# Patient Record
Sex: Male | Born: 1935 | Race: White | Hispanic: No | Marital: Married | Smoking: Former smoker
Health system: Southern US, Community
[De-identification: ages and names within clinical notes are randomized; demographics above are authoritative.]

## PROBLEM LIST (undated history)

## (undated) DIAGNOSIS — Z974 Presence of external hearing-aid: Secondary | ICD-10-CM

## (undated) DIAGNOSIS — E785 Hyperlipidemia, unspecified: Secondary | ICD-10-CM

## (undated) DIAGNOSIS — J869 Pyothorax without fistula: Secondary | ICD-10-CM

## (undated) DIAGNOSIS — J189 Pneumonia, unspecified organism: Secondary | ICD-10-CM

## (undated) HISTORY — DX: Pneumonia, unspecified organism: J18.9

## (undated) HISTORY — DX: Pyothorax without fistula: J86.9

## (undated) HISTORY — DX: Presence of external hearing-aid: Z97.4

## (undated) HISTORY — PX: EYE SURGERY: SHX253

## (undated) SURGERY — Surgical Case
Anesthesia: *Unknown

---
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
## 1969-07-19 HISTORY — PX: APPENDECTOMY: SHX54

## 2006-07-19 HISTORY — PX: HERNIA REPAIR: SHX51

## 2009-06-04 ENCOUNTER — Ambulatory Visit: Payer: Self-pay | Admitting: Unknown Physician Specialty

## 2012-07-19 HISTORY — PX: COLONOSCOPY: SHX174

## 2012-07-27 ENCOUNTER — Ambulatory Visit: Payer: Self-pay | Admitting: Ophthalmology

## 2012-08-07 ENCOUNTER — Ambulatory Visit: Payer: Self-pay | Admitting: Ophthalmology

## 2014-11-08 NOTE — Op Note (Signed)
PATIENT NAME:  Jeffery [name], Jeffery [name] MR#:  [id number] DATE OF BIRTH:  Mar 22, 1931  DATE OF PROCEDURE:  08/07/2012  PREOPERATIVE DIAGNOSIS: Cataract, left eye.   POSTOPERATIVE DIAGNOSIS: Cataract, left eye.   PROCEDURE PERFORMED: Extracapsular cataract extraction using phacoemulsification with placement of an Alcon [id number] 16.5-diopter posterior chamber lens with 3 diopters a cylinder, serial number [id number].   SURGEON: Loura Back. Chelisa Hennen, MD   ANESTHESIA: 4% lidocaine and 0.75% Marcaine in a 50:50 mixture with 10 units/mL of Hylenex added, given as a peribulbar.   ANESTHESIOLOGIST: Dr. [place] Service   COMPLICATIONS: None.   ESTIMATED BLOOD LOSS: Less than 1 mL.   DESCRIPTION OF PROCEDURE: The patient was brought to the Operating Room. Both eyes were anesthetized with topical proparacaine, and the patient was set upright. With the patient fixing at a distant target, the 3 o'clock and 9 o'clock positions were marked on the left eye using an [employer] Toric Marker. This was reinforced using a marking pen. The patient was placed supine, given IV sedation and a peribulbar block. He was then prepped and draped in the usual fashion. Vertical rectus muscles were imbricated using 5-0 silk sutures, bridle sutures. The Toric Marker was brought to the table and had been set at 161 degrees. This was centered over the 3 o'clock and 9 o'clock positional marks, and the axis of placement of the lens was marked. The 100-degree mark was also marked for the incision. A partial thickness scleral groove was then made at the posterior surgical limbus centered on 100 degrees, and this was dissected anteriorly into clear cornea with an [employer]. The anterior chamber was entered superonasally through clear cornea with a paracentesis knife and through the lamellar dissection with a 2.6 mm keratome. DisCoVisc was used to replace the aqueous, and a continuous tear circular capsulorrhexis was carried out. Hydrodissection  was used to loosen the nucleus, and phacoemulsification was carried out in a divide and conquer technique. Total ultrasound time was 2 minutes and 9 seconds, an average power of 10.4%, CDE 47.28. Irrigation-aspiration was used to remove the residual cortex. The capsular bag was then inflated with DisCoVisc, and the intraocular lens was inserted in the capsular bag under visualization. The marks on the optic at the base of the haptics were rotated to bring the midline with the 161-degree mark. Irrigation-aspiration was used to remove the residual DisCoVisc at that point. The wound was inflated with balanced salt and Miostat injected in the paracentesis tract. There was a mild leak at the wound, so a single 10-0 nylon suture was placed across the wound. Cefuroxime, 0.1 mL, was injected into the anterior chamber through the paracentesis tract. The wounds were checked for leaks and none were found. The conjunctiva was then closed with cautery. The bridle sutures were removed, and 2 drops of Vigamox were placed in the eye. A shield was placed on the eye. The patient was discharged to the recovery room in good condition.   ____________________________ Loura Back Shardai Star, MD sad:cb D: 08/07/2012 13:36:50 ET T: 08/07/2012 14:04:04 ET JOB#: [id number]  cc: Remo Lipps A. Marolyn Urschel, MD, <Dictator> Martie Lee MD ELECTRONICALLY SIGNED 08/14/2012 13:18

## 2015-04-01 DIAGNOSIS — R011 Cardiac murmur, unspecified: Secondary | ICD-10-CM | POA: Insufficient documentation

## 2015-04-01 DIAGNOSIS — E782 Mixed hyperlipidemia: Secondary | ICD-10-CM | POA: Insufficient documentation

## 2016-02-25 DIAGNOSIS — M25519 Pain in unspecified shoulder: Secondary | ICD-10-CM | POA: Insufficient documentation

## 2016-02-25 DIAGNOSIS — M7540 Impingement syndrome of unspecified shoulder: Secondary | ICD-10-CM | POA: Insufficient documentation

## 2016-03-24 ENCOUNTER — Other Ambulatory Visit: Payer: Self-pay | Admitting: Internal Medicine

## 2016-03-24 DIAGNOSIS — R0989 Other specified symptoms and signs involving the circulatory and respiratory systems: Secondary | ICD-10-CM

## 2016-03-26 ENCOUNTER — Ambulatory Visit
Admission: RE | Admit: 2016-03-26 | Discharge: 2016-03-26 | Disposition: A | Discharge status: Home / Self Care | Payer: Medicare Other | Source: Ambulatory Visit | Attending: Internal Medicine | Admitting: Internal Medicine

## 2016-03-26 DIAGNOSIS — R0989 Other specified symptoms and signs involving the circulatory and respiratory systems: Secondary | ICD-10-CM | POA: Diagnosis present

## 2016-03-26 DIAGNOSIS — I6523 Occlusion and stenosis of bilateral carotid arteries: Secondary | ICD-10-CM | POA: Diagnosis not present

## 2016-04-27 ENCOUNTER — Other Ambulatory Visit: Payer: Self-pay | Admitting: Internal Medicine

## 2016-04-27 ENCOUNTER — Ambulatory Visit
Admission: RE | Admit: 2016-04-27 | Discharge: 2016-04-27 | Disposition: A | Discharge status: Home / Self Care | Payer: Medicare Other | Source: Ambulatory Visit | Attending: Internal Medicine | Admitting: Internal Medicine

## 2016-04-27 DIAGNOSIS — R05 Cough: Secondary | ICD-10-CM | POA: Insufficient documentation

## 2016-04-27 DIAGNOSIS — J9811 Atelectasis: Secondary | ICD-10-CM | POA: Insufficient documentation

## 2016-04-27 DIAGNOSIS — M47814 Spondylosis without myelopathy or radiculopathy, thoracic region: Secondary | ICD-10-CM | POA: Diagnosis not present

## 2016-04-27 DIAGNOSIS — R918 Other nonspecific abnormal finding of lung field: Secondary | ICD-10-CM | POA: Diagnosis not present

## 2016-04-27 DIAGNOSIS — R059 Cough, unspecified: Secondary | ICD-10-CM

## 2016-05-11 ENCOUNTER — Ambulatory Visit
Admission: RE | Admit: 2016-05-11 | Discharge: 2016-05-11 | Disposition: A | Discharge status: Home / Self Care | Payer: Medicare Other | Source: Ambulatory Visit | Attending: Internal Medicine | Admitting: Internal Medicine

## 2016-05-11 ENCOUNTER — Other Ambulatory Visit: Payer: Self-pay | Admitting: Internal Medicine

## 2016-05-11 DIAGNOSIS — R05 Cough: Secondary | ICD-10-CM | POA: Insufficient documentation

## 2016-05-11 DIAGNOSIS — J984 Other disorders of lung: Secondary | ICD-10-CM | POA: Diagnosis not present

## 2016-05-11 DIAGNOSIS — R059 Cough, unspecified: Secondary | ICD-10-CM

## 2016-05-25 ENCOUNTER — Other Ambulatory Visit: Payer: Self-pay | Admitting: Internal Medicine

## 2016-05-25 ENCOUNTER — Ambulatory Visit
Admission: RE | Admit: 2016-05-25 | Discharge: 2016-05-25 | Disposition: A | Discharge status: Home / Self Care | Payer: Medicare Other | Source: Ambulatory Visit | Attending: Internal Medicine | Admitting: Internal Medicine

## 2016-05-25 DIAGNOSIS — J90 Pleural effusion, not elsewhere classified: Secondary | ICD-10-CM | POA: Diagnosis not present

## 2016-05-25 DIAGNOSIS — R059 Cough, unspecified: Secondary | ICD-10-CM

## 2016-05-25 DIAGNOSIS — J988 Other specified respiratory disorders: Secondary | ICD-10-CM | POA: Insufficient documentation

## 2016-05-25 DIAGNOSIS — R05 Cough: Secondary | ICD-10-CM

## 2016-05-31 NOTE — Progress Notes (Signed)
[facility] Consultation      Assessment and Plan:  Empyema. -View chest x-ray images with patient and his wife. -There is residual pleural fluid, which may be empyema. -We will take the patient to special procedures and perform ultrasound, if there is significant amount of fluid, we will perform a thoracentesis  Pleural effusion. -As above. Drain pleural effusion, if present.  Dyspnea. -Dyspnea, likely secondary to restriction from the pleural effusion. -Pleuritic chest pain likely secondary to effusion.  Pneumonia. -This appears to have improved, I suspect that this was the inciting event for the pleural effusion.   Date: 05/31/2016  MRN# [id number] ARJAN STANTON 20-Sep-1930  Referring Physician:   STATLER BROAS is a 82 y.o. old male seen in consultation for chief complaint of:    Chief Complaint  Patient presents with  . pulmonary consult    per Dr. Hall Busing. pt dx with PNA about 8 weeks ago, pt had has three rounds of abx and 1 round of prednisone. pt currently c/o sob and voice hoarseness     HPI:   Dimitrius Cottam is an 82 yo male. About 8 weeks ago he had a lot of severe coughing and right sided lateral chest pain. He is here today with his wife who gives some of the history. They called docs office and had a home visit. He was diagnosed with pneumonia, he was given a course of abx, then had a CXR which was apparently clear. He was given a cough syrup which made him hallucinate.  Images reviewed: 05/24/16; small to moderate right pleural effusion with atelectasis, not seen previously.  He has since been taking vertussin AC, mucinex, and bactrim. He notes that he continues to feel week, but the coughing is better. The pain in the side has improved.   He is retired from working in [employer], last worked about 24 years ago; he was a Personnel officer.   PMHX:   Past Medical History:  Diagnosis Date  . Pneumonia    Surgical Hx:  Past Surgical History:    Procedure Laterality Date  . APPENDECTOMY    . TONSILLECTOMY     Family Hx:  Family History  Problem Relation Age of Onset  . Heart attack Father   . ALS Sister   . Cancer Sister   . ALS Sister    Social Hx:   Social History  Substance Use Topics  . Smoking status: Not on file  . Smokeless tobacco: Not on file  . Alcohol use Not on file   Medication:       Allergies:  Patient has no allergy information on record.  Review of Systems: Gen:  Denies  fever, sweats, chills HEENT: Denies blurred vision, double vision. bleeds, sore throat Cvc:  No dizziness, chest pain. Resp:   Denies cough or sputum production, shortness of breath Gi: Denies swallowing difficulty, stomach pain. Gu:  Denies bladder incontinence, burning urine Ext:   No Joint pain, stiffness. Skin: No skin rash,  hives  Endoc:  No polyuria, polydipsia. Psych: No depression, insomnia. Other:  All other systems were reviewed with the patient and were negative other that what is mentioned in the HPI.   Physical Examination:   VS: BP 124/60 (BP Location: Left Arm, Cuff Size: Normal)   Pulse (!) 116   Ht 5\' 10"  (1.778 m)   Wt 117 lb 3.2 oz (53.2 kg)   SpO2 93%   BMI 16.82 kg/m   General Appearance: No distress  Neuro:without focal findings,  speech normal,  HEENT: PERRLA, EOM intact.   Pulmonary: normal breath sounds, decreased air entry in the right base, with dullness to percussion in the right base. CardiovascularNormal S1,S2.  No m/r/g.   Abdomen: Benign, Soft, non-tender. Renal:  No costovertebral tenderness  GU:  No performed at this time. Endoc: No evident thyromegaly, no signs of acromegaly. Skin:   warm, no rashes, no ecchymosis  Extremities: normal, no cyanosis, clubbing.  Other findings:    LABORATORY PANEL:   CBC No results for input(s): WBC, HGB, HCT, PLT in the last 168  hours. ------------------------------------------------------------------------------------------------------------------  Chemistries  No results for input(s): NA, K, CL, CO2, GLUCOSE, BUN, CREATININE, CALCIUM, MG, AST, ALT, ALKPHOS, BILITOT in the last 168 hours.  Invalid input(s): GFRCGP ------------------------------------------------------------------------------------------------------------------  Cardiac Enzymes No results for input(s): TROPONINI in the last 168 hours. ------------------------------------------------------------  RADIOLOGY:  No results found.     Thank  you for the consultation and for allowing [facility] to assist in the care of your patient. Our recommendations are noted above.  Please contact us if we can be of further service.   [name], MD.  Board Certified in Internal Medicine, Pulmonary Medicine, [place], and Sleep Medicine.  [facility] Office Number: [phone]  Patricia Pesa, M.D.  Vilinda Boehringer, M.D.  Merton Border, M.D  05/31/2016

## 2016-06-01 ENCOUNTER — Encounter (INDEPENDENT_AMBULATORY_CARE_PROVIDER_SITE_OTHER): Payer: Self-pay

## 2016-06-01 ENCOUNTER — Encounter: Payer: Self-pay | Admitting: Internal Medicine

## 2016-06-01 ENCOUNTER — Ambulatory Visit (INDEPENDENT_AMBULATORY_CARE_PROVIDER_SITE_OTHER): Payer: Medicare Other | Admitting: Internal Medicine

## 2016-06-01 VITALS — BP 124/60 | HR 116 | Ht 70.0 in | Wt 117.2 lb

## 2016-06-01 DIAGNOSIS — J869 Pyothorax without fistula: Secondary | ICD-10-CM | POA: Diagnosis not present

## 2016-06-01 NOTE — Patient Instructions (Addendum)
--  Will schedule diagnostic/therapeutic thoracentesis in special procedures.

## 2016-06-03 ENCOUNTER — Other Ambulatory Visit: Payer: Self-pay | Admitting: Internal Medicine

## 2016-06-03 ENCOUNTER — Ambulatory Visit
Admission: RE | Admit: 2016-06-03 | Discharge: 2016-06-03 | Disposition: A | Discharge status: Home / Self Care | Payer: Medicare Other | Source: Ambulatory Visit | Attending: Internal Medicine | Admitting: Internal Medicine

## 2016-06-03 ENCOUNTER — Telehealth: Payer: Self-pay | Admitting: *Deleted

## 2016-06-03 ENCOUNTER — Encounter: Payer: Self-pay | Admitting: Internal Medicine

## 2016-06-03 DIAGNOSIS — Z9889 Other specified postprocedural states: Secondary | ICD-10-CM

## 2016-06-03 DIAGNOSIS — J90 Pleural effusion, not elsewhere classified: Secondary | ICD-10-CM | POA: Diagnosis present

## 2016-06-03 DIAGNOSIS — J869 Pyothorax without fistula: Secondary | ICD-10-CM | POA: Diagnosis not present

## 2016-06-03 HISTORY — DX: Pyothorax without fistula: J86.9

## 2016-06-03 LAB — BASIC METABOLIC PANEL
Anion gap: 7 (ref 5–15)
BUN: 8 mg/dL (ref 6–20)
CHLORIDE: 98 mmol/L — AB (ref 101–111)
CO2: 28 mmol/L (ref 22–32)
CREATININE: 0.72 mg/dL (ref 0.61–1.24)
Calcium: 9.2 mg/dL (ref 8.9–10.3)
GFR calc non Af Amer: >60 mL/min (ref >60)
Glucose, Bld: 104 mg/dL — ABNORMAL HIGH (ref 65–99)
POTASSIUM: 4.3 mmol/L (ref 3.5–5.1)
Sodium: 133 mmol/L — ABNORMAL LOW (ref 135–145)

## 2016-06-03 LAB — CBC
HEMATOCRIT: 38.2 % — AB (ref 40.0–52.0)
HEMOGLOBIN: 12.7 g/dL — AB (ref 13.0–18.0)
MCH: 28.2 pg (ref 26.0–34.0)
MCHC: 33.2 g/dL (ref 32.0–36.0)
MCV: 85 fL (ref 80.0–100.0)
Platelets: 430 10*3/uL (ref 150–440)
RBC: 4.5 MIL/uL (ref 4.40–5.90)
RDW: 13.7 % (ref 11.5–14.5)
WBC: 13.1 10*3/uL — ABNORMAL HIGH (ref 3.8–10.6)

## 2016-06-03 LAB — LIPID PANEL
CHOLESTEROL: 155 mg/dL (ref 0–200)
HDL: 34 mg/dL — ABNORMAL LOW (ref >40)
LDL Cholesterol: 108 mg/dL — ABNORMAL HIGH (ref 0–99)
TRIGLYCERIDES: 64 mg/dL (ref <150)
Total CHOL/HDL Ratio: 4.6 RATIO
VLDL: 13 mg/dL (ref 0–40)

## 2016-06-03 LAB — BODY FLUID CELL COUNT WITH DIFFERENTIAL
Eos, Fluid: 0 %
Lymphs, Fluid: 40 %
Monocyte-Macrophage-Serous Fluid: 4 %
Neutrophil Count, Fluid: 54 %
Other Cells, Fluid: 2 %
Total Nucleated Cell Count, Fluid: 53103 cu mm

## 2016-06-03 LAB — AMYLASE: Amylase: 48 U/L (ref 28–100)

## 2016-06-03 LAB — LACTATE DEHYDROGENASE, PLEURAL OR PERITONEAL FLUID: LD FL: 3067 U/L — AB (ref 3–23)

## 2016-06-03 MED ORDER — LACTATED RINGERS IV SOLN
INTRAVENOUS | Status: DC
  Administered 2016-06-03: 13:00:00 via INTRAVENOUS

## 2016-06-03 NOTE — Telephone Encounter (Signed)
LMOVM for pt to call back to schedule OV f/u with CXR prior.

## 2016-06-03 NOTE — OR Nursing (Signed)
Patient reports feeling better and breathing better after fluid was drained off.

## 2016-06-03 NOTE — Telephone Encounter (Signed)
-----   Message from Laverle Hobby, MD sent at 06/03/2016  2:40 PM EST ----- Regarding: fu on thoracentesis.  Pt needs CXR and follow up from his thoracentesis, both in 2-3 weeks.

## 2016-06-03 NOTE — OR Nursing (Signed)
Thoracentesis right chest performed by Dr. Ashby Dawes with removall of 100 mls milky tan frothy drainage. Patient tolerated the procedure well. Specimens sent to lab. Portable CXR performed and reported to be cleared by Dr. Ashby Dawes. He has cleared patient for discharge to home.

## 2016-06-03 NOTE — Progress Notes (Signed)
Pre-op Chest ultrasound: Chest ultrasound was performed on the posterior hemithorax in mid scapular line, there was moderate pleural effusion noted with significant loculations. The effusion extended 2/3 way up the chest wall.  An are in the mid scapular line approximately in the 9th interspace was identified. This area was marked, prepped and draped in sterile fashion.   PROCEDURE NOTE: THORACENTESIS   Indication: Pleural effusion/ Empyema  A time-out was completed verifying correct patient, procedure, site, positioning, and implant(s) or special equipment if applicable.  Ultrasound guidance was  used and appropriate fluid pocket was identified and marked.   Patient was positioned, prepped and draped in usual sterile fashion. 1% Lidocaine was used to anesthetize the area and infiltrated down to the pleural space, pleural fluid was seen upon aspiration.  An introducer needle was introduced into the pleural space and fluid was removed as the catheter was advanced Blood loss was none.  Approximately 150 cc of grossly thick purulent fluid was removed until drainage stopped. Fluid was sent for testing, the patient noted immediate improvement in his breathing.   A chest xray was ordered to evaluate for pneumothorax.    Patient tolerated the procedure well  and there were no complications noted. The patient was comfortable for the duration of the procedure, was awake, alert, conversant.   [name], M.D. Pulmonary & Critical Care Medicine

## 2016-06-04 LAB — PATHOLOGIST SMEAR REVIEW

## 2016-06-04 NOTE — Telephone Encounter (Signed)
appt scheduled with DS since DR not available. Pt and wife informed and that CXR is needed.

## 2016-06-08 LAB — BODY FLUID CULTURE: Special Requests: NORMAL

## 2016-06-08 LAB — LIPASE, FLUID: LIPASE-FLUID: 16 U/L

## 2016-06-09 NOTE — Procedures (Signed)
PROCEDURE NOTE: THORACENTESIS with ultrasound guidance.   Chest Ultrasound:  A chest ultrasound was performed in mid scapular line, from the base to the apex bilaterally. There was a loculated pleural effusion on the right side approximately 2/3 rds way up the chest wall. The left lung was clear. An area in the mid scapular line in approximately the 9th interspace was identified as most ideal this was marked, prepped and draped in sterile fashin.   MRN# [id number] Jeffery [name]   Preop diagnosis: Loculated pleural effusion Postop diagnosis: Empyema.   Indication: Pleural effusion/diagnsyic/therapeutic/ Empyema  A time-out was completed verifying correct patient, procedure, site, positioning, and implant(s) or special equipment if applicable.  Ultrasound guidance was  used and appropriate fluid pocket was identified and marked.   Patient was positioned, prepped and draped in usual sterile fashion. 1% Lidocaine was used to anesthetize the area.  A  needle was introduced into the pleural space and approximately 150 cc of dark thick fluid was removed. Blood loss was nil.   A chest xray was ordered to evaluate for pneumothorax.  Total Fluid Removed: 150cc Color of Fluid: brown, turbid.  Sent for: Cell Count Gram Stain Cultures LDH Glucose pH Cytology  Patient tolerated the procedure well and there were no complications.  [name], M.D. Pulmonary & Critical Care Medicine 06/07/16

## 2016-06-17 ENCOUNTER — Ambulatory Visit
Admission: RE | Admit: 2016-06-17 | Discharge: 2016-06-17 | Disposition: A | Discharge status: Home / Self Care | Payer: Medicare Other | Source: Ambulatory Visit | Attending: Pulmonary Disease | Admitting: Pulmonary Disease

## 2016-06-17 ENCOUNTER — Ambulatory Visit
Admission: RE | Admit: 2016-06-17 | Discharge: 2016-06-17 | Disposition: A | Discharge status: Home / Self Care | Payer: Medicare Other | Source: Ambulatory Visit | Attending: Diagnostic Radiology | Admitting: Diagnostic Radiology

## 2016-06-17 ENCOUNTER — Other Ambulatory Visit: Payer: Self-pay | Admitting: *Deleted

## 2016-06-17 ENCOUNTER — Inpatient Hospital Stay
Admission: RE | Admit: 2016-06-17 | Discharge: 2016-06-22 | DRG: 177 | Disposition: A | Discharge status: Home Health | Payer: Medicare Other | Source: Ambulatory Visit | Attending: Internal Medicine | Admitting: Internal Medicine

## 2016-06-17 ENCOUNTER — Other Ambulatory Visit: Payer: Self-pay | Admitting: Pulmonary Disease

## 2016-06-17 ENCOUNTER — Ambulatory Visit (INDEPENDENT_AMBULATORY_CARE_PROVIDER_SITE_OTHER): Payer: Medicare Other | Admitting: Pulmonary Disease

## 2016-06-17 ENCOUNTER — Encounter: Payer: Self-pay | Admitting: Pulmonary Disease

## 2016-06-17 ENCOUNTER — Encounter: Payer: Self-pay | Admitting: Critical Care Medicine

## 2016-06-17 ENCOUNTER — Inpatient Hospital Stay: Admission: AD | Admit: 2016-06-17 | Payer: Medicare Other | Source: Ambulatory Visit

## 2016-06-17 VITALS — BP 126/74 | HR 107 | Ht 70.0 in | Wt 173.6 lb

## 2016-06-17 DIAGNOSIS — J90 Pleural effusion, not elsewhere classified: Secondary | ICD-10-CM

## 2016-06-17 DIAGNOSIS — Z87891 Personal history of nicotine dependence: Secondary | ICD-10-CM

## 2016-06-17 DIAGNOSIS — J939 Pneumothorax, unspecified: Secondary | ICD-10-CM

## 2016-06-17 DIAGNOSIS — J869 Pyothorax without fistula: Secondary | ICD-10-CM | POA: Diagnosis present

## 2016-06-17 DIAGNOSIS — J438 Other emphysema: Secondary | ICD-10-CM

## 2016-06-17 DIAGNOSIS — J13 Pneumonia due to Streptococcus pneumoniae: Secondary | ICD-10-CM | POA: Diagnosis present

## 2016-06-17 HISTORY — DX: Hyperlipidemia, unspecified: E78.5

## 2016-06-17 LAB — BODY FLUID CELL COUNT WITH DIFFERENTIAL
Eos, Fluid: 0 %
Lymphs, Fluid: 56 %
Monocyte-Macrophage-Serous Fluid: 18 %
NEUTROPHIL FLUID: 26 %
OTHER CELLS FL: 0 %
Total Nucleated Cell Count, Fluid: 299580 cu mm

## 2016-06-17 LAB — LACTATE DEHYDROGENASE, PLEURAL OR PERITONEAL FLUID

## 2016-06-17 LAB — PROTEIN, BODY FLUID: Total protein, fluid: 4.3 g/dL

## 2016-06-17 LAB — AMYLASE: AMYLASE: 36 U/L (ref 28–100)

## 2016-06-17 LAB — GLUCOSE, SEROUS FLUID: Glucose, Fluid: <20 mg/dL

## 2016-06-17 MED ORDER — IOPAMIDOL (ISOVUE-300) INJECTION 61%
75.0000 mL | Freq: Once | INTRAVENOUS | Status: AC | PRN
  Administered 2016-06-17: 75 mL via INTRAVENOUS

## 2016-06-17 MED ORDER — AMOXICILLIN-POT CLAVULANATE 875-125 MG PO TABS: 1.0000 | ORAL_TABLET | Freq: Two times a day (BID) | ORAL | 0 refills | Status: DC

## 2016-06-17 MED ORDER — TRAZODONE HCL 50 MG PO TABS
25.0000 mg | ORAL_TABLET | Freq: Every day | ORAL | Status: DC
  Administered 2016-06-17 – 2016-06-21 (×5): 25 mg via ORAL
  Filled 2016-06-17 (×5): qty 1

## 2016-06-17 MED ORDER — ACETAMINOPHEN 325 MG PO TABS
650.0000 mg | ORAL_TABLET | Freq: Four times a day (QID) | ORAL | Status: DC | PRN
  Administered 2016-12-01: 1000 mg via ORAL

## 2016-06-17 MED ORDER — ACETAMINOPHEN 325 MG PO TABS
650.0000 mg | ORAL_TABLET | ORAL | Status: DC | PRN
  Administered 2016-06-18: 01:00:00 650 mg via ORAL
  Filled 2016-06-17: qty 2

## 2016-06-17 MED ORDER — ENOXAPARIN SODIUM 40 MG/0.4ML ~~LOC~~ SOLN
40.0000 mg | SUBCUTANEOUS | Status: DC
  Administered 2016-06-19 – 2016-06-22 (×4): 40 mg via SUBCUTANEOUS
  Filled 2016-06-17 (×4): qty 0.4

## 2016-06-17 MED ORDER — ONDANSETRON HCL 4 MG/2ML IJ SOLN
4.0000 mg | Freq: Four times a day (QID) | INTRAMUSCULAR | Status: DC | PRN
Start: 2016-06-17 — End: 2017-06-18

## 2016-06-17 MED ORDER — ONDANSETRON HCL 4 MG/2ML IJ SOLN: 4.0000 mg | Freq: Four times a day (QID) | INTRAMUSCULAR | Status: DC | PRN

## 2016-06-17 MED ORDER — DEXTROSE 5 % IV SOLN
5.0000 10*6.[IU] | Freq: Four times a day (QID) | INTRAVENOUS | Status: DC
  Filled 2016-06-17 (×3): qty 5

## 2016-06-17 MED ORDER — PENICILLIN G POTASSIUM 5000000 UNITS IJ SOLR
5.0000 10*6.[IU] | Freq: Four times a day (QID) | INTRAVENOUS | Status: DC
  Administered 2016-06-18 – 2016-06-22 (×17): 5 10*6.[IU] via INTRAVENOUS
  Filled 2016-06-17 (×20): qty 5

## 2016-06-17 MED ORDER — GUAIFENESIN-DM 100-10 MG/5ML PO SYRP
5.0000 mL | ORAL_SOLUTION | ORAL | Status: DC | PRN
  Administered 2016-06-18: 5 mL via ORAL
  Filled 2016-06-17: qty 5

## 2016-06-17 NOTE — H&P (Signed)
Name: Jeffery [name] MRN: [id number] DOB: 15-Mar-1931    ADMISSION DATE:  06/17/2016 CONSULTATION DATE:  06/17/2016  REFERRING MD :  Dr. Anselm Pancoast  CHIEF COMPLAINT:  Right sided empyema  BRIEF PATIENT DESCRIPTION: 82 yo male presented to [facility] on 11/30 for a right sided thoracentesis due to a large posterior loculated pleural effusion with the removal of 100 ml brown purulent fluid. However, due to the inability to drain the right sided pleural effusion thoroughly he will need CT biopsy with placement of drainage tube or catheter placement with instillation of thrombolytic agents.   SIGNIFICANT EVENTS  11/30-US Guided Thoracentesis for right sided empyema with removal of 100 ml brown purulent fluid 11/30-Pt admitted to [facility] due to need of CT biopsy for right sided chest tube placement with instillation of thrombolytic agents   STUDIES:  CT Chest 11/30>>Large posterior loculated pleural effusion on the right measures 11 x 6 cm compatible with empyema. Small gas bubble likely due to recent thoracentesis. Compressive atelectasis/ infiltrate in the right lower lobe with a small amount of infiltrate in the right upper lobe. 1 cm patchy density left lower lobe posteriorly may represent scar or infiltrate. Mild splenomegaly. Coronary artery calcification. No definite underlying mass lesion identified.  HISTORY OF PRESENT ILLNESS:   This is an 82 yo male with a PMH of Hyperlipidemia and Pneumonia.  He was referred by Dr. Hall Busing to Pulmonologist Dr. Ashby Dawes for a consultation on 11/14 due to Pneumonia diagnosis 8 weeks prior to outpatient pulmonology consultation .  The pt had a total of three rounds of abx and 1 round of prednisone total due to c/o persistent cough and dyspnea, however he still c/o shortness of breath and voice hoarseness despite treatments prompting pulmonology consultation.  Chest xray on 11/6 revealed small to moderate right pleural effusion with atelectasis that was not seen on  previous xrays.  There was suspicion of a right sided empyema, therefore Dr. Ashby Dawes recommended a right sided thoracentesis.  The thoracentesis was performed on 11/16 with the removal of 150 ml of dark thick fluid, and following procedure an outpatient follow-up appointment was scheduled with pulmonology on 11/30. During follow-up appointment with Pulmonologist Dr. Alva Garnet on 11/30 a CT Chest was ordered that revealed a large posterior loculated pleural effusion, therefore a repeat right sided thoracentesis was performed by IR on 11/30 with drainage of 100 ml brown purulent fluid.  However, due to the inability to drain the right sided pleural effusion thoroughly the pt subsequently required admission to [facility] for a CT biopsy with placement of a right sided drainage tube or catheter placement with plans for instillation of thrombolytic agents (procedure planned for 12/1).    PAST MEDICAL HISTORY :   has a past medical history of Hyperlipidemia and Pneumonia.  has a past surgical history that includes Appendectomy and Tonsillectomy. Prior to Admission medications   Medication Sig Start Date End Date Taking? Authorizing Provider  amoxicillin-clavulanate (AUGMENTIN) 875-125 MG tablet Take 1 tablet by mouth 2 (two) times daily. 06/17/16 07/01/16  Wilhelmina Mcardle, MD  guaiFENesin (MUCINEX) 600 MG 12 hr tablet Take by mouth 2 (two) times daily.    Historical Provider, MD  Selena Lesser A/C 100-10 MG/5ML syrup  05/18/16   Historical Provider, MD   No Known Allergies  FAMILY HISTORY:  family history includes ALS in his sister and sister; Cancer in his sister; Heart attack in his father. SOCIAL HISTORY:  reports that he has quit smoking. His smoking use included Cigarettes. He  has a 10.00 pack-year smoking history. He has never used smokeless tobacco. He reports that he drinks alcohol. He reports that he does not use drugs.  REVIEW OF SYSTEMS:  Positives in BOLD Constitutional: Negative for fever, chills,  weight loss, malaise/fatigue and diaphoresis.  HENT: Negative for hearing loss, ear pain, nosebleeds, congestion, sore throat, neck pain, tinnitus and ear discharge.   Eyes: Negative for blurred vision, double vision, photophobia, pain, discharge and redness.  Respiratory: Negative for cough, hemoptysis, sputum production, shortness of breath, wheezing and stridor.   Cardiovascular: Negative for chest pain, palpitations, orthopnea, claudication, leg swelling and PND.  Gastrointestinal: Negative for heartburn, nausea, vomiting, abdominal pain, diarrhea, constipation, blood in stool and melena.  Genitourinary: Negative for dysuria, urgency, frequency, hematuria and flank pain.  Musculoskeletal: Negative for myalgias, back pain, joint pain and falls.  Skin: Negative for itching and rash.  Neurological: Negative for dizziness, tingling, tremors, sensory change, speech change, focal weakness, seizures, loss of consciousness, weakness and headaches.  Endo/Heme/Allergies: Negative for environmental allergies and polydipsia. Does not bruise/bleed easily.  SUBJECTIVE:  Pt currently states he feels great does not have any complaints at this time.  VITAL SIGNS: Temp:  [97.9 F (36.6 C)-99.4 F (37.4 C)] 99.4 F (37.4 C) (11/30 1958) Pulse Rate:  [105-121] 121 (11/30 1958) Resp:  [18-20] 18 (11/30 1958) BP: (109-135)/(64-90) 119/65 (11/30 1958) SpO2:  [95 %-97 %] 96 % (11/30 1958) Weight:  [169 lb 8 oz (76.9 kg)-173 lb 9.6 oz (78.7 kg)] 169 lb 8 oz (76.9 kg) (11/30 1958)  PHYSICAL EXAMINATION: General:  Well developed, well nourished Caucasian male  Neuro:  Alert and oriented, follows commands, PERRLA HEENT:  Supple, no JVD Cardiovascular: Sinus tach, s1s2, no M/R/G Lungs:  Diminished right side, clear on left side, even, non labored Abdomen: +BS x4, soft, non tender, non distended Musculoskeletal:  Normal bulk and tone  Skin:  No rashes or lesions  No results for input(s): NA, K, CL, CO2,  BUN, CREATININE, GLUCOSE in the last 168 hours. No results for input(s): HGB, HCT, WBC, PLT in the last 168 hours. Dg Chest 1 View  Result Date: 06/17/2016 CLINICAL DATA:  Followup right thoracentesis. EXAM: CHEST 1 VIEW COMPARISON:  Earlier same day FINDINGS: Heart size is normal. Left chest remains clear. Similar radiographic appearance of right lower chest density. No pneumothorax. IMPRESSION: Persistent right chest density on this one view, little changed. No pneumothorax. Electronically Signed   By: Nelson Chimes M.D.   On: 06/17/2016 16:41   Dg Chest 2 View  Result Date: 06/17/2016 CLINICAL DATA:  Status post thoracentesis 2 weeks ago. Patient has diagnosis of pneumonia. Patient reports fairly stable shortness of breath and other symptoms. Remote history of smoking. EXAM: CHEST  2 VIEW COMPARISON:  Post thoracentesis radiograph of June 03, 2016 and pre thoracentesis PA and lateral chest x-ray of May 25, 2016 FINDINGS: The right lower lobe is completely opacified. The right middle and upper lobes are grossly clear. The left lung is clear. There is no left-sided pleural effusion. The heart and pulmonary vascularity are normal. The mediastinum is normal in width. The bony thorax is unremarkable. IMPRESSION: Probable interval reaccumulation of large right pleural effusion. This could be verified with directed ultrasound. Density throughout the right lower lobe is consistent with atelectasis or pneumonia. These results will be called to the ordering clinician or representative by the Radiologist Assistant, and communication documented in the PACS or zVision Dashboard. Electronically Signed   By: Calvin Chura  [place] M.D.  On: 06/17/2016 11:20   Ct Chest W Contrast  Result Date: 06/17/2016 CLINICAL DATA:  Pneumonia, empyema EXAM: CT CHEST WITH CONTRAST TECHNIQUE: Multidetector CT imaging of the chest was performed during intravenous contrast administration. CONTRAST:  69mL ISOVUE-300 IOPAMIDOL  (ISOVUE-300) INJECTION 61% COMPARISON:  Chest two-view 06/17/2016 FINDINGS: Cardiovascular: Thoracic aorta within normal limits. Pulmonary arteries normal. Coronary artery calcification. Aortic valve calcification. Negative for pericardial effusion. Mediastinum/Nodes: No pathologic adenopathy. Fluid in the superior pericardial recess noted. Lungs/Pleura: Large posterior loculated pleural effusion on the right measuring 11 x 6 cm. Small gas bubble within the fluid may be related to thoracentesis. There is compressive atelectasis/ infiltrate in the right lower lobe. Small amount of infiltrate is present in the right upper lobe posteriorly and laterally. Subpleural patchy density left lower lobe measuring under 1 cm, possibly an area of scar or infiltrate. No pleural effusion on the left Upper Abdomen: Small low-density lesions in the left lobe liver possibly cysts. Low-density in the lateral left liver adjacent the falciform ligament likely due to fatty liver. Mild splenomegaly. 1 cm right renal stone. Musculoskeletal: Moderately severe compression fracture L1 appears chronic. No acute skeletal abnormality. IMPRESSION: Large posterior loculated pleural effusion on the right measures 11 x 6 cm compatible with empyema. Small gas bubble likely due to recent thoracentesis. Compressive atelectasis/ infiltrate in the right lower lobe with a small amount of infiltrate in the right upper lobe. 1 cm patchy density left lower lobe posteriorly may represent scar or infiltrate. Mild splenomegaly Coronary artery calcification No definite underlying mass lesion identified. Electronically Signed   By: Franchot Gallo M.D.   On: 06/17/2016 12:09   US Thoracentesis Asp Pleural Space W/img Guide  Result Date: 06/17/2016 INDICATION: 82 year old with right pleural effusion, bacterial. EXAM: ULTRASOUND GUIDED RIGHT THORACENTESIS MEDICATIONS: None. COMPLICATIONS: None immediate. PROCEDURE: An ultrasound guided thoracentesis was  thoroughly discussed with the patient and questions answered. The benefits, risks, alternatives and complications were also discussed. The patient understands and wishes to proceed with the procedure. Written consent was obtained. Ultrasound was performed to localize and mark an adequate pocket of fluid in the right posterior chest. The area was then prepped and draped in the normal sterile fashion. 1% Lidocaine was used for local anesthesia. Under ultrasound guidance a 19 gauge, 10-cm, Yueh catheter was introduced. Thoracentesis was performed. The catheter was removed and a dressing applied. FINDINGS: A total of approximately 100 mL of brown purulent fluid was removed. Samples were sent to the laboratory as requested by the clinical team. Right pleural fluid is severely loculated. IMPRESSION: Successful ultrasound guided right thoracentesis yielding 100 mL of pleural fluid. Right pleural effusion is compatible with an empyema. Electronically Signed   By: Markus Daft M.D.   On: 06/17/2016 16:02    ASSESSMENT / PLAN: Large posterior loculated pleural effusion on the right measures 11 x 6 cm compatible with empyema S/p Right sided thoracentesis on 11/30 P: Supplemental O2 as needed to maintain O2 sats >92% or above Plans for CT biopsy with placement of drainage tube or catheter placement with instillation of thrombolytic agents-12/1 NPO after midnight for CT biopsy Start Penicillin G CXR in am  PT/INR and PTT in am prior to CT biopsy  [name], [place] Pager [phone] (please enter 7 digits) PCCM Consult Pager [phone] (please enter 7 digits)  PCCM MD ATTESTATION:  PT SUMMARY: Treated for PNA a few weeks ago Developed pleural effusion noted on CXR 05/25/16 Saw Dr Ashby Dawes 06/01/16 in consultation Thoracentesis  06/03/16 - 150 cc of fluid described as brown/turbid             Pleural fluid with high WBC, LDH and GS positive for microaerophilic  strep   CXR: Increased size of R pleural effusion which appears loculated  CT chest reviewed  IMPRESSION: Empyema - this is now subacute but the fluid from 2 weeks ago had microaerophilic strep species and was very inflamed (very elevated LDH and WBC). It is striking how good he looks and I am surprised that he does not have fever. Nonetheless, this needs to be more thoroughly drained and he needs to be on antibiotics until this process is fully resolved  PLAN/REC: I have discussed with Dr Anselm Pancoast of IR. We will proceed with CT guided pleural catheter placement for intra-pleural tPA + alpha dornase. It is likely that this will need to be repeated per the usual protocol - q 12 hrs for 3 days. Hopefully we can avoid more aggressive interventions such as VATS   Merton Border, MD PCCM service Mobile [phone] Pager [phone] 06/17/2016

## 2016-06-17 NOTE — Progress Notes (Signed)
Patient arrived to 115. No complaints of pain. NT at bedside retrieving vital signs. Madlyn Frankel, RN

## 2016-06-17 NOTE — H&P (Signed)
Name: Jeffery [name] MRN: [id number] DOB: 01/09/1931    ADMISSION DATE:  (Not on file) CONSULTATION DATE:  06/17/2016  REFERRING MD :  Dr. Anselm Pancoast  CHIEF COMPLAINT:  Right sided empyema  BRIEF PATIENT DESCRIPTION: 82 yo male presented to [facility] on 11/30 for a right sided thoracentesis due to a large posterior loculated pleural effusion with the removal of 100 ml brown purulent fluid. However, due to the inability to drain the right sided pleural effusion thoroughly he will need CT biopsy with placement of drainage tube or catheter placement with instillation of thrombolytic agents.   SIGNIFICANT EVENTS  11/30-US Guided Thoracentesis for right sided empyema with removal of 100 ml brown purulent fluid 11/30-Pt admitted to [facility] due to need of CT biopsy for right sided chest tube placement with instillation of thrombolytic agents   STUDIES:  CT Chest 11/30>>Large posterior loculated pleural effusion on the right measures 11 x 6 cm compatible with empyema. Small gas bubble likely due to recent thoracentesis. Compressive atelectasis/ infiltrate in the right lower lobe with a small amount of infiltrate in the right upper lobe. 1 cm patchy density left lower lobe posteriorly may represent scar or infiltrate. Mild splenomegaly. Coronary artery calcification. No definite underlying mass lesion identified.  HISTORY OF PRESENT ILLNESS:   This is an 82 yo male with a PMH of Hyperlipidemia and Pneumonia.  He was referred by Dr. Hall Busing to Pulmonologist Dr. Ashby Dawes for a consultation on 11/14 due to Pneumonia diagnosis 8 weeks prior to outpatient pulmonology consultation .  The pt had a total of three rounds of abx and 1 round of prednisone total due to c/o persistent cough and dyspnea, however he still c/o shortness of breath and voice hoarseness despite treatments prompting pulmonology consultation.  Chest xray on 11/6 revealed small to moderate right pleural effusion with atelectasis that was not seen on  previous xrays.  There was suspicion of a right sided empyema, therefore Dr. Ashby Dawes recommended a right sided thoracentesis.  The thoracentesis was performed on 11/16 with the removal of 150 ml of dark thick fluid, and following procedure an outpatient follow-up appointment was scheduled with pulmonology on 11/30. During follow-up appointment with Pulmonologist Dr. Alva Garnet on 11/30 a CT Chest was ordered that revealed a large posterior loculated pleural effusion, therefore a repeat right sided thoracentesis was performed by IR on 11/30 with drainage of 100 ml brown purulent fluid.  However, due to the inability to drain the right sided pleural effusion thoroughly the pt subsequently required admission to [facility] for a CT biopsy with placement of a right sided drainage tube or catheter placement with plans for instillation of thrombolytic agents (procedure planned for 12/1).    PAST MEDICAL HISTORY :   has a past medical history of Pneumonia.  has a past surgical history that includes Appendectomy and Tonsillectomy. Prior to Admission medications   Medication Sig Start Date End Date Taking? Authorizing Provider  amoxicillin-clavulanate (AUGMENTIN) 875-125 MG tablet Take 1 tablet by mouth 2 (two) times daily. 06/17/16 07/01/16  Wilhelmina Mcardle, MD  guaiFENesin (MUCINEX) 600 MG 12 hr tablet Take by mouth 2 (two) times daily.    Historical Provider, MD  Selena Lesser A/C 100-10 MG/5ML syrup  05/18/16   Historical Provider, MD   No Known Allergies  FAMILY HISTORY:  family history includes ALS in his sister and sister; Cancer in his sister; Heart attack in his father. SOCIAL HISTORY:  reports that he has quit smoking. His smoking use included Cigarettes. He  has a 10.00 pack-year smoking history. He has never used smokeless tobacco. He reports that he drinks alcohol. He reports that he does not use drugs.  REVIEW OF SYSTEMS:  Positives in BOLD Constitutional: Negative for fever, chills, weight loss,  malaise/fatigue and diaphoresis.  HENT: Negative for hearing loss, ear pain, nosebleeds, congestion, sore throat, neck pain, tinnitus and ear discharge.   Eyes: Negative for blurred vision, double vision, photophobia, pain, discharge and redness.  Respiratory: Negative for cough, hemoptysis, sputum production, shortness of breath, wheezing and stridor.   Cardiovascular: Negative for chest pain, palpitations, orthopnea, claudication, leg swelling and PND.  Gastrointestinal: Negative for heartburn, nausea, vomiting, abdominal pain, diarrhea, constipation, blood in stool and melena.  Genitourinary: Negative for dysuria, urgency, frequency, hematuria and flank pain.  Musculoskeletal: Negative for myalgias, back pain, joint pain and falls.  Skin: Negative for itching and rash.  Neurological: Negative for dizziness, tingling, tremors, sensory change, speech change, focal weakness, seizures, loss of consciousness, weakness and headaches.  Endo/Heme/Allergies: Negative for environmental allergies and polydipsia. Does not bruise/bleed easily.  SUBJECTIVE:  Pt currently states he feels great does not have any complaints at this time.  VITAL SIGNS: Pulse Rate:  [106-114] 114 (11/30 1527) Resp:  [18-20] 20 (11/30 1527) BP: (109-128)/(69-90) 123/71 (11/30 1527) SpO2:  [95 %-97 %] 97 % (11/30 1527) Weight:  [173 lb 9.6 oz (78.7 kg)] 173 lb 9.6 oz (78.7 kg) (11/30 1037)  PHYSICAL EXAMINATION: General:  Well developed, well nourished Caucasian male  Neuro:  Alert and oriented, follows commands, PERRLA HEENT:  Supple, no JVD Cardiovascular: Sinus tach, s1s2, no M/R/G Lungs:  Diminished right side, clear on left side, even, non labored Abdomen: +BS x4, soft, non tender, non distended Musculoskeletal:  Normal bulk and tone  Skin:  No rashes or lesions  No results for input(s): NA, K, CL, CO2, BUN, CREATININE, GLUCOSE in the last 168 hours. No results for input(s): HGB, HCT, WBC, PLT in the last 168  hours. Dg Chest 1 View  Result Date: 06/17/2016 CLINICAL DATA:  Followup right thoracentesis. EXAM: CHEST 1 VIEW COMPARISON:  Earlier same day FINDINGS: Heart size is normal. Left chest remains clear. Similar radiographic appearance of right lower chest density. No pneumothorax. IMPRESSION: Persistent right chest density on this one view, little changed. No pneumothorax. Electronically Signed   By: Nelson Chimes M.D.   On: 06/17/2016 16:41   Dg Chest 2 View  Result Date: 06/17/2016 CLINICAL DATA:  Status post thoracentesis 2 weeks ago. Patient has diagnosis of pneumonia. Patient reports fairly stable shortness of breath and other symptoms. Remote history of smoking. EXAM: CHEST  2 VIEW COMPARISON:  Post thoracentesis radiograph of June 03, 2016 and pre thoracentesis PA and lateral chest x-ray of May 25, 2016 FINDINGS: The right lower lobe is completely opacified. The right middle and upper lobes are grossly clear. The left lung is clear. There is no left-sided pleural effusion. The heart and pulmonary vascularity are normal. The mediastinum is normal in width. The bony thorax is unremarkable. IMPRESSION: Probable interval reaccumulation of large right pleural effusion. This could be verified with directed ultrasound. Density throughout the right lower lobe is consistent with atelectasis or pneumonia. These results will be called to the ordering clinician or representative by the Radiologist Assistant, and communication documented in the PACS or zVision Dashboard. Electronically Signed   By: David  Martinique M.D.   On: 06/17/2016 11:20   Ct Chest W Contrast  Result Date: 06/17/2016 CLINICAL DATA:  Pneumonia, empyema  EXAM: CT CHEST WITH CONTRAST TECHNIQUE: Multidetector CT imaging of the chest was performed during intravenous contrast administration. CONTRAST:  60mL ISOVUE-300 IOPAMIDOL (ISOVUE-300) INJECTION 61% COMPARISON:  Chest two-view 06/17/2016 FINDINGS: Cardiovascular: Thoracic aorta within  normal limits. Pulmonary arteries normal. Coronary artery calcification. Aortic valve calcification. Negative for pericardial effusion. Mediastinum/Nodes: No pathologic adenopathy. Fluid in the superior pericardial recess noted. Lungs/Pleura: Large posterior loculated pleural effusion on the right measuring 11 x 6 cm. Small gas bubble within the fluid may be related to thoracentesis. There is compressive atelectasis/ infiltrate in the right lower lobe. Small amount of infiltrate is present in the right upper lobe posteriorly and laterally. Subpleural patchy density left lower lobe measuring under 1 cm, possibly an area of scar or infiltrate. No pleural effusion on the left Upper Abdomen: Small low-density lesions in the left lobe liver possibly cysts. Low-density in the lateral left liver adjacent the falciform ligament likely due to fatty liver. Mild splenomegaly. 1 cm right renal stone. Musculoskeletal: Moderately severe compression fracture L1 appears chronic. No acute skeletal abnormality. IMPRESSION: Large posterior loculated pleural effusion on the right measures 11 x 6 cm compatible with empyema. Small gas bubble likely due to recent thoracentesis. Compressive atelectasis/ infiltrate in the right lower lobe with a small amount of infiltrate in the right upper lobe. 1 cm patchy density left lower lobe posteriorly may represent scar or infiltrate. Mild splenomegaly Coronary artery calcification No definite underlying mass lesion identified. Electronically Signed   By: Franchot Gallo M.D.   On: 06/17/2016 12:09   US Thoracentesis Asp Pleural Space W/img Guide  Result Date: 06/17/2016 INDICATION: 82 year old with right pleural effusion, bacterial. EXAM: ULTRASOUND GUIDED RIGHT THORACENTESIS MEDICATIONS: None. COMPLICATIONS: None immediate. PROCEDURE: An ultrasound guided thoracentesis was thoroughly discussed with the patient and questions answered. The benefits, risks, alternatives and complications were  also discussed. The patient understands and wishes to proceed with the procedure. Written consent was obtained. Ultrasound was performed to localize and mark an adequate pocket of fluid in the right posterior chest. The area was then prepped and draped in the normal sterile fashion. 1% Lidocaine was used for local anesthesia. Under ultrasound guidance a 19 gauge, 10-cm, Yueh catheter was introduced. Thoracentesis was performed. The catheter was removed and a dressing applied. FINDINGS: A total of approximately 100 mL of brown purulent fluid was removed. Samples were sent to the laboratory as requested by the clinical team. Right pleural fluid is severely loculated. IMPRESSION: Successful ultrasound guided right thoracentesis yielding 100 mL of pleural fluid. Right pleural effusion is compatible with an empyema. Electronically Signed   By: Markus Daft M.D.   On: 06/17/2016 16:02    ASSESSMENT / PLAN: Large posterior loculated pleural effusion on the right measures 11 x 6 cm compatible with empyema S/p Right sided thoracentesis on 11/30 P: Supplemental O2 as needed to maintain O2 sats >92% or above Plans for CT biopsy with placement of drainage tube or catheter placement with instillation of thrombolytic agents-12/1 NPO after midnight for CT biopsy Start Penicillin G CXR in am  PT/INR and PTT in am prior to CT biopsy  [name], [place] Pager [phone] (please enter 7 digits) PCCM Consult Pager [phone] (please enter 7 digits)  PCCM MD ATTESTATION:  PT SUMMARY: Treated for PNA a few weeks ago Developed pleural effusion noted on CXR 05/25/16 Saw Dr Ashby Dawes 06/01/16 in consultation Thoracentesis 06/03/16 - 150 cc of fluid described as brown/turbid  Pleural fluid with high WBC, LDH and GS positive for microaerophilic strep   CXR: Increased size of R pleural effusion which appears loculated  CT chest reviewed  IMPRESSION: Empyema - this is  now subacute but the fluid from 2 weeks ago had microaerophilic strep species and was very inflamed (very elevated LDH and WBC). It is striking how good he looks and I am surprised that he does not have fever. Nonetheless, this needs to be more thoroughly drained and he needs to be on antibiotics until this process is fully resolved  PLAN/REC: I have discussed with Dr Anselm Pancoast of IR. We will proceed with CT guided pleural catheter placement for intra-pleural tPA + alpha dornase. It is likely that this will need to be repeated per the usual protocol - q 12 hrs for 3 days. Hopefully we can avoid more aggressive interventions such as VATS   Merton Border, MD PCCM service Mobile [phone] Pager [phone] 06/17/2016

## 2016-06-17 NOTE — Progress Notes (Signed)
Pharmacy Antibiotic Note  Jeffery [name] is a 82 y.o. male admitted on (Not on file) with empyema.  Pharmacy has been consulted for pen G dosing.  Plan: Pen G 5 million units IV q6h     No data recorded.  No results for input(s): WBC, CREATININE, LATICACIDVEN, VANCOTROUGH, VANCOPEAK, VANCORANDOM, GENTTROUGH, GENTPEAK, GENTRANDOM, TOBRATROUGH, TOBRAPEAK, TOBRARND, AMIKACINPEAK, AMIKACINTROU, AMIKACIN in the last 168 hours.  Estimated Creatinine Clearance: 72.2 mL/min (by C-G formula based on SCr of 0.72 mg/dL).    No Known Allergies  Thank you for allowing pharmacy to be a part of this patient's care.  Darylene Price [facility] 06/17/2016 6:13 PM

## 2016-06-17 NOTE — Patient Instructions (Signed)
CT chest now. Then we will decide on the best way to proceed. Possibilities include a repeat thoracentesis if we can get it thoroughly drained with that procedure but we might have to admit you to the hospital to place a drainage tube or catheter  I have sent order for antibiotic, Augmentin to be taken if you don't get admitted to the hopsital

## 2016-06-17 NOTE — Progress Notes (Signed)
PULMONARY OFFICE FOLLOW UP  PT SUMMARY: Treated for PNA a few weeks ago Developed pleural effusion noted on CXR 05/25/16 Saw Dr Ashby Dawes 06/01/16 in consultation Thoracentesis 06/03/16 - 150 cc of fluid described as brown/turbid  Pleural fluid with very high WBC, LDH and GS positive for microaerophilic strep  SUBJ: Here for follow up after thoracentesis. He continues to have nagging cough and vague R sided thoracic discomfort. Also notes mild DOE which is not normal for him. Denies fever, chills, sweats, hemoptysis, N/V/D, dysuria, LE edema and calf tenderness  OBJ: Vitals:   06/17/16 1037  BP: 126/74  Pulse: (!) 107  SpO2: 96%  Weight: 173 lb 9.6 oz (78.7 kg)  Height: 5\' 10"  (1.778 m)   No distress HEENT WNL No JVD noted, no LAN Dull to percussion with absent BS approx 1/2 up on R. L side clear Reg, no M NABS No LE edema  BMP Latest Ref Rng & Units 06/03/2016  Glucose 65 - 99 mg/dL 104(H)  BUN 6 - 20 mg/dL 8  Creatinine 0.61 - 1.24 mg/dL 0.72  Sodium 135 - 145 mmol/L 133(L)  Potassium 3.5 - 5.1 mmol/L 4.3  Chloride 101 - 111 mmol/L 98(L)  CO2 22 - 32 mmol/L 28  Calcium 8.9 - 10.3 mg/dL 9.2   CBC Latest Ref Rng & Units 06/03/2016  WBC 3.8 - 10.6 K/uL 13.1(H)  Hemoglobin 13.0 - 18.0 g/dL 12.7(L)  Hematocrit 40.0 - 52.0 % 38.2(L)  Platelets 150 - 440 K/uL 430   CXR: Increased size of R pleural effusion which appears loculated  IMPRESSION: Empyema - this is now subacute but the fluid from 2 weeks ago had microaerophilic strep species and was very inflamed (very elevated LDH and WBC). It is striking how good he looks and I am surprised that he does not have fever. Nonetheless, this needs to be more thoroughly drained and he needs to be on antibiotics until this process is fully resolved  PLAN/REC: I have discussed with Dr Anselm Pancoast of IR. We will obtain CT chest for more complete imaging of the pleural space, then, if it looks feasible, US guided thoracentesis. If it can  be completely or nearly completely drained by thoracentesis, he will go home with Augmentin X 2 weeks and follow up with CXR @ that time.   If it cannot be adequately drained by thora, we will have to admit him for either chest tube or pleural catheter palcement  Merton Border, MD PCCM service Mobile [phone] Pager [phone] 06/17/2016  ADD: CT chest reviewed and there appeared to be a predominant loculation with extensive pleural thickening. Dr Jeronimo Norma performed thoracentesis which yielded only 100 cc of pus. There were extensive septations and significant fluid remained. We admitted pt with plan for pigtail catheter placement followed by tPA/alpha dornase protocol.   Merton Border, MD PCCM service Mobile [phone] Pager [phone] 06/17/2016

## 2016-06-17 NOTE — Procedures (Signed)
US guided right thoracentesis.  Removed 100 ml of brown purulent fluid.  No immediate complication.  CXR pending.

## 2016-06-18 ENCOUNTER — Inpatient Hospital Stay: Payer: Medicare Other

## 2016-06-18 DIAGNOSIS — J90 Pleural effusion, not elsewhere classified: Secondary | ICD-10-CM

## 2016-06-18 DIAGNOSIS — J869 Pyothorax without fistula: Principal | ICD-10-CM

## 2016-06-18 LAB — COMPREHENSIVE METABOLIC PANEL
ALBUMIN: 2.4 g/dL — AB (ref 3.5–5.0)
ALK PHOS: 66 U/L (ref 38–126)
ALT: 9 U/L — AB (ref 17–63)
AST: 10 U/L — AB (ref 15–41)
Anion gap: 7 (ref 5–15)
BUN: 8 mg/dL (ref 6–20)
CALCIUM: 8.8 mg/dL — AB (ref 8.9–10.3)
CHLORIDE: 105 mmol/L (ref 101–111)
CO2: 26 mmol/L (ref 22–32)
CREATININE: 0.55 mg/dL — AB (ref 0.61–1.24)
GFR calc non Af Amer: >60 mL/min (ref >60)
GLUCOSE: 111 mg/dL — AB (ref 65–99)
Potassium: 3.7 mmol/L (ref 3.5–5.1)
SODIUM: 138 mmol/L (ref 135–145)
Total Bilirubin: 0.1 mg/dL — ABNORMAL LOW (ref 0.3–1.2)
Total Protein: 6.5 g/dL (ref 6.5–8.1)

## 2016-06-18 LAB — PROTIME-INR
INR: 1.27
Prothrombin Time: 16 seconds — ABNORMAL HIGH (ref 11.4–15.2)

## 2016-06-18 LAB — CBC
HCT: 32.2 % — ABNORMAL LOW (ref 40.0–52.0)
HEMOGLOBIN: 10.8 g/dL — AB (ref 13.0–18.0)
MCH: 27.3 pg (ref 26.0–34.0)
MCHC: 33.6 g/dL (ref 32.0–36.0)
MCV: 81.2 fL (ref 80.0–100.0)
PLATELETS: 398 10*3/uL (ref 150–440)
RBC: 3.97 MIL/uL — AB (ref 4.40–5.90)
RDW: 14.7 % — ABNORMAL HIGH (ref 11.5–14.5)
WBC: 10.4 10*3/uL (ref 3.8–10.6)

## 2016-06-18 LAB — APTT: APTT: 41 s — AB (ref 24–36)

## 2016-06-18 LAB — TRIGLYCERIDES, BODY FLUIDS: Triglycerides, Fluid: 256 mg/dL

## 2016-06-18 MED ORDER — SODIUM CHLORIDE 0.9 % IV SOLN
INTRAVENOUS | Status: DC
  Administered 2016-06-18: 08:00:00 via INTRAVENOUS

## 2016-06-18 MED ORDER — STERILE WATER FOR INJECTION IJ SOLN
5.0000 mg | Freq: Once | RESPIRATORY_TRACT | Status: DC
  Administered 2016-06-18: 15:00:00 5 mg via INTRAPLEURAL
  Filled 2016-06-18: qty 5

## 2016-06-18 MED ORDER — MIDAZOLAM HCL 2 MG/2ML IJ SOLN
INTRAMUSCULAR | Status: AC | PRN
  Administered 2016-06-18 (×3): 1 mg via INTRAVENOUS

## 2016-06-18 MED ORDER — PREMIER PROTEIN SHAKE
11.0000 [oz_av] | ORAL | Status: DC
  Administered 2016-06-18 – 2016-06-22 (×4): 11 [oz_av] via ORAL

## 2016-06-18 MED ORDER — STERILE WATER FOR INJECTION IJ SOLN
5.0000 mg | Freq: Every day | INTRAMUSCULAR | Status: AC
  Administered 2016-06-19 – 2016-06-20 (×2): 5 mg via INTRAPLEURAL
  Filled 2016-06-18 (×2): qty 5

## 2016-06-18 MED ORDER — MIDAZOLAM HCL 2 MG/2ML IJ SOLN
INTRAMUSCULAR | Status: AC
Start: 2016-06-18 — End: 2016-06-18
  Filled 2016-06-18: qty 6

## 2016-06-18 MED ORDER — FENTANYL CITRATE (PF) 100 MCG/2ML IJ SOLN
INTRAMUSCULAR | Status: AC
  Filled 2016-06-18: qty 4

## 2016-06-18 MED ORDER — ACETAMINOPHEN 325 MG PO TABS
650.0000 mg | ORAL_TABLET | ORAL | Status: DC | PRN
  Administered 2016-06-18 – 2016-06-19 (×2): 650 mg via ORAL
  Filled 2016-06-18 (×3): qty 2

## 2016-06-18 MED ORDER — SODIUM CHLORIDE 0.9 % IJ SOLN
10.0000 mg | Freq: Every day | INTRAMUSCULAR | Status: AC
  Administered 2016-06-19 – 2016-06-20 (×2): 10 mg via INTRAPLEURAL
  Filled 2016-06-18 (×2): qty 10

## 2016-06-18 MED ORDER — FENTANYL CITRATE (PF) 100 MCG/2ML IJ SOLN
INTRAMUSCULAR | Status: AC | PRN
  Administered 2016-06-18 (×2): 50 ug via INTRAVENOUS

## 2016-06-18 MED ORDER — ALTEPLASE 2 MG IJ SOLR
Freq: Once | INTRAMUSCULAR | Status: DC
  Administered 2016-06-18: 15:00:00 via INTRAPLEURAL
  Filled 2016-06-18: qty 10

## 2016-06-18 MED ORDER — MORPHINE SULFATE (PF) 4 MG/ML IV SOLN
2.0000 mg | INTRAVENOUS | Status: DC | PRN
  Administered 2016-06-18 – 2016-06-19 (×4): 2 mg via INTRAVENOUS
  Filled 2016-06-18 (×4): qty 1

## 2016-06-18 NOTE — Progress Notes (Signed)
Initial Nutrition Assessment  DOCUMENTATION CODES:   Severe malnutrition in context of chronic illness  INTERVENTION:  Provide Premier Protein po once daily, each supplement provides 160 kcal and 30 grams protein.  Encouraged adequate intake of calories and protein through meals and beverages.  NUTRITION DIAGNOSIS:   Malnutrition (Severe) related to chronic illness, social / environmental circumstances as evidenced by severe depletion of body fat, moderate depletions of muscle mass, severe depletion of muscle mass.  GOAL:   Patient will meet greater than or equal to 90% of their needs  MONITOR:   PO intake, Supplement acceptance, Labs, Weight trends, I & O's  REASON FOR ASSESSMENT:   Malnutrition Screening Tool    ASSESSMENT:   82 yo male presented to [facility] on 11/30 for a right sided thoracentesis due to a large posterior loculated pleural effusion with the removal of 100 ml brown purulent fluid.   -S/P CT guided drainage by percutaneous catheter.  -Patient has advanced to Regular diet since assessment this morning.  Spoke with patient at bedside. He reports his appetite has been poor for 5-6 weeks. He reports he has still been eating his typical intake. He eats 100% of 3 meals daily. Denies N/V, constipation/diarrhea, or abdominal pain. When discussing importance of adequate calories and protein, patient is very concerned that he will gain weight and "get fat." Assured patient that it is important to stay well-nourished so he can stay strong. He wanted a supplement with lower calories, so will provide Premier Protein.   Patient reports UBW of 180 lbs and that he has lost 15 lbs over 9 weeks. Unsure of the accuracy of weight in chart. Per patient's reported UBW, he has lost 11 lbs (6% body weight) over 2 months, which is not significant for time frame.  Typical Intake: Breakfast: fruit and yogurt Lunch: chicken or pork with vegetables Dinner: chicken or pork with  vegetables  Medications reviewed and include: penicillin.  Labs reviewed: Creatinine 0.55, Glucose 111, Albumin 2.4, AST 10, ALT 9, T Bili 0.1.   Nutrition-Focused physical exam completed. Findings are severe fat depletion, moderate to severe muscle depletion, and no edema.   Patient does meet criteria for severe chronic malnutrition in setting of severe fat depletion, moderate to severe muscle depletion. There may be a social/environmental component as patient is very concerned about not gaining weight and eating healthy foods.  Diet Order:  Diet regular Room service appropriate? Yes; Fluid consistency: Thin  Skin:  Reviewed, no issues  Last BM:  06/17/2016  Height:   Ht Readings from Last 1 Encounters:  06/17/16 5\' 10"  (1.778 m)    Weight:   Wt Readings from Last 1 Encounters:  06/17/16 169 lb 8 oz (76.9 kg)    Ideal Body Weight:  75.45 kg  BMI:  Body mass index is 24.32 kg/m.  Estimated Nutritional Needs:   Kcal:  1770-2070 (MSJ x 1.2-1.4)  Protein:  100-115 grams (1.3-1.5 grams/kg)  Fluid:  >/= 1.9 L/day (25 ml/kg)  EDUCATION NEEDS:   Education needs addressed  Willey Blade, MS, RD, LDN Pager: [phone] After Hours Pager: [phone]

## 2016-06-18 NOTE — Care Management (Signed)
Admitted to [facility] with the diagnosis of empyema. Lives with wife, Katharine Look, [phone]). Last seen Dr, Hall Busing a week ago. No home health. No skilled facility. No home oxygen. Takes care of all basic activities of daily living himself, drives. No falls. Good appetite. Prescriptions are filled at [facility] in [place]. Wife will transport. Shelbie Ammons RN MSN CCM Care Management

## 2016-06-18 NOTE — H&P (Signed)
Name: Jeffery [name] MRN: [id number] DOB: 1930/08/05    ADMISSION DATE:  06/17/2016 CONSULTATION DATE:  06/17/2016  PT SUMMARY: Treated for PNA a few weeks ago Developed pleural effusion noted on CXR 05/25/16 Saw Dr Ashby Dawes 06/01/16 in consultation Thoracentesis 06/03/16 - 150 cc of fluid described as brown/turbid             Pleural fluid with high WBC, LDH and GS positive for microaerophilic strep   CXR: Increased size of R pleural effusion which appears loculated    REFERRING MD :  Dr. Anselm Pancoast  CHIEF COMPLAINT:  Right sided empyema  BRIEF PATIENT DESCRIPTION: 82 yo male presented to [facility] on 11/30 for a right sided thoracentesis due to a large posterior loculated pleural effusion with the removal of 100 ml brown purulent fluid. However, due to the inability to drain the right sided pleural effusion thoroughly he will need CT biopsy with placement of drainage tube or catheter placement with instillation of thrombolytic agents.   SIGNIFICANT EVENTS  11/30-US Guided Thoracentesis for right sided empyema with removal of 100 ml brown purulent fluid 11/30-Pt admitted to [facility] due to need of CT biopsy for right sided chest tube placement with instillation of thrombolytic agents  12/1 CT guided pigtain RT lung pending STUDIES:  CT Chest 11/30>>Large posterior loculated pleural effusion on the right measures 11 x 6 cm compatible with empyema. Small gas bubble likely due to recent thoracentesis. Compressive atelectasis/ infiltrate in the right lower lobe with a small amount of infiltrate in the right upper lobe. 1 cm patchy density left lower lobe posteriorly may represent scar or infiltrate. Mild splenomegaly. Coronary artery calcification. No definite underlying mass lesion identified.  HISTORY OF PRESENT ILLNESS:   This is an 82 yo male with a PMH of Hyperlipidemia and Pneumonia.  He was referred by Dr. Hall Busing to Pulmonologist Dr. Ashby Dawes for a consultation on 11/14 due to  Pneumonia diagnosis 8 weeks prior to outpatient pulmonology consultation .  The pt had a total of three rounds of abx and 1 round of prednisone total due to c/o persistent cough and dyspnea, however he still c/o shortness of breath and voice hoarseness despite treatments prompting pulmonology consultation.  Chest xray on 11/6 revealed small to moderate right pleural effusion with atelectasis that was not seen on previous xrays.  There was suspicion of a right sided empyema, therefore Dr. Ashby Dawes recommended a right sided thoracentesis.  The thoracentesis was performed on 11/16 with the removal of 150 ml of dark thick fluid, and following procedure an outpatient follow-up appointment was scheduled with pulmonology on 11/30. During follow-up appointment with Pulmonologist Dr. Alva Garnet on 11/30 a CT Chest was ordered that revealed a large posterior loculated pleural effusion, therefore a repeat right sided thoracentesis was performed by IR on 11/30 with drainage of 100 ml brown purulent fluid.  However, due to the inability to drain the right sided pleural effusion thoroughly the pt subsequently required admission to [facility] for a CT biopsy with placement of a right sided drainage tube or catheter placement with plans for instillation of thrombolytic agents (procedure planned for 12/1).    SUBJECTIVE:   Has chest wall pain Patient states that he doesn't feel good.    REVIEW OF SYSTEMS:  Positives in BOLD Constitutional: Negative for fever, chills, weight loss, malaise/fatigue and diaphoresis.  HENT: Negative for hearing loss, ear pain, nosebleeds, congestion, sore throat, neck pain, tinnitus and ear discharge.   Eyes: Negative for blurred vision, double vision, photophobia,  pain, discharge and redness.  Respiratory: Negative for cough, hemoptysis, sputum production, shortness of breath, wheezing and stridor.   Cardiovascular: Negative for chest pain, palpitations, orthopnea, claudication, leg swelling and  PND.  Gastrointestinal: Negative for heartburn, nausea, vomiting, abdominal pain, diarrhea, constipation, blood in stool and melena.  Genitourinary: Negative for dysuria, urgency, frequency, hematuria and flank pain.  Musculoskeletal: Negative for myalgias, back pain, joint pain and falls.  Skin: Negative for itching and rash.  Neurological: Negative for dizziness, tingling, tremors, sensory change, speech change, focal weakness, seizures, loss of consciousness, weakness and headaches.  Endo/Heme/Allergies: Negative for environmental allergies and polydipsia. Does not bruise/bleed easily.    VITAL SIGNS: Temp:  [97.7 F (36.5 C)-99.4 F (37.4 C)] 97.7 F (36.5 C) (12/01 0759) Pulse Rate:  [92-121] 93 (12/01 0759) Resp:  [18-20] 18 (12/01 0453) BP: (109-135)/(64-90) 120/72 (12/01 0759) SpO2:  [95 %-97 %] 96 % (12/01 0759) Weight:  [169 lb 8 oz (76.9 kg)] 169 lb 8 oz (76.9 kg) (11/30 1958)  PHYSICAL EXAMINATION: General:  Well developed, well nourished Caucasian male  Neuro:  Alert and oriented, follows commands, PERRLA HEENT:  Supple, no JVD Cardiovascular: Sinus tach, s1s2, no M/R/G Lungs:  Diminished right side, clear on left side, even, non labored Abdomen: +BS x4, soft, non tender, non distended Musculoskeletal:  Normal bulk and tone  Skin:  No rashes or lesions   Recent Labs Lab 06/18/16 0441  NA 138  K 3.7  CL 105  CO2 26  BUN 8  CREATININE 0.55*  GLUCOSE 111*    Recent Labs Lab 06/18/16 0441  HGB 10.8*  HCT 32.2*  WBC 10.4  PLT 398   Dg Chest 1 View  Result Date: 06/17/2016 CLINICAL DATA:  Followup right thoracentesis. EXAM: CHEST 1 VIEW COMPARISON:  Earlier same day FINDINGS: Heart size is normal. Left chest remains clear. Similar radiographic appearance of right lower chest density. No pneumothorax. IMPRESSION: Persistent right chest density on this one view, little changed. No pneumothorax. Electronically Signed   By: Nelson Chimes M.D.   On: 06/17/2016  16:41   Dg Chest 2 View  Result Date: 06/17/2016 CLINICAL DATA:  Status post thoracentesis 2 weeks ago. Patient has diagnosis of pneumonia. Patient reports fairly stable shortness of breath and other symptoms. Remote history of smoking. EXAM: CHEST  2 VIEW COMPARISON:  Post thoracentesis radiograph of June 03, 2016 and pre thoracentesis PA and lateral chest x-ray of May 25, 2016 FINDINGS: The right lower lobe is completely opacified. The right middle and upper lobes are grossly clear. The left lung is clear. There is no left-sided pleural effusion. The heart and pulmonary vascularity are normal. The mediastinum is normal in width. The bony thorax is unremarkable. IMPRESSION: Probable interval reaccumulation of large right pleural effusion. This could be verified with directed ultrasound. Density throughout the right lower lobe is consistent with atelectasis or pneumonia. These results will be called to the ordering clinician or representative by the Radiologist Assistant, and communication documented in the PACS or zVision Dashboard. Electronically Signed   By: David  Martinique M.D.   On: 06/17/2016 11:20   Ct Chest W Contrast  Result Date: 06/17/2016 CLINICAL DATA:  Pneumonia, empyema EXAM: CT CHEST WITH CONTRAST TECHNIQUE: Multidetector CT imaging of the chest was performed during intravenous contrast administration. CONTRAST:  28mL ISOVUE-300 IOPAMIDOL (ISOVUE-300) INJECTION 61% COMPARISON:  Chest two-view 06/17/2016 FINDINGS: Cardiovascular: Thoracic aorta within normal limits. Pulmonary arteries normal. Coronary artery calcification. Aortic valve calcification. Negative for pericardial effusion. Mediastinum/Nodes:  No pathologic adenopathy. Fluid in the superior pericardial recess noted. Lungs/Pleura: Large posterior loculated pleural effusion on the right measuring 11 x 6 cm. Small gas bubble within the fluid may be related to thoracentesis. There is compressive atelectasis/ infiltrate in the  right lower lobe. Small amount of infiltrate is present in the right upper lobe posteriorly and laterally. Subpleural patchy density left lower lobe measuring under 1 cm, possibly an area of scar or infiltrate. No pleural effusion on the left Upper Abdomen: Small low-density lesions in the left lobe liver possibly cysts. Low-density in the lateral left liver adjacent the falciform ligament likely due to fatty liver. Mild splenomegaly. 1 cm right renal stone. Musculoskeletal: Moderately severe compression fracture L1 appears chronic. No acute skeletal abnormality. IMPRESSION: Large posterior loculated pleural effusion on the right measures 11 x 6 cm compatible with empyema. Small gas bubble likely due to recent thoracentesis. Compressive atelectasis/ infiltrate in the right lower lobe with a small amount of infiltrate in the right upper lobe. 1 cm patchy density left lower lobe posteriorly may represent scar or infiltrate. Mild splenomegaly Coronary artery calcification No definite underlying mass lesion identified. Electronically Signed   By: Franchot Gallo M.D.   On: 06/17/2016 12:09   Dg Chest Port 1 View  Result Date: 06/18/2016 CLINICAL DATA:  Empyema EXAM: PORTABLE CHEST 1 VIEW COMPARISON:  Yesterday FINDINGS: Right basilar opacity correlating with prior chest CT and history of empyema. Density is increased in diaphragm is less well-defined. The left lung is clear. Normal heart size and mediastinal contours. No pneumothorax. IMPRESSION: Empyema at the right base with mildly increased increased density from yesterday. Electronically Signed   By: Monte Fantasia M.D.   On: 06/18/2016 08:03   US Thoracentesis Asp Pleural Space W/img Guide  Result Date: 06/17/2016 INDICATION: 82 year old with right pleural effusion, bacterial. EXAM: ULTRASOUND GUIDED RIGHT THORACENTESIS MEDICATIONS: None. COMPLICATIONS: None immediate. PROCEDURE: An ultrasound guided thoracentesis was thoroughly discussed with the patient  and questions answered. The benefits, risks, alternatives and complications were also discussed. The patient understands and wishes to proceed with the procedure. Written consent was obtained. Ultrasound was performed to localize and mark an adequate pocket of fluid in the right posterior chest. The area was then prepped and draped in the normal sterile fashion. 1% Lidocaine was used for local anesthesia. Under ultrasound guidance a 19 gauge, 10-cm, Yueh catheter was introduced. Thoracentesis was performed. The catheter was removed and a dressing applied. FINDINGS: A total of approximately 100 mL of brown purulent fluid was removed. Samples were sent to the laboratory as requested by the clinical team. Right pleural fluid is severely loculated. IMPRESSION: Successful ultrasound guided right thoracentesis yielding 100 mL of pleural fluid. Right pleural effusion is compatible with an empyema. Electronically Signed   By: Markus Daft M.D.   On: 06/17/2016 16:02    ASSESSMENT / PLAN: Large posterior loculated pleural effusion on the right measures 11 x 6 cm compatible with empyema S/p Right sided thoracentesis on 11/30 P: Supplemental O2 as needed to maintain O2 sats >92% or above Plans for CT biopsy with placement of drainage tube or catheter placement with instillation of thrombolytic agents-12/1 today -Penicillin G -Dr Genevive Bi consulted-plan for  Decortication/VATS after assessment of  TPA/pulmozyme therapy    I have personally obtained a history, examined the patient, evaluated Pertinent laboratory and RadioGraphic/imaging results, and  formulated the assessment and plan   The Patient requires high complexity decision making for assessment and support, frequent evaluation and titration  of therapies.   Patient satisfied with Plan of action and management. All questions answered  Corrin Parker, M.D.  [facility]  Medical Director [place]  Director [facility] Cardio-Pulmonary Department

## 2016-06-18 NOTE — Consult Note (Signed)
Chief Complaint: Patient was seen in consultation today for No chief complaint on file.  at the request of Simonds,David B  Referring Physician(s): Simonds,David B  History of Present Illness: Jeffery [name] is a 82 y.o. male who underwent a right thoracentesis yielding pus consistent with empyema. Chest tube placement is requested with subsequent tPA lysis. He feels well and has no complaints. He denies fever or chills.  Past Medical History:  Diagnosis Date  . Hyperlipidemia   . Pneumonia     Past Surgical History:  Procedure Laterality Date  . APPENDECTOMY    . TONSILLECTOMY      Allergies: Patient has no known allergies.  Medications: Prior to Admission medications   Medication Sig Start Date End Date Taking? Authorizing Provider  VIRTUSSIN A/C 100-10 MG/5ML syrup 5 mLs 3 (three) times daily as needed.  05/18/16  Yes Historical Provider, MD  amoxicillin-clavulanate (AUGMENTIN) 875-125 MG tablet Take 1 tablet by mouth 2 (two) times daily. Patient not taking: Reported on 06/18/2016 06/17/16 07/01/16  Wilhelmina Mcardle, MD     Family History  Problem Relation Age of Onset  . Heart attack Father   . ALS Sister   . Cancer Sister   . ALS Sister     Social History   Social History  . Marital status: Married    Spouse name: N/A  . Number of children: N/A  . Years of education: N/A   Social History Main Topics  . Smoking status: Former Smoker    Packs/day: 1.00    Years: 10.00    Types: Cigarettes  . Smokeless tobacco: Never Used     Comment: quit smoking in 1970  . Alcohol use Yes     Comment: occ  . Drug use: No  . Sexual activity: Not Asked   Other Topics Concern  . None   Social History Narrative  . None      Review of Systems: A 12 point ROS discussed and pertinent positives are indicated in the HPI above.  All other systems are negative.  Review of Systems  Vital Signs: BP 120/72   Pulse 93   Temp 97.7 F (36.5 C) (Oral)   Resp 18    Ht 5\' 10"  (1.778 m)   Wt 169 lb 8 oz (76.9 kg)   SpO2 96%   BMI 24.32 kg/m   Physical Exam  Constitutional: He is oriented to person, place, and time. He appears well-developed and well-nourished.  Eyes: EOM are normal. Pupils are equal, round, and reactive to light.  Cardiovascular: Normal rate and regular rhythm.   Pulmonary/Chest: Effort normal and breath sounds normal.  Neurological: He is alert and oriented to person, place, and time.  Skin: Skin is warm and dry.    Mallampati Score:   1  Imaging: Dg Chest 1 View  Result Date: 06/17/2016 CLINICAL DATA:  Followup right thoracentesis. EXAM: CHEST 1 VIEW COMPARISON:  Earlier same day FINDINGS: Heart size is normal. Left chest remains clear. Similar radiographic appearance of right lower chest density. No pneumothorax. IMPRESSION: Persistent right chest density on this one view, little changed. No pneumothorax. Electronically Signed   By: Nelson Chimes M.D.   On: 06/17/2016 16:41   Dg Chest 2 View  Result Date: 06/17/2016 CLINICAL DATA:  Status post thoracentesis 2 weeks ago. Patient has diagnosis of pneumonia. Patient reports fairly stable shortness of breath and other symptoms. Remote history of smoking. EXAM: CHEST  2 VIEW COMPARISON:  Post thoracentesis radiograph  of June 03, 2016 and pre thoracentesis PA and lateral chest x-ray of May 25, 2016 FINDINGS: The right lower lobe is completely opacified. The right middle and upper lobes are grossly clear. The left lung is clear. There is no left-sided pleural effusion. The heart and pulmonary vascularity are normal. The mediastinum is normal in width. The bony thorax is unremarkable. IMPRESSION: Probable interval reaccumulation of large right pleural effusion. This could be verified with directed ultrasound. Density throughout the right lower lobe is consistent with atelectasis or pneumonia. These results will be called to the ordering clinician or representative by the  Radiologist Assistant, and communication documented in the PACS or zVision Dashboard. Electronically Signed   By: David  Martinique M.D.   On: 06/17/2016 11:20   Dg Chest 2 View  Result Date: 05/25/2016 CLINICAL DATA:  Pneumonia for 7 weeks with shortness of breath EXAM: CHEST  2 VIEW COMPARISON:  05/11/2016 FINDINGS: There is a small right pleural effusion. There is right lower lobe airspace disease. The left lung is clear. There is no left pleural effusion or pneumothorax. The heart and mediastinal contours are unremarkable. The osseous structures are unremarkable. IMPRESSION: Right lower lobe airspace disease with a small right pleural effusion. This may reflect atelectasis versus pneumonia. Electronically Signed   By: Kathreen Devoid   On: 05/25/2016 12:30   Ct Chest W Contrast  Result Date: 06/17/2016 CLINICAL DATA:  Pneumonia, empyema EXAM: CT CHEST WITH CONTRAST TECHNIQUE: Multidetector CT imaging of the chest was performed during intravenous contrast administration. CONTRAST:  57mL ISOVUE-300 IOPAMIDOL (ISOVUE-300) INJECTION 61% COMPARISON:  Chest two-view 06/17/2016 FINDINGS: Cardiovascular: Thoracic aorta within normal limits. Pulmonary arteries normal. Coronary artery calcification. Aortic valve calcification. Negative for pericardial effusion. Mediastinum/Nodes: No pathologic adenopathy. Fluid in the superior pericardial recess noted. Lungs/Pleura: Large posterior loculated pleural effusion on the right measuring 11 x 6 cm. Small gas bubble within the fluid may be related to thoracentesis. There is compressive atelectasis/ infiltrate in the right lower lobe. Small amount of infiltrate is present in the right upper lobe posteriorly and laterally. Subpleural patchy density left lower lobe measuring under 1 cm, possibly an area of scar or infiltrate. No pleural effusion on the left Upper Abdomen: Small low-density lesions in the left lobe liver possibly cysts. Low-density in the lateral left liver  adjacent the falciform ligament likely due to fatty liver. Mild splenomegaly. 1 cm right renal stone. Musculoskeletal: Moderately severe compression fracture L1 appears chronic. No acute skeletal abnormality. IMPRESSION: Large posterior loculated pleural effusion on the right measures 11 x 6 cm compatible with empyema. Small gas bubble likely due to recent thoracentesis. Compressive atelectasis/ infiltrate in the right lower lobe with a small amount of infiltrate in the right upper lobe. 1 cm patchy density left lower lobe posteriorly may represent scar or infiltrate. Mild splenomegaly Coronary artery calcification No definite underlying mass lesion identified. Electronically Signed   By: Franchot Gallo M.D.   On: 06/17/2016 12:09   Dg Chest Port 1 View  Result Date: 06/18/2016 CLINICAL DATA:  Empyema EXAM: PORTABLE CHEST 1 VIEW COMPARISON:  Yesterday FINDINGS: Right basilar opacity correlating with prior chest CT and history of empyema. Density is increased in diaphragm is less well-defined. The left lung is clear. Normal heart size and mediastinal contours. No pneumothorax. IMPRESSION: Empyema at the right base with mildly increased increased density from yesterday. Electronically Signed   By: Monte Fantasia M.D.   On: 06/18/2016 08:03   Dg Chest [facility] 1 View  Result  Date: 06/03/2016 CLINICAL DATA:  Status post thoracentesis. EXAM: PORTABLE CHEST 1 VIEW COMPARISON:  Chest radiographs 05/25/2016 FINDINGS: Decreased size of right pleural effusion. Moderate degree of persistent hazy opacity throughout the mid and lower right hemithorax consistent with residual pleural fluid. No evidence of pneumothorax. Mild left basilar atelectasis or scarring. Normal heart size. Mild tortuosity of the thoracic aorta. IMPRESSION: Decreased size of right pleural effusion post thoracentesis. Residual hazy opacity in the right mid and lower lung zone likely residual pleural fluid and airspace disease/atelectasis. No evidence  of postprocedural pneumothorax. Recommend continued radiographic follow-up. Electronically Signed   By: Jeb Levering M.D.   On: 06/03/2016 18:41   US Thoracentesis Asp Pleural Space W/img Guide  Result Date: 06/17/2016 INDICATION: 82 year old with right pleural effusion, bacterial. EXAM: ULTRASOUND GUIDED RIGHT THORACENTESIS MEDICATIONS: None. COMPLICATIONS: None immediate. PROCEDURE: An ultrasound guided thoracentesis was thoroughly discussed with the patient and questions answered. The benefits, risks, alternatives and complications were also discussed. The patient understands and wishes to proceed with the procedure. Written consent was obtained. Ultrasound was performed to localize and mark an adequate pocket of fluid in the right posterior chest. The area was then prepped and draped in the normal sterile fashion. 1% Lidocaine was used for local anesthesia. Under ultrasound guidance a 19 gauge, 10-cm, Yueh catheter was introduced. Thoracentesis was performed. The catheter was removed and a dressing applied. FINDINGS: A total of approximately 100 mL of brown purulent fluid was removed. Samples were sent to the laboratory as requested by the clinical team. Right pleural fluid is severely loculated. IMPRESSION: Successful ultrasound guided right thoracentesis yielding 100 mL of pleural fluid. Right pleural effusion is compatible with an empyema. Electronically Signed   By: Markus Daft M.D.   On: 06/17/2016 16:02    Labs:  CBC:  Recent Labs  06/03/16 1504 06/18/16 0441  WBC 13.1* 10.4  HGB 12.7* 10.8*  HCT 38.2* 32.2*  PLT 430 398    COAGS:  Recent Labs  06/18/16 0441  INR 1.27  APTT 41*    BMP:  Recent Labs  06/03/16 1504 06/18/16 0441  NA 133* 138  K 4.3 3.7  CL 98* 105  CO2 28 26  GLUCOSE 104* 111*  BUN 8 8  CALCIUM 9.2 8.8*  CREATININE 0.72 0.55*  GFRNONAA >60 >60  GFRAA >60 >60    LIVER FUNCTION TESTS:  Recent Labs  06/18/16 0441  BILITOT 0.1*  AST 10*    ALT 9*  ALKPHOS 66  PROT 6.5  ALBUMIN 2.4*    TUMOR MARKERS: No results for input(s): AFPTM, CEA, CA199, CHROMGRNA in the last 8760 hours.  Assessment and Plan:  Right empyema. Right thoracostomy.  Thank you for this interesting consult.  I greatly enjoyed meeting Jeffery [name] and look forward to participating in their care.  A copy of this report was sent to the requesting provider on this date.  Electronically Signed: Yoltzin Ransom, ART A 06/18/2016, 12:23 PM   I spent a total of 40 Minutes    in face to face in clinical consultation, greater than 50% of which was counseling/coordinating care for thoracostomy placement.

## 2016-06-18 NOTE — Care Management Important Message (Signed)
Important Message  Patient Details  Name: JABIN KALICH MRN: [id number] Date of Birth: 1930/12/08   Medicare Important Message Given:  Yes    Shelbie Ammons, RN 06/18/2016, 8:53 AM

## 2016-06-18 NOTE — Progress Notes (Signed)
Pharmacy Antibiotic Note  Jeffery [name] is a 82 y.o. male admitted on 06/17/2016 with empyema.  Pharmacy has been consulted for pen G dosing.  Plan: Pen G 5 million units IV q6h  Height: 5\' 10"  (177.8 cm) Weight: 169 lb 8 oz (76.9 kg) IBW/kg (Calculated) : 73  Temp (24hrs), Avg:98.4 F (36.9 C), Min:97.7 F (36.5 C), Max:99.4 F (37.4 C)   Recent Labs Lab 06/18/16 0441  WBC 10.4  CREATININE 0.55*    Estimated Creatinine Clearance: 72.2 mL/min (by C-G formula based on SCr of 0.55 mg/dL (L)).    Micro: 12/01 Pleural fluid: pending  No Known Allergies  Thank you for allowing pharmacy to be a part of this patient's care.  Melicia Esqueda C 06/18/2016 3:19 PM

## 2016-06-18 NOTE — Progress Notes (Signed)
Right sided chest tube unclamped and connected back to suction at 40 cm pressure 4 hrs following instillation of TPA and Pulmozyme, chest tube draining well and pt tolerating well.  [name], [place] Pager [phone] (please enter 7 digits) PCCM Consult Pager [phone] (please enter 7 digits)

## 2016-06-18 NOTE — Procedures (Signed)
R 16 Fr chest tube Frank pus No comp/EBL

## 2016-06-18 NOTE — Progress Notes (Signed)
Pt complains of a headache, he scores it a 5 on a scale 0-10. Pt is npo for a procedure later today, he does not have anything ordered for pain IV or PO. MD Alva Garnet was paged, waiting for a call back.

## 2016-06-18 NOTE — Progress Notes (Signed)
Dr. Genevive Bi and I at bedside, patient with Pigtail Catheter placed via CT scan with PUS draining from chest tube. TPA 10 mg and Pulmozyme 5mg  Injected into chest cavity after chest tube was clamped. Patient had no acute issues Will clamp for approx 4 hrs and unclamp Chest tube and place on 40 cm pressure  Plan: Perform TPA/Pulmozyme Injections daily for next 3 days CXR on Sunday Continue IV abx  Re-evaluate on Monday.  I have personally obtained a history, examined the patient, evaluated Pertinent laboratory and RadioGraphic/imaging results, and  formulated the assessment and plan   The Patient requires high complexity decision making for assessment and support, frequent evaluation and titration of therapies.    Corrin Parker, M.D.  [facility]  Medical Director [place] Director [facility] Cardio-Pulmonary Department

## 2016-06-19 ENCOUNTER — Inpatient Hospital Stay: Payer: Medicare Other

## 2016-06-19 LAB — MISC LABCORP TEST (SEND OUT): LABCORP TEST CODE: 88062

## 2016-06-19 NOTE — Progress Notes (Signed)
Name: Jeffery [name] MRN: [id number] DOB: Mar 01, 1931    ADMISSION DATE:  06/17/2016 CONSULTATION DATE:  06/17/2016  PT SUMMARY: Treated for PNA a few weeks ago Developed pleural effusion noted on CXR 05/25/16 Saw Dr Ashby Dawes 06/01/16 in consultation Thoracentesis 06/03/16 - 150 cc of fluid described as brown/turbid             Pleural fluid with high WBC, LDH and GS positive for microaerophilic strep   CXR: Increased size of R pleural effusion which appears loculated    REFERRING MD :  Dr. Anselm Pancoast  CHIEF COMPLAINT:  Right sided empyema  BRIEF PATIENT DESCRIPTION: 82 yo male presented to [facility] on 11/30 for a right sided thoracentesis due to a large posterior loculated pleural effusion with the removal of 100 ml brown purulent fluid. However, due to the inability to drain the right sided pleural effusion thoroughly he will need CT biopsy with placement of drainage tube or catheter placement with instillation of thrombolytic agents.   SIGNIFICANT EVENTS  11/30-US Guided Thoracentesis for right sided empyema with removal of 100 ml brown purulent fluid 11/30-Pt admitted to [facility] due to need of CT biopsy for right sided chest tube placement with instillation of thrombolytic agents  12/1 CT guided pigtain RT lung pending 12/1 pulmozyme and TPA therapy infused into chest tube 12/2 pulmozyme and TPA therapy infused into chest tube  12/2-bedside procedure patient with Pigtail Catheter placed via CT scan with PUS draining from chest tube. TPA 10 mg and Pulmozyme 5mg  Injected into chest cavity after chest tube was clamped. Patient had no acute issues Will clamp for approx 4 hrs and unclamp Chest tube and place on 40 cm pressure      STUDIES:  CT Chest 11/30>>Large posterior loculated pleural effusion on the right measures 11 x 6 cm compatible with empyema. Small gas bubble likely due to recent thoracentesis. Compressive atelectasis/ infiltrate in the right lower lobe with a small  amount of infiltrate in the right upper lobe. 1 cm patchy density left lower lobe posteriorly may represent scar or infiltrate. Mild splenomegaly. Coronary artery calcification. No definite underlying mass lesion identified.  HISTORY OF PRESENT ILLNESS:   This is an 82 yo male with a PMH of Hyperlipidemia and Pneumonia.  He was referred by Dr. Hall Busing to Pulmonologist Dr. Ashby Dawes for a consultation on 11/14 due to Pneumonia diagnosis 8 weeks prior to outpatient pulmonology consultation .  The pt had a total of three rounds of abx and 1 round of prednisone total due to c/o persistent cough and dyspnea, however he still c/o shortness of breath and voice hoarseness despite treatments prompting pulmonology consultation.  Chest xray on 11/6 revealed small to moderate right pleural effusion with atelectasis that was not seen on previous xrays.  There was suspicion of a right sided empyema, therefore Dr. Ashby Dawes recommended a right sided thoracentesis.  The thoracentesis was performed on 11/16 with the removal of 150 ml of dark thick fluid, and following procedure an outpatient follow-up appointment was scheduled with pulmonology on 11/30. During follow-up appointment with Pulmonologist Dr. Alva Garnet on 11/30 a CT Chest was ordered that revealed a large posterior loculated pleural effusion, therefore a repeat right sided thoracentesis was performed by IR on 11/30 with drainage of 100 ml brown purulent fluid.  However, due to the inability to drain the right sided pleural effusion thoroughly the pt subsequently required admission to [facility] for a CT biopsy with placement of a right sided drainage tube or catheter placement  with plans for instillation of thrombolytic agents (procedure planned for 12/1).    SUBJECTIVE:   Has chest wall pain Patient feels well today No acute issues Episodic delerium noted last night   REVIEW OF SYSTEMS:  Positives in BOLD Constitutional: Negative for fever, chills, weight  loss, malaise/fatigue and diaphoresis.  HENT: Negative for hearing loss, ear pain, nosebleeds, congestion, sore throat, neck pain, tinnitus and ear discharge.   Eyes: Negative for blurred vision, double vision, photophobia, pain, discharge and redness.  Respiratory: Negative for cough, hemoptysis, sputum production, shortness of breath, wheezing and stridor.   Cardiovascular: Negative for chest pain, palpitations, orthopnea, claudication, leg swelling and PND.  Skin: Negative for itching and rash.  Neurological: Negative for dizziness, tingling, tremors, sensory change, speech change, focal weakness, seizures, loss of consciousness, weakness and headaches.      VITAL SIGNS: Temp:  [98 F (36.7 C)-98.3 F (36.8 C)] 98 F (36.7 C) (12/02 0512) Pulse Rate:  [89-117] 99 (12/02 0512) Resp:  [15-28] 23 (12/02 0512) BP: (78-128)/(50-74) 120/70 (12/02 0512) SpO2:  [95 %-99 %] 95 % (12/02 0512)  PHYSICAL EXAMINATION: General:  Well developed, well nourished Caucasian male  Neuro:  Alert and oriented, follows commands, PERRLA HEENT:  Supple, no JVD Cardiovascular: Sinus tach, s1s2, no M/R/G Lungs:  Diminished right side, clear on left side, even, non labored Abdomen: +BS x4, soft, non tender, non distended Musculoskeletal:  Normal bulk and tone  Skin:  No rashes or lesions   Recent Labs Lab 06/18/16 0441  NA 138  K 3.7  CL 105  CO2 26  BUN 8  CREATININE 0.55*  GLUCOSE 111*    Recent Labs Lab 06/18/16 0441  HGB 10.8*  HCT 32.2*  WBC 10.4  PLT 398   Dg Chest 1 View  Result Date: 06/17/2016 CLINICAL DATA:  Followup right thoracentesis. EXAM: CHEST 1 VIEW COMPARISON:  Earlier same day FINDINGS: Heart size is normal. Left chest remains clear. Similar radiographic appearance of right lower chest density. No pneumothorax. IMPRESSION: Persistent right chest density on this one view, little changed. No pneumothorax. Electronically Signed   By: Nelson Chimes M.D.   On: 06/17/2016  16:41   Dg Chest 2 View  Result Date: 06/17/2016 CLINICAL DATA:  Status post thoracentesis 2 weeks ago. Patient has diagnosis of pneumonia. Patient reports fairly stable shortness of breath and other symptoms. Remote history of smoking. EXAM: CHEST  2 VIEW COMPARISON:  Post thoracentesis radiograph of June 03, 2016 and pre thoracentesis PA and lateral chest x-ray of May 25, 2016 FINDINGS: The right lower lobe is completely opacified. The right middle and upper lobes are grossly clear. The left lung is clear. There is no left-sided pleural effusion. The heart and pulmonary vascularity are normal. The mediastinum is normal in width. The bony thorax is unremarkable. IMPRESSION: Probable interval reaccumulation of large right pleural effusion. This could be verified with directed ultrasound. Density throughout the right lower lobe is consistent with atelectasis or pneumonia. These results will be called to the ordering clinician or representative by the Radiologist Assistant, and communication documented in the PACS or zVision Dashboard. Electronically Signed   By: David  Martinique M.D.   On: 06/17/2016 11:20   Ct Chest W Contrast  Result Date: 06/17/2016 CLINICAL DATA:  Pneumonia, empyema EXAM: CT CHEST WITH CONTRAST TECHNIQUE: Multidetector CT imaging of the chest was performed during intravenous contrast administration. CONTRAST:  33mL ISOVUE-300 IOPAMIDOL (ISOVUE-300) INJECTION 61% COMPARISON:  Chest two-view 06/17/2016 FINDINGS: Cardiovascular: Thoracic aorta within  normal limits. Pulmonary arteries normal. Coronary artery calcification. Aortic valve calcification. Negative for pericardial effusion. Mediastinum/Nodes: No pathologic adenopathy. Fluid in the superior pericardial recess noted. Lungs/Pleura: Large posterior loculated pleural effusion on the right measuring 11 x 6 cm. Small gas bubble within the fluid may be related to thoracentesis. There is compressive atelectasis/ infiltrate in the  right lower lobe. Small amount of infiltrate is present in the right upper lobe posteriorly and laterally. Subpleural patchy density left lower lobe measuring under 1 cm, possibly an area of scar or infiltrate. No pleural effusion on the left Upper Abdomen: Small low-density lesions in the left lobe liver possibly cysts. Low-density in the lateral left liver adjacent the falciform ligament likely due to fatty liver. Mild splenomegaly. 1 cm right renal stone. Musculoskeletal: Moderately severe compression fracture L1 appears chronic. No acute skeletal abnormality. IMPRESSION: Large posterior loculated pleural effusion on the right measures 11 x 6 cm compatible with empyema. Small gas bubble likely due to recent thoracentesis. Compressive atelectasis/ infiltrate in the right lower lobe with a small amount of infiltrate in the right upper lobe. 1 cm patchy density left lower lobe posteriorly may represent scar or infiltrate. Mild splenomegaly Coronary artery calcification No definite underlying mass lesion identified. Electronically Signed   By: Franchot Gallo M.D.   On: 06/17/2016 12:09   Ct Guided Needle Placement  Result Date: 06/18/2016 INDICATION: Right empyema EXAM: CT GUIDED DRAINAGE OF A RIGHT EMPYEMA MEDICATIONS: The patient is currently admitted to the hospital and receiving intravenous antibiotics. The antibiotics were administered within an appropriate time frame prior to the initiation of the procedure. ANESTHESIA/SEDATION: Three mg IV Versed 100 mcg IV Fentanyl Moderate Sedation Time:  15 The patient was continuously monitored during the procedure by the interventional radiology nurse under my direct supervision. COMPLICATIONS: None immediate. TECHNIQUE: Informed written consent was obtained from the patient after a thorough discussion of the procedural risks, benefits and alternatives. All questions were addressed. Maximal Sterile Barrier Technique was utilized including caps, mask, sterile gowns,  sterile gloves, sterile drape, hand hygiene and skin antiseptic. A timeout was performed prior to the initiation of the procedure. PROCEDURE: The right chest at the posterior axillary line was prepped with ChloraPrep in a sterile fashion, and a sterile drape was applied covering the operative field. A sterile gown and sterile gloves were used for the procedure. Local anesthesia was provided with 1% Lidocaine. Under CT guidance, an 18 gauge needle was inserted into the right loculated pleural fluid collection and removed over an Amplatz. A 16 French dilator followed by 16 French pigtail drain were inserted over the wire. It was looped and string fixed, then sewn to the skin. Frank pus was aspirated. It was then attached to a Pleurovac unit. FINDINGS: Images document placement of a 68 French right thoracostomy into an empyema. IMPRESSION: Successful right 24 French thoracostomy for empyema. Electronically Signed   By: Marybelle Killings M.D.   On: 06/18/2016 13:41   Dg Chest Port 1 View  Result Date: 06/19/2016 CLINICAL DATA:  Empyema.  Evaluate right chest tube. EXAM: PORTABLE CHEST 1 VIEW COMPARISON:  June 18, 2016 FINDINGS: A new right chest tube is identified with decreasing fluid in the right pleural space. Mild atelectasis in the right base remains. No other interval changes or acute abnormalities. IMPRESSION: New right chest tube with decreasing right pleural fluid. Electronically Signed   By: Dorise Bullion III M.D   On: 06/19/2016 07:52   Dg Chest Port 1 View  Result  Date: 06/18/2016 CLINICAL DATA:  Empyema EXAM: PORTABLE CHEST 1 VIEW COMPARISON:  Yesterday FINDINGS: Right basilar opacity correlating with prior chest CT and history of empyema. Density is increased in diaphragm is less well-defined. The left lung is clear. Normal heart size and mediastinal contours. No pneumothorax. IMPRESSION: Empyema at the right base with mildly increased increased density from yesterday. Electronically Signed   By:  Monte Fantasia M.D.   On: 06/18/2016 08:03   US Thoracentesis Asp Pleural Space W/img Guide  Result Date: 06/17/2016 INDICATION: 82 year old with right pleural effusion, bacterial. EXAM: ULTRASOUND GUIDED RIGHT THORACENTESIS MEDICATIONS: None. COMPLICATIONS: None immediate. PROCEDURE: An ultrasound guided thoracentesis was thoroughly discussed with the patient and questions answered. The benefits, risks, alternatives and complications were also discussed. The patient understands and wishes to proceed with the procedure. Written consent was obtained. Ultrasound was performed to localize and mark an adequate pocket of fluid in the right posterior chest. The area was then prepped and draped in the normal sterile fashion. 1% Lidocaine was used for local anesthesia. Under ultrasound guidance a 19 gauge, 10-cm, Yueh catheter was introduced. Thoracentesis was performed. The catheter was removed and a dressing applied. FINDINGS: A total of approximately 100 mL of brown purulent fluid was removed. Samples were sent to the laboratory as requested by the clinical team. Right pleural fluid is severely loculated. IMPRESSION: Successful ultrasound guided right thoracentesis yielding 100 mL of pleural fluid. Right pleural effusion is compatible with an empyema. Electronically Signed   By: Markus Daft M.D.   On: 06/17/2016 16:02    ASSESSMENT / PLAN: Large posterior loculated pleural effusion on the right measures 11 x 6 cm compatible with empyema S/p Right sided thoracentesis on 11/30 P: Supplemental O2 as needed to maintain O2 sats >92% or above S/p CT  with placement of drainage tube  placement with instillation of thrombolytic agents-12/1  and 12/2 -continue Penicillin G -Dr Genevive Bi consulted-plan for  Decortication/VATS after assessment of  TPA/pulmozyme therapy    I have personally obtained a history, examined the patient, evaluated Pertinent laboratory and RadioGraphic/imaging results, and  formulated the  assessment and plan   The Patient requires high complexity decision making for assessment and support, frequent evaluation and titration of therapies.   Patient satisfied with Plan of action and management. All questions answered  Corrin Parker, M.D.  [facility]  Medical Director [place] Director [facility] Cardio-Pulmonary Department

## 2016-06-19 NOTE — Progress Notes (Signed)
Nurse called to room by Nurse Tech this morning following report. Patient found in bathroom with with Chest tube and all suction equipment  In the bathroom, all equipment and connections appeared to  still be intact. Patient had taken suction canister off the wall and walked to the bathroom Patient was confused and requesting his bathrobe and thought he was at home. Patient was re-oriented, assisted with ambulation and chest tube site checked. Order for tele sitter placed and bed alarm activated. Patient made aware that he is not to get out of bed without assistance and that he needs to utilize the call bell for assistance. Patient informed and education on the need for Telesitter, patient as best he can at this time- verbalized understanding and consent. Md to be paged and made aware of patient's current status.

## 2016-06-19 NOTE — Progress Notes (Signed)
Nurse called to room by patient, IV noted to be infiltrated, small nodule noted on right wrist. IV stopped immediately and IV dc'ed cath intact- dressing applied. MD paged and made aware. Per MD- Dr. Mortimer Fries, ok to apply ice. Will continue to monitor. New I V started in L Forearm. Estill Bamberg, Charge RN made aware as well.

## 2016-06-19 NOTE — Progress Notes (Signed)
06/19/2016  Subjective: Patient has a right-sided pigtail catheter through which TPA was injected yesterday with Dr. Genevive Bi. Drain had 250 cc of fluid drainage yesterday. Patient reports he is feeling better with no significant right-sided pain.  Vital signs: Temp:  [98 F (36.7 C)-98.4 F (36.9 C)] 98.4 F (36.9 C) (12/02 1412) Pulse Rate:  [99-117] 115 (12/02 1412) Resp:  [20-23] 23 (12/02 0512) BP: (120-134)/(70-81) 134/81 (12/02 1412) SpO2:  [95 %-97 %] 97 % (12/02 1412)   Intake/Output: 12/01 0701 - 12/02 0700 In: 740 [P.O.:240; IV Piggyback:500] Out: 250 [Chest Tube:250] Last BM Date: 06/18/16 (per patient report)  Physical Exam: Constitutional: No acute distress Pulm:  Diminished breath sounds on the right side otherwise clear on the left. No wheezes. Chest tube in place to suction with no air leak   Labs:   Recent Labs  06/18/16 0441  WBC 10.4  HGB 10.8*  HCT 32.2*  PLT 398    Recent Labs  06/18/16 0441  NA 138  K 3.7  CL 105  CO2 26  GLUCOSE 111*  BUN 8  CREATININE 0.55*  CALCIUM 8.8*    Recent Labs  06/18/16 0441  LABPROT 16.0*  INR 1.27    Imaging: Dg Chest Port 1 View  Result Date: 06/19/2016 CLINICAL DATA:  Empyema.  Evaluate right chest tube. EXAM: PORTABLE CHEST 1 VIEW COMPARISON:  June 18, 2016 FINDINGS: A new right chest tube is identified with decreasing fluid in the right pleural space. Mild atelectasis in the right base remains. No other interval changes or acute abnormalities. IMPRESSION: New right chest tube with decreasing right pleural fluid. Electronically Signed   By: Dorise Bullion III M.D   On: 06/19/2016 07:52    Assessment/Plan: 82 year old male with right-sided empyema status post percutaneous pigtail catheter placement on the right.  -Continue chest tube to suction. -Continue IV antibiotics. -Patient may require operative management for his empyema if there is no significant improvement by next week.   Melvyn Neth, [place]

## 2016-06-20 DIAGNOSIS — J90 Pleural effusion, not elsewhere classified: Secondary | ICD-10-CM

## 2016-06-20 LAB — CHOLESTEROL, BODY FLUID: Cholesterol, Fluid: 316 mg/dL

## 2016-06-20 LAB — BODY FLUID CULTURE

## 2016-06-20 MED ORDER — POLYETHYLENE GLYCOL 3350 17 G PO PACK
17.0000 g | PACK | Freq: Every day | ORAL | Status: DC | PRN
  Administered 2016-06-20 – 2016-06-21 (×2): 17 g via ORAL
  Filled 2016-06-20 (×2): qty 1

## 2016-06-20 NOTE — Progress Notes (Signed)
Pt's Leukemia Lymphoma Panel is showing that it has new results. When clicking on the results it reads: "COMMENT   Comment: (NOTE)  Performed At: [facility]  [place], [place] [id number]  Lindon Romp MD [id number]"   I called the phone number listed and was told that an explanation of this lab value could not be provided as I was calling outside of normal business hours. The customer representative asked that someone call back tomorrow with any questions regarding the results. I updated Dr. Mortimer Fries via phone and he stated it would be addressed tomorrow.

## 2016-06-20 NOTE — Progress Notes (Signed)
Name: Jeffery [name] MRN: [id number] DOB: February 03, 1931    ADMISSION DATE:  06/17/2016 CONSULTATION DATE:  06/17/2016  PT SUMMARY: Treated for PNA a few weeks ago Developed pleural effusion noted on CXR 05/25/16 Saw Dr Ashby Dawes 06/01/16 in consultation Thoracentesis 06/03/16 - 150 cc of fluid described as brown/turbid             Pleural fluid with high WBC, LDH and GS positive for microaerophilic strep   CXR: Increased size of R pleural effusion which appears loculated    REFERRING MD :  Dr. Anselm Pancoast  CHIEF COMPLAINT:  Right sided empyema  BRIEF PATIENT DESCRIPTION: 82 yo male presented to [facility] on 11/30 for a right sided thoracentesis due to a large posterior loculated pleural effusion with the removal of 100 ml brown purulent fluid. However, due to the inability to drain the right sided pleural effusion thoroughly he will need CT biopsy with placement of drainage tube or catheter placement with instillation of thrombolytic agents.   SIGNIFICANT EVENTS  11/30-US Guided Thoracentesis for right sided empyema with removal of 100 ml brown purulent fluid 11/30-Pt admitted to [facility] due to need of CT biopsy for right sided chest tube placement with instillation of thrombolytic agents  12/1 CT guided pigtain RT lung pending 12/1 pulmozyme and TPA therapy infused into chest tube 12/2 pulmozyme and TPA therapy infused into chest tube 12/3 pulmozyme and TPA therapy infused into chest tube  12/3-bedside procedure patient with Pigtail Catheter placed via CT scan with PUS draining from chest tube. TPA 10 mg and Pulmozyme 5mg  Injected into chest cavity after chest tube was clamped. Patient had no acute issues Will clamp for approx 4 hrs and unclamp Chest tube and place on 40 cm pressure      STUDIES:  CT Chest 11/30>>Large posterior loculated pleural effusion on the right measures 11 x 6 cm compatible with empyema. Small gas bubble likely due to recent thoracentesis. Compressive  atelectasis/ infiltrate in the right lower lobe with a small amount of infiltrate in the right upper lobe. 1 cm patchy density left lower lobe posteriorly may represent scar or infiltrate. Mild splenomegaly. Coronary artery calcification. No definite underlying mass lesion identified.  HISTORY OF PRESENT ILLNESS:   This is an 82 yo male with a PMH of Hyperlipidemia and Pneumonia.  He was referred by Dr. Hall Busing to Pulmonologist Dr. Ashby Dawes for a consultation on 11/14 due to Pneumonia diagnosis 8 weeks prior to outpatient pulmonology consultation .  The pt had a total of three rounds of abx and 1 round of prednisone total due to c/o persistent cough and dyspnea, however he still c/o shortness of breath and voice hoarseness despite treatments prompting pulmonology consultation.  Chest xray on 11/6 revealed small to moderate right pleural effusion with atelectasis that was not seen on previous xrays.  There was suspicion of a right sided empyema, therefore Dr. Ashby Dawes recommended a right sided thoracentesis.  The thoracentesis was performed on 11/16 with the removal of 150 ml of dark thick fluid, and following procedure an outpatient follow-up appointment was scheduled with pulmonology on 11/30. During follow-up appointment with Pulmonologist Dr. Alva Garnet on 11/30 a CT Chest was ordered that revealed a large posterior loculated pleural effusion, therefore a repeat right sided thoracentesis was performed by IR on 11/30 with drainage of 100 ml brown purulent fluid.  However, due to the inability to drain the right sided pleural effusion thoroughly the pt subsequently required admission to [facility] for a CT biopsy with placement  of a right sided drainage tube or catheter placement with plans for instillation of thrombolytic agents (procedure planned for 12/1).    SUBJECTIVE:   Patient feels well today No acute issues delirium improved   REVIEW OF SYSTEMS:  Positives in BOLD Constitutional: Negative for  fever, chills, weight loss, malaise/fatigue and diaphoresis.  HENT: Negative for hearing loss, ear pain, nosebleeds, congestion, sore throat, neck pain, tinnitus and ear discharge.   Eyes: Negative for blurred vision, double vision, photophobia, pain, discharge and redness.  Respiratory: Negative for cough, hemoptysis, sputum production, shortness of breath, wheezing and stridor.   Cardiovascular: Negative for chest pain, palpitations, orthopnea, claudication, leg swelling and PND.  Skin: Negative for itching and rash.  Neurological: Negative for dizziness, tingling, tremors, sensory change, speech change, focal weakness, seizures, loss of consciousness, weakness and headaches.      VITAL SIGNS: Temp:  [97.7 F (36.5 C)-98.6 F (37 C)] 97.7 F (36.5 C) (12/03 0505) Pulse Rate:  [72-115] 72 (12/03 0505) Resp:  [16-20] 20 (12/03 0505) BP: (116-134)/(64-81) 116/64 (12/03 0505) SpO2:  [96 %-97 %] 97 % (12/03 0505)  PHYSICAL EXAMINATION: General:  Well developed, well nourished Caucasian male  Neuro:  Alert and oriented, follows commands, PERRLA HEENT:  Supple, no JVD Cardiovascular: Sinus tach, s1s2, no M/R/G Lungs:  Diminished right side, clear on left side, even, non labored Abdomen: +BS x4, soft, non tender, non distended Musculoskeletal:  Normal bulk and tone  Skin:  No rashes or lesions   Recent Labs Lab 06/18/16 0441  NA 138  K 3.7  CL 105  CO2 26  BUN 8  CREATININE 0.55*  GLUCOSE 111*    Recent Labs Lab 06/18/16 0441  HGB 10.8*  HCT 32.2*  WBC 10.4  PLT 398   Ct Guided Needle Placement  Result Date: 06/18/2016 INDICATION: Right empyema EXAM: CT GUIDED DRAINAGE OF A RIGHT EMPYEMA MEDICATIONS: The patient is currently admitted to the hospital and receiving intravenous antibiotics. The antibiotics were administered within an appropriate time frame prior to the initiation of the procedure. ANESTHESIA/SEDATION: Three mg IV Versed 100 mcg IV Fentanyl Moderate  Sedation Time:  15 The patient was continuously monitored during the procedure by the interventional radiology nurse under my direct supervision. COMPLICATIONS: None immediate. TECHNIQUE: Informed written consent was obtained from the patient after a thorough discussion of the procedural risks, benefits and alternatives. All questions were addressed. Maximal Sterile Barrier Technique was utilized including caps, mask, sterile gowns, sterile gloves, sterile drape, hand hygiene and skin antiseptic. A timeout was performed prior to the initiation of the procedure. PROCEDURE: The right chest at the posterior axillary line was prepped with ChloraPrep in a sterile fashion, and a sterile drape was applied covering the operative field. A sterile gown and sterile gloves were used for the procedure. Local anesthesia was provided with 1% Lidocaine. Under CT guidance, an 18 gauge needle was inserted into the right loculated pleural fluid collection and removed over an Amplatz. A 16 French dilator followed by 16 French pigtail drain were inserted over the wire. It was looped and string fixed, then sewn to the skin. Frank pus was aspirated. It was then attached to a Pleurovac unit. FINDINGS: Images document placement of a 55 French right thoracostomy into an empyema. IMPRESSION: Successful right 46 French thoracostomy for empyema. Electronically Signed   By: Marybelle Killings M.D.   On: 06/18/2016 13:41   Dg Chest Port 1 View  Result Date: 06/19/2016 CLINICAL DATA:  Empyema.  Evaluate right  chest tube. EXAM: PORTABLE CHEST 1 VIEW COMPARISON:  June 18, 2016 FINDINGS: A new right chest tube is identified with decreasing fluid in the right pleural space. Mild atelectasis in the right base remains. No other interval changes or acute abnormalities. IMPRESSION: New right chest tube with decreasing right pleural fluid. Electronically Signed   By: Dorise Bullion III M.D   On: 06/19/2016 07:52    ASSESSMENT / PLAN: Large posterior  loculated pleural effusion on the right measures 11 x 6 cm compatible with empyema S/p Right sided thoracentesis on 11/30 P: Supplemental O2 as needed to maintain O2 sats >92% or above S/p CT  with placement of drainage tube  placement with instillation of thrombolytic agents-12/1  and 12/2, and 12/3 -continue Penicillin G -Dr Genevive Bi consulted-plan for  Decortication/VATS after assessment of  TPA/pulmozyme therapy Will Obtain CXR in AM    I have personally obtained a history, examined the patient, evaluated Pertinent laboratory and RadioGraphic/imaging results, and  formulated the assessment and plan   The Patient requires high complexity decision making for assessment and support, frequent evaluation and titration of therapies.   Patient satisfied with Plan of action and management. All questions answered  Corrin Parker, M.D.  [facility]  Medical Director [place] Director [facility] Cardio-Pulmonary Department

## 2016-06-20 NOTE — Care Management Important Message (Signed)
Important Message  Patient Details  Name: Jeffery [name] MRN: [id number] Date of Birth: Nov 04, 1930   Medicare Important Message Given:  Yes    Raheem Kolbe A, RN 06/20/2016, 4:11 PM

## 2016-06-20 NOTE — Progress Notes (Signed)
06/20/2016  Subjective: Patient reports that he's feeling well. No acute events overnight. Chest tube is draining well with a TPA that has been ordered. He is due for one more round today.  Vital signs: Temp:  [97.7 F (36.5 C)-98.6 F (37 C)] 97.7 F (36.5 C) (12/03 0505) Pulse Rate:  [72-115] 72 (12/03 0505) Resp:  [16-20] 20 (12/03 0505) BP: (116-134)/(64-81) 116/64 (12/03 0505) SpO2:  [96 %-97 %] 97 % (12/03 0505)   Intake/Output: 12/02 0701 - 12/03 0700 In: 2072 [P.O.:1322; IV Piggyback:750] Out: 1620 [Urine:695; Chest Tube:925] Last BM Date: 06/17/16  Physical Exam: Constitutional: No acute distress Pulm: Lungs are clear to auscultation bilaterally with better air movement on the right.  Right-sided pigtail catheter in place with no air leak   Labs:   Recent Labs  06/18/16 0441  WBC 10.4  HGB 10.8*  HCT 32.2*  PLT 398    Recent Labs  06/18/16 0441  NA 138  K 3.7  CL 105  CO2 26  GLUCOSE 111*  BUN 8  CREATININE 0.55*  CALCIUM 8.8*    Recent Labs  06/18/16 0441  LABPROT 16.0*  INR 1.27    Imaging: No results found.  Assessment/Plan: 82 year old male with a right-sided empyema status post percutaneous pigtail catheter placement.  -Patient is due for one more round of TPA today. -Continue chest tube to suction. -chest x-ray today. -Dr. Genevive Bi will follow tomorrow for assessment of whether patient will need VATS/decortication   Melvyn Neth, [place]

## 2016-06-21 ENCOUNTER — Inpatient Hospital Stay: Payer: Medicare Other

## 2016-06-21 LAB — CYTOLOGY - NON PAP

## 2016-06-21 NOTE — Progress Notes (Signed)
Dr.Ramchandran called me- pt is with empyema- s/p tube and drainage. Thorasic surgery is consulted on case. Pt is hemodynamically stable to be transferred to hospitalist service. We will resume care from tomorrow.

## 2016-06-21 NOTE — Progress Notes (Signed)
Name: Jeffery [name] MRN: [id number] DOB: 02-Oct-1930    ADMISSION DATE:  06/17/2016 CONSULTATION DATE:  06/17/2016  PT SUMMARY: Treated for PNA a few weeks ago Developed pleural effusion noted on CXR 05/25/16 Saw Dr Ashby Dawes 06/01/16 in consultation Thoracentesis 06/03/16 - 150 cc of fluid described as brown/turbid             Pleural fluid with high WBC, LDH and GS positive for microaerophilic strep   CXR: Increased size of R pleural effusion which appears loculated    REFERRING MD :  Dr. Anselm Pancoast  CHIEF COMPLAINT:  Right sided empyema  BRIEF PATIENT DESCRIPTION: 82 yo male presented to [facility] on 11/30 for a right sided thoracentesis due to a large posterior loculated pleural effusion with the removal of 100 ml brown purulent fluid. However, due to the inability to drain the right sided pleural effusion thoroughly he will need CT biopsy with placement of drainage tube or catheter placement with instillation of thrombolytic agents.   SIGNIFICANT EVENTS  11/30-US Guided Thoracentesis for right sided empyema with removal of 100 ml brown purulent fluid 11/30-Pt admitted to [facility] due to need of CT biopsy for right sided chest tube placement with instillation of thrombolytic agents  12/1 CT guided pigtain RT lung pending 12/1 pulmozyme and TPA therapy infused into chest tube 12/2 pulmozyme and TPA therapy infused into chest tube 12/3 pulmozyme and TPA therapy infused into chest tube  12/3-bedside procedure patient with Pigtail Catheter placed via CT scan with PUS draining from chest tube. TPA 10 mg and Pulmozyme 5mg  Injected into chest cavity after chest tube was clamped. Patient had no acute issues Will clamp for approx 4 hrs and unclamp Chest tube and place on 40 cm pressure      STUDIES:  CT Chest 11/30>>Large posterior loculated pleural effusion on the right measures 11 x 6 cm compatible with empyema. Small gas bubble likely due to recent thoracentesis. Compressive  atelectasis/ infiltrate in the right lower lobe with a small amount of infiltrate in the right upper lobe. 1 cm patchy density left lower lobe posteriorly may represent scar or infiltrate. Mild splenomegaly. Coronary artery calcification. No definite underlying mass lesion identified.    SUBJECTIVE:   Patient feels well today No acute issues    REVIEW OF SYSTEMS:  Positives in BOLD Constitutional: Negative for fever, chills, weight loss, malaise/fatigue and diaphoresis.  HENT: Negative for hearing loss, ear pain, nosebleeds, congestion, sore throat, neck pain, tinnitus and ear discharge.   Eyes: Negative for blurred vision, double vision, photophobia, pain, discharge and redness.  Respiratory: Negative for cough, hemoptysis, sputum production, shortness of breath, wheezing and stridor.   Cardiovascular: Negative for chest pain, palpitations, orthopnea, claudication, leg swelling and PND.  Skin: Negative for itching and rash.  Neurological: Negative for dizziness, tingling, tremors, sensory change, speech change, focal weakness, seizures, loss of consciousness, weakness and headaches.      VITAL SIGNS: Temp:  [97.4 F (36.3 C)-98.2 F (36.8 C)] 97.4 F (36.3 C) (12/04 0413) Pulse Rate:  [95-120] 95 (12/04 0413) Resp:  [20] 20 (12/04 0413) BP: (109-132)/(65-70) 128/66 (12/04 0413) SpO2:  [95 %-97 %] 97 % (12/04 0413)  PHYSICAL EXAMINATION: General:  Well developed, well nourished Caucasian male  Neuro:  Alert and oriented, follows commands, PERRLA HEENT:  Supple, no JVD Cardiovascular: Sinus tach, s1s2, no M/R/G Lungs:  Diminished right side, clear on left side, even, non labored Abdomen: +BS x4, soft, non tender, non distended Musculoskeletal:  Normal bulk  and tone  Skin:  No rashes or lesions   Recent Labs Lab 06/18/16 0441  NA 138  K 3.7  CL 105  CO2 26  BUN 8  CREATININE 0.55*  GLUCOSE 111*    Recent Labs Lab 06/18/16 0441  HGB 10.8*  HCT 32.2*  WBC  10.4  PLT 398   Dg Chest 2 View  Result Date: 06/21/2016 CLINICAL DATA:  Right empyema with pigtail catheter EXAM: CHEST  2 VIEW COMPARISON:  06/19/2016 chest radiograph. FINDINGS: Stable position of right basilar pleural catheter. Stable cardiomediastinal silhouette with normal heart size. No pneumothorax. Stable small right basilar pleural effusion. No left pleural effusion . No pulmonary edema. Stable mild patchy right lung base opacity. No new consolidative airspace disease. IMPRESSION: 1. Stable small right basilar pleural effusion with stable right pleural catheter position. No pneumothorax. 2. Stable mild patchy right lung base opacity. Electronically Signed   By: Ilona Sorrel M.D.   On: 06/21/2016 08:32    ASSESSMENT / PLAN: Large posterior loculated pleural effusion on the right measures 11 x 6 cm compatible with empyema S/p Right sided thoracentesis on 11/30-- continue thick, copious secretion from right chest tube.  P: Supplemental O2 as needed to maintain O2 sats >92% or above S/p CT  with placement of drainage tube  placement with instillation of thrombolytic agents-12/1  and 12/2, and 12/3 -continue Penicillin G -Dr Genevive Bi consulted-plan for  Decortication/VATS after assessment of  TPA/pulmozyme therapy --Discussed transfer with Dr. Anselm Jungling.  [name], M.D.  06/21/2016

## 2016-06-22 ENCOUNTER — Other Ambulatory Visit: Payer: Self-pay | Admitting: *Deleted

## 2016-06-22 ENCOUNTER — Inpatient Hospital Stay: Payer: Medicare Other

## 2016-06-22 DIAGNOSIS — J869 Pyothorax without fistula: Secondary | ICD-10-CM

## 2016-06-22 DIAGNOSIS — J90 Pleural effusion, not elsewhere classified: Secondary | ICD-10-CM

## 2016-06-22 LAB — COMP PANEL: LEUKEMIA/LYMPHOMA

## 2016-06-22 MED ORDER — ACETAMINOPHEN 325 MG PO TABS
650.0000 mg | ORAL_TABLET | Freq: Four times a day (QID) | ORAL | Status: DC | PRN
  Administered 2016-06-22: 06:00:00 650 mg via ORAL

## 2016-06-22 MED ORDER — AMOXICILLIN-POT CLAVULANATE 875-125 MG PO TABS: 1.0000 | ORAL_TABLET | Freq: Two times a day (BID) | ORAL | 0 refills | Status: DC

## 2016-06-22 MED ORDER — AMOXICILLIN-POT CLAVULANATE 875-125 MG PO TABS
1.0000 | ORAL_TABLET | Freq: Two times a day (BID) | ORAL | Status: DC
  Administered 2016-06-22: 10:00:00 1 via ORAL
  Filled 2016-06-22: qty 1

## 2016-06-22 NOTE — Care Management (Addendum)
Discussed home health agencies with Mr & Ms. Sole at the bedside. [place]. Will fax information to [facility].  Expecting Dr Stevenson Clinch  to pull chest tube out today.  Dressing applied.  Discharge to home today per Dr. Fritzi Mandes. Wife will transport. Shelbie Ammons RN MSN CCM Care Management

## 2016-06-22 NOTE — Progress Notes (Signed)
Patient discharged home per MD orders. Dr. Stevenson Clinch pulled chest tube without complications and chest x ray was performed showing no pneumothorax. All discharge instructions given and all questions answered.

## 2016-06-22 NOTE — Progress Notes (Signed)
Excellent response to intrapleural thrombolytics.  CT shows minimal pleural fluid.   Chest drain could be removed or converted to Heimlich valve I would be happy to follow as an outpatient with you  Marta Lamas

## 2016-06-22 NOTE — Progress Notes (Signed)
Update: CXR reviewed, no PTX, clear for d\c from a pulmonary standpoint.   Vilinda Boehringer, MD [place] [facility] Pager [phone] (please enter 7-digits) On Call Pager - [phone] (please enter 7-digits)

## 2016-06-22 NOTE — Progress Notes (Signed)
cxr ordered

## 2016-06-22 NOTE — Progress Notes (Signed)
Name: Jeffery [name] MRN: [id number] DOB: 1930/12/15    ADMISSION DATE:  06/17/2016   SIGNIFICANT EVENTS  11/30-US Guided Thoracentesis for right sided empyema with removal of 100 ml brown purulent fluid 11/30-Pt admitted to [facility] due to need of CT biopsy for right sided chest tube placement with instillation of thrombolytic agents  12/1 CT guided pigtain RT lung pending 12/1 pulmozyme and TPA therapy infused into chest tube 12/2 pulmozyme and TPA therapy infused into chest tube 12/3 pulmozyme and TPA therapy infused into chest tube       SUBJECTIVE:   Patient feels well today, feels ready to go home.  No acute issues    REVIEW OF SYSTEMS:  Positives in BOLD Constitutional: Negative for fever, chills, weight loss, malaise/fatigue and diaphoresis.  HENT: Negative for hearing loss, ear pain, nosebleeds, congestion, sore throat, neck pain, tinnitus and ear discharge.   Eyes: Negative for blurred vision, double vision, photophobia, pain, discharge and redness.  Respiratory: Negative for cough, hemoptysis, sputum production, shortness of breath, wheezing and stridor.   Cardiovascular: Negative for chest pain, palpitations, orthopnea, claudication, leg swelling and PND.  Skin: Negative for itching and rash.  Neurological: Negative for dizziness, tingling, tremors, sensory change, speech change, focal weakness, seizures, loss of consciousness, weakness and headaches.      VITAL SIGNS: Temp:  [98 F (36.7 C)-98.2 F (36.8 C)] 98.2 F (36.8 C) (12/04 2017) Pulse Rate:  [98-101] 98 (12/04 2017) Resp:  [18] 18 (12/04 2017) BP: (116-120)/(73-76) 116/76 (12/04 2017) SpO2:  [96 %-98 %] 96 % (12/04 2017)  PHYSICAL EXAMINATION: General:  Well developed, well nourished Caucasian male  Neuro:  Alert and oriented, follows commands, PERRLA HEENT:  Supple, no JVD Cardiovascular: Sinus tach, s1s2, no M/R/G Lungs:  Diminished right side, clear on left side, even, non labored Abdomen:  +BS x4, soft, non tender, non distended Musculoskeletal:  Normal bulk and tone  Skin:  No rashes or lesions   Recent Labs Lab 06/18/16 0441  NA 138  K 3.7  CL 105  CO2 26  BUN 8  CREATININE 0.55*  GLUCOSE 111*    Recent Labs Lab 06/18/16 0441  HGB 10.8*  HCT 32.2*  WBC 10.4  PLT 398   Dg Chest 2 View  Result Date: 06/21/2016 CLINICAL DATA:  Right empyema with pigtail catheter EXAM: CHEST  2 VIEW COMPARISON:  06/19/2016 chest radiograph. FINDINGS: Stable position of right basilar pleural catheter. Stable cardiomediastinal silhouette with normal heart size. No pneumothorax. Stable small right basilar pleural effusion. No left pleural effusion . No pulmonary edema. Stable mild patchy right lung base opacity. No new consolidative airspace disease. IMPRESSION: 1. Stable small right basilar pleural effusion with stable right pleural catheter position. No pneumothorax. 2. Stable mild patchy right lung base opacity. Electronically Signed   By: Jeffery [name] M.D.   On: 06/21/2016 08:32   Ct Chest Wo Contrast  Result Date: 06/21/2016 CLINICAL DATA:  Empyema.  Drainage catheter placed. EXAM: CT CHEST WITHOUT CONTRAST TECHNIQUE: Multidetector CT imaging of the chest was performed following the standard protocol without IV contrast. COMPARISON:  CT the chest 06/17/2016. FINDINGS: Cardiovascular: The heart size is normal. Extensive coronary artery calcifications are again seen. No significant pericardial effusion is present. Mediastinum/Nodes: No significant mediastinal or axillary adenopathy is present. The thoracic inlet is within normal limits. Lungs/Pleura: There is significant decrease in the right pleural effusion/empyema. Residual right lower lobe airspace disease is associated. No definite central obstructing mass is present. Right upper  lobe is clear. Left lung is unremarkable. Upper Abdomen: A 12 mm nonobstructing stone is again seen at the upper pole of the right kidney. No other focal  lesions are present. The spleen is enlarged. Atherosclerotic changes are present. Musculoskeletal: Degenerative changes in the thoracic spine are stable. No focal lytic or blastic lesions are present. IMPRESSION: 1. Marked decrease in size of right pleural empyema. 2. Similar appearance of associated airspace disease without definite central obstructing mass. 3. No pneumothorax. 4. Atherosclerosis. 5. 12 mm nonobstructing right upper pole kidney stone. Electronically Signed   By: San Morelle M.D.   On: 06/21/2016 16:18    ASSESSMENT / PLAN: Left empyema, doing much better, drainage yesterday decreased to 150 cc yesterday.   P: --Discussed transfer with Dr. Posey Pronto. Can dc home to complete course of amoxicillin. Can follow up outpatient, Dr. Genevive Bi has tentatively planned to place a heimlich valve.   Jeffery [name], M.D.  06/22/2016

## 2016-06-22 NOTE — Discharge Summary (Signed)
[place] [facility] [place] NAME: Jyrin Moriarty    MR#:  [id number]  DATE OF BIRTH:  Jun 14, 1931  DATE OF ADMISSION:  06/17/2016 ADMITTING PHYSICIAN: Wilhelmina Mcardle, MD  DATE OF DISCHARGE: 06/22/16  PRIMARY CARE PHYSICIAN: Albina Billet, MD    ADMISSION DIAGNOSIS:  Pleural effusion, bacterial [J90] Pleural effusion on right [J90]  DISCHARGE DIAGNOSIS:  Complicated Pneumonia  Empyema s/p CT placement on 06/18/16  SECONDARY DIAGNOSIS:   Past Medical History:  Diagnosis Date  . Hyperlipidemia   . Pneumonia     HOSPITAL COURSE:   82 yo male presented to [facility] on 11/30 for a right sided thoracentesis due to a large posterior loculated pleural effusion with the removal of 100 ml brown purulent fluid. However, due to the inability to drain the right sided pleural effusion thoroughly he will need CT biopsy with placement of drainage tube or catheter placement with instillation of thrombolytic agents.   * Large posterior loculated pleural effusion on the right measures 11 x 6 cm compatible with empyema secondary to compliacted pneumonia S/p Right sided thoracentesis on 11/30-- continue thick, copious secretion from right chest tube.  -Supplemental O2 as needed to maintain O2 sats >92% or above S/p CT  with placement of drainage tube  placement with instillation of thrombolytic agents-12/1  and 12/2, and 12/3 -continue Penicillin G---now change to po augmentin (total 2 weeks) -Dr Genevive Bi consulted-plan is to place heimlich valve and pt go home with CT and f/u pulmponary and dr Genevive Bi as out pt -WC grew streptococcus Intermedius  * DVT prophylaxis lovenox  Overall improved \\will  d/c later today [facility] for CT manangement  SIGNIFICANT EVENTS  11/30-US Guided Thoracentesis for right sided empyema with removal of 100 ml brown purulent fluid 11/30-Pt admitted to [facility] due to need of CT biopsy for right sided chest tube placement with instillation of  thrombolytic agents  12/1 CT guided pigtain RT lung pending 12/1 pulmozyme and TPA therapy infused into chest tube 12/2 pulmozyme and TPA therapy infused into chest tube 12/3 pulmozyme and TPA therapy infused into chest tube  12/3-bedside procedure patient with Pigtail Catheter placed via CT scan with PUS draining from chest tube.   D/c home CONSULTS OBTAINED:    DRUG ALLERGIES:  No Known Allergies  DISCHARGE MEDICATIONS:   Current Discharge Medication List    CONTINUE these medications which have CHANGED   Details  amoxicillin-clavulanate (AUGMENTIN) 875-125 MG tablet Take 1 tablet by mouth every 12 (twelve) hours. Qty: 16 tablet, Refills: 0      CONTINUE these medications which have NOT CHANGED   Details  VIRTUSSIN A/C 100-10 MG/5ML syrup 5 mLs 3 (three) times daily as needed.         If you experience worsening of your admission symptoms, develop shortness of breath, life threatening emergency, suicidal or homicidal thoughts you must seek medical attention immediately by calling 911 or calling your MD immediately  if symptoms less severe.  You Must read complete instructions/literature along with all the possible adverse reactions/side effects for all the Medicines you take and that have been prescribed to you. Take any new Medicines after you have completely understood and accept all the possible adverse reactions/side effects.   Please note  You were cared for by a hospitalist during your hospital stay. If you have any questions about your discharge medications or the care you received while you were in the hospital after you are discharged, you can call the  unit and asked to speak with the hospitalist on call if the hospitalist that took care of you is not available. Once you are discharged, your primary care physician will handle any further medical issues. Please note that NO REFILLS for any discharge medications will be authorized once you are discharged, as it is  imperative that you return to your primary care physician (or establish a relationship with a primary care physician if you do not have one) for your aftercare needs so that they can reassess your need for medications and monitor your lab values. Today   SUBJECTIVE   Doing well  VITAL SIGNS:  Blood pressure 116/76, pulse 98, temperature 98.2 F (36.8 C), temperature source Oral, resp. rate 18, height 5\' 10"  (1.778 m), weight 76.9 kg (169 lb 8 oz), SpO2 96 %.  I/O:   Intake/Output Summary (Last 24 hours) at 06/22/16 0942 Last data filed at 06/22/16 0043  Gross per 24 hour  Intake              850 ml  Output              500 ml  Net              350 ml    PHYSICAL EXAMINATION:  GENERAL:  82 y.o.-year-old patient lying in the bed with no acute distress.  EYES: Pupils equal, round, reactive to light and accommodation. No scleral icterus. Extraocular muscles intact.  HEENT: Head atraumatic, normocephalic. Oropharynx and nasopharynx clear.  NECK:  Supple, no jugular venous distention. No thyroid enlargement, no tenderness.  LUNGS: Normal breath sounds bilaterally, no wheezing, rales,rhonchi or crepitation. No use of accessory muscles of respiration. Right CT++ CARDIOVASCULAR: S1, S2 normal. No murmurs, rubs, or gallops.  ABDOMEN: Soft, non-tender, non-distended. Bowel sounds present. No organomegaly or mass.  EXTREMITIES: No pedal edema, cyanosis, or clubbing.  NEUROLOGIC: Cranial nerves II through XII are intact. Muscle strength 5/5 in all extremities. Sensation intact. Gait not checked.  PSYCHIATRIC: The patient is alert and oriented x 3.  SKIN: No obvious rash, lesion, or ulcer.   DATA REVIEW:   CBC   Recent Labs Lab 06/18/16 0441  WBC 10.4  HGB 10.8*  HCT 32.2*  PLT 398    Chemistries   Recent Labs Lab 06/18/16 0441  NA 138  K 3.7  CL 105  CO2 26  GLUCOSE 111*  BUN 8  CREATININE 0.55*  CALCIUM 8.8*  AST 10*  ALT 9*  ALKPHOS 66  BILITOT 0.1*     Microbiology Results   Recent Results (from the past 240 hour(s))  Body fluid culture     Status: Abnormal   Collection Time: 06/17/16  3:10 PM  Result Value Ref Range Status   Specimen Description PLEURAL  Final   Special Requests NONE  Final   Gram Stain   Final    ABUNDANT WBC PRESENT, PREDOMINANTLY PMN ABUNDANT GRAM POSITIVE COCCI IN PAIRS    Culture (A)  Final    MICROAEROPHILIC STREPTOCOCCI Standardized susceptibility testing for this organism is not available. Performed at [facility]    Report Status 06/20/2016 FINAL  Final  Aerobic/Anaerobic Culture (surgical/deep wound)     Status: None (Preliminary result)   Collection Time: 06/18/16  1:00 PM  Result Value Ref Range Status   Specimen Description Pleural R  Final   Special Requests NONE  Final   Gram Stain   Final    MODERATE WBC PRESENT, PREDOMINANTLY PMN MODERATE GRAM POSITIVE  COCCI IN PAIRS AND CHAINS Performed at [facility]    Culture   Final    MODERATE STREPTOCOCCUS INTERMEDIUS NO ANAEROBES ISOLATED; CULTURE IN PROGRESS FOR 5 DAYS    Report Status PENDING  Incomplete   Organism ID, Bacteria STREPTOCOCCUS INTERMEDIUS  Final      Susceptibility   Streptococcus intermedius - MIC*    ERYTHROMYCIN >=8 RESISTANT Resistant     LEVOFLOXACIN 0.5 SENSITIVE Sensitive     VANCOMYCIN 0.5 SENSITIVE Sensitive     * MODERATE STREPTOCOCCUS INTERMEDIUS    RADIOLOGY:  Dg Chest 2 View  Result Date: 06/21/2016 CLINICAL DATA:  Right empyema with pigtail catheter EXAM: CHEST  2 VIEW COMPARISON:  06/19/2016 chest radiograph. FINDINGS: Stable position of right basilar pleural catheter. Stable cardiomediastinal silhouette with normal heart size. No pneumothorax. Stable small right basilar pleural effusion. No left pleural effusion . No pulmonary edema. Stable mild patchy right lung base opacity. No new consolidative airspace disease. IMPRESSION: 1. Stable small right basilar pleural effusion with stable right  pleural catheter position. No pneumothorax. 2. Stable mild patchy right lung base opacity. Electronically Signed   By: Ilona Sorrel M.D.   On: 06/21/2016 08:32   Ct Chest Wo Contrast  Result Date: 06/21/2016 CLINICAL DATA:  Empyema.  Drainage catheter placed. EXAM: CT CHEST WITHOUT CONTRAST TECHNIQUE: Multidetector CT imaging of the chest was performed following the standard protocol without IV contrast. COMPARISON:  CT the chest 06/17/2016. FINDINGS: Cardiovascular: The heart size is normal. Extensive coronary artery calcifications are again seen. No significant pericardial effusion is present. Mediastinum/Nodes: No significant mediastinal or axillary adenopathy is present. The thoracic inlet is within normal limits. Lungs/Pleura: There is significant decrease in the right pleural effusion/empyema. Residual right lower lobe airspace disease is associated. No definite central obstructing mass is present. Right upper lobe is clear. Left lung is unremarkable. Upper Abdomen: A 12 mm nonobstructing stone is again seen at the upper pole of the right kidney. No other focal lesions are present. The spleen is enlarged. Atherosclerotic changes are present. Musculoskeletal: Degenerative changes in the thoracic spine are stable. No focal lytic or blastic lesions are present. IMPRESSION: 1. Marked decrease in size of right pleural empyema. 2. Similar appearance of associated airspace disease without definite central obstructing mass. 3. No pneumothorax. 4. Atherosclerosis. 5. 12 mm nonobstructing right upper pole kidney stone. Electronically Signed   By: San Morelle M.D.   On: 06/21/2016 16:18     Management plans discussed with the patient, family and they are in agreement.  CODE STATUS:  Code Status History    This patient does not have a recorded code status. Please follow your organizational policy for patients in this situation.    Advance Directive Documentation   Flowsheet Row Most Recent Value   Type of Advance Directive  Healthcare Power of Attorney, Living will  Pre-existing out of facility DNR order (yellow form or pink MOST form)  No data  "MOST" Form in Place?  No data      TOTAL TIME TAKING CARE OF THIS PATIENT:40 minutes.    Elad Macphail M.D on 06/22/2016 at 9:42 AM  Between 7am to 6pm - Pager - [phone] After 6pm go to [email or web address] - password [email or web address] [facility]  Office  [phone]  CC: Primary care physician; Albina Billet, MD

## 2016-06-22 NOTE — Progress Notes (Signed)
Nutrition Follow-up  DOCUMENTATION CODES:   Severe malnutrition in context of chronic illness  INTERVENTION:  Continue Premier Protein po at least once daily, each supplement provides 160 kcal and 30 grams of protein.  Encouraged adequate intake of calories and protein through meals, snacks, and beverages. Reminded patient he has increased protein/calorie needs during infection and needs to meet them in order to prevent further weight loss.   NUTRITION DIAGNOSIS:   Malnutrition (Severe) related to chronic illness, social / environmental circumstances as evidenced by severe depletion of body fat, moderate depletions of muscle mass, severe depletion of muscle mass.  Ongoing.   GOAL:   Patient will meet greater than or equal to 90% of their needs  Not met.  MONITOR:   PO intake, Supplement acceptance, Labs, Weight trends, I & O's  REASON FOR ASSESSMENT:   Malnutrition Screening Tool    ASSESSMENT:   82 yo male presented to [facility] on 11/30 for a right sided thoracentesis due to a large posterior loculated pleural effusion with the removal of 100 ml brown purulent fluid.  -On 12/3 patient had Pigtail Catheter placed via CT scan at bedside. TPA 10 mg and Pulmozyme 5 mg were injected into chest cavity after tube was clamped. -Patient now with discharge summary in.  Spoke with patient at bedside. He reports good appetite, Denies N/V or abdominal pain. Patient enjoys the Premier Protein shake, also had an Ensure Original at bedside.   Meal Completion: 80-100% of Regular diet. In the past 24 hours patient has had 1064 kcal (60% minimum estimated kcal needs) and 61 grams protein (61% minimum estimated protein needs).   Medications reviewed and include: Augmentin.  Labs reviewed: No Chem Profile since initial assessment. Preliminary culture showing streptococcus intermedius resistant to erythromycin.   Diet Order:  Diet regular Room service appropriate? Yes; Fluid consistency:  Thin Diet - low sodium heart healthy  Skin:  Reviewed, no issues  Last BM:  06/17/2016  Height:   Ht Readings from Last 1 Encounters:  06/17/16 _0  (1.778 m)    Weight:   Wt Readings from Last 1 Encounters:  06/17/16 169 lb 8 oz (76.9 kg)    Ideal Body Weight:  75.45 kg  BMI:  Body mass index is 24.32 kg/m.  Estimated Nutritional Needs:   Kcal:  1770-2070 (MSJ x 1.2-1.4)  Protein:  100-115 grams (1.3-1.5 grams/kg)  Fluid:  >/= 1.9 L/day (25 ml/kg)  EDUCATION NEEDS:   Education needs addressed  Willey Blade, MS, RD, LDN Pager: [phone] After Hours Pager: [phone]

## 2016-06-22 NOTE — Progress Notes (Signed)
Update: Patient with R pigtail Chest tube, for R empyema, s/p TPA, now with minimal drainage. Spoke with Dr. Genevive Bi and Dr. Ashby Dawes, request chest be removed prior to discharge.   R Chest tube pulled, vasaline gauze applied, bandage also applied. 2 view cxr ordered.  If no PTX then clear for discharge.    Pulmonary/Critical Care time - 30 mins  Vilinda Boehringer, MD [facility] Pager [phone] (please enter 7-digits) On Call Pager - [phone] (please enter 7-digits)

## 2016-06-23 LAB — AEROBIC/ANAEROBIC CULTURE (SURGICAL/DEEP WOUND)

## 2016-06-23 LAB — AEROBIC/ANAEROBIC CULTURE W GRAM STAIN (SURGICAL/DEEP WOUND)

## 2016-06-29 ENCOUNTER — Other Ambulatory Visit: Payer: Self-pay | Admitting: Cardiothoracic Surgery

## 2016-06-29 DIAGNOSIS — J869 Pyothorax without fistula: Secondary | ICD-10-CM

## 2016-07-02 ENCOUNTER — Encounter: Payer: Self-pay | Admitting: Cardiothoracic Surgery

## 2016-07-02 ENCOUNTER — Ambulatory Visit (INDEPENDENT_AMBULATORY_CARE_PROVIDER_SITE_OTHER): Payer: Medicare Other | Admitting: Pulmonary Disease

## 2016-07-02 ENCOUNTER — Ambulatory Visit
Admission: RE | Admit: 2016-07-02 | Discharge: 2016-07-02 | Disposition: A | Discharge status: Home / Self Care | Payer: Medicare Other | Source: Ambulatory Visit | Attending: Cardiothoracic Surgery | Admitting: Cardiothoracic Surgery

## 2016-07-02 ENCOUNTER — Encounter: Payer: Self-pay | Admitting: Pulmonary Disease

## 2016-07-02 ENCOUNTER — Ambulatory Visit (INDEPENDENT_AMBULATORY_CARE_PROVIDER_SITE_OTHER): Payer: Medicare Other | Admitting: Cardiothoracic Surgery

## 2016-07-02 VITALS — BP 122/75 | HR 103 | Temp 97.7°F | Ht 70.0 in | Wt 174.0 lb

## 2016-07-02 VITALS — BP 120/62 | HR 94 | Ht 70.0 in | Wt 172.6 lb

## 2016-07-02 DIAGNOSIS — J869 Pyothorax without fistula: Secondary | ICD-10-CM | POA: Diagnosis present

## 2016-07-02 DIAGNOSIS — R54 Age-related physical debility: Secondary | ICD-10-CM | POA: Diagnosis not present

## 2016-07-02 DIAGNOSIS — Z23 Encounter for immunization: Secondary | ICD-10-CM

## 2016-07-02 NOTE — Patient Instructions (Signed)
Pneumovax today Follow up in 6 months for repeat CXR

## 2016-07-02 NOTE — Progress Notes (Signed)
PULMONARY OFFICE FOLLOW UP  PT SUMMARY: Previously vibrantly healthy 82 yo M - Treated for PNA early 05/2016 - Developed pleural effusion noted on CXR 05/25/16 - Appt Dr Ashby Dawes 06/01/16 in consultation - Thoracentesis 06/03/16 - 150 cc of fluid described as brown/turbid. Pleural fluid with very high WBC, LDH and GS positive for microaerophilic strep.  - Hospitalized 11/30-12/05/17 for pigtail intra-pleural catheter placement with 3 days of tPA and dornase alfa. Excellent result  SUBJ: He is now back to his baseline with resolution of cough, chest discomfort. Denies fever  OBJ: Vitals:   07/02/16 1010  BP: 120/62  Pulse: 94  SpO2: 97%  Weight: 172 lb 9.6 oz (78.3 kg)  Height: 5\' 10"  (1.778 m)   No distress HEENT WNL No JVD noted, no LAN Slightly diminished BS with crackles in R base Reg, no M NABS No LE edema  BMP Latest Ref Rng & Units 06/18/2016 06/03/2016  Glucose 65 - 99 mg/dL 111(H) 104(H)  BUN 6 - 20 mg/dL 8 8  Creatinine 0.61 - 1.24 mg/dL 0.55(L) 0.72  Sodium 135 - 145 mmol/L 138 133(L)  Potassium 3.5 - 5.1 mmol/L 3.7 4.3  Chloride 101 - 111 mmol/L 105 98(L)  CO2 22 - 32 mmol/L 26 28  Calcium 8.9 - 10.3 mg/dL 8.8(L) 9.2   CBC Latest Ref Rng & Units 06/18/2016 06/03/2016  WBC 3.8 - 10.6 K/uL 10.4 13.1(H)  Hemoglobin 13.0 - 18.0 g/dL 10.8(L) 12.7(L)  Hematocrit 40.0 - 52.0 % 32.2(L) 38.2(L)  Platelets 150 - 440 K/uL 398 430   CXR: much improved aeration in RLL. Some residual scarring best noted on lateral film  IMPRESSION: Empyema - resolved Residual RLL scarring  PLAN/REC: We reviewed his CXRs in office. No further therapy for empyema or PNA is indicated Pneumovax has been administered this visit ROV 6 months with repeat CXR to re-establish his new "baseline" CXR  Merton Border, MD PCCM service Mobile [phone] Pager [phone] 07/02/2016

## 2016-07-02 NOTE — Addendum Note (Signed)
Addended by: Maryanna Shape A on: 07/02/2016 01:59 PM   Modules accepted: Orders

## 2016-07-02 NOTE — Progress Notes (Signed)
  Patient ID: Jeffery [name], male   DOB: 05-22-1931, 82 y.o.   MRN: [id number]  HISTORY: Jeffery [name] returns today in follow-up. He was discharged from the hospital about 2 weeks ago. He was hospitalized for percutaneous drainage of a right empyema that was managed with intrapleural thrombolytics. He states that he feels quite well overall. He has a small amount of discomfort on his left paraspinous area but nothing on the right. He also has a small cough on occasion. He denies any fevers or chills. He denies any hemoptysis.   Vitals:   07/02/16 0916  BP: 122/75  Pulse: (!) 103  Temp: 97.7 F (36.5 C)     EXAM:    Resp: Lungs are clear bilaterally.  No respiratory distress, normal effort. Heart:  Regular without murmurs Abd:  Abdomen is soft, non distended and non tender. No masses are palpable.  There is no rebound and no guarding.  Neurological: Alert and oriented to person, place, and time. Coordination normal.  Skin: Skin is warm and dry. No rash noted. No diaphoretic. No erythema. No pallor.  Psychiatric: Normal mood and affect. Normal behavior. Judgment and thought content normal.    ASSESSMENT: He did have a chest x-ray made today which have independently reviewed. There is some scarring and blunting at the right costophrenic angle but otherwise the lungs are clear.   PLAN:   I did not make a return visit form as I believe he is doing quite well after his percutaneous drainage procedure. He knows to contact us if he has any questions about his care. I also asked him to call if he has any fever or chills or right-sided chest pain.    Nestor Lewandowsky, MD

## 2016-11-16 ENCOUNTER — Encounter: Payer: Self-pay | Admitting: *Deleted

## 2016-11-17 ENCOUNTER — Encounter: Payer: Self-pay | Admitting: *Deleted

## 2016-11-24 ENCOUNTER — Encounter: Payer: Self-pay | Admitting: General Surgery

## 2016-11-24 ENCOUNTER — Ambulatory Visit (INDEPENDENT_AMBULATORY_CARE_PROVIDER_SITE_OTHER): Payer: Medicare Other | Admitting: General Surgery

## 2016-11-24 VITALS — BP 124/68 | HR 74 | Resp 14 | Ht 67.0 in | Wt 178.0 lb

## 2016-11-24 DIAGNOSIS — K4090 Unilateral inguinal hernia, without obstruction or gangrene, not specified as recurrent: Secondary | ICD-10-CM | POA: Diagnosis not present

## 2016-11-24 NOTE — Patient Instructions (Signed)

## 2016-11-24 NOTE — Progress Notes (Addendum)
Patient ID: Jeffery [name], male   DOB: 04/26/1931, 83 y.o.   MRN: [id number]  Chief Complaint  Patient presents with  . Hernia    HPI Jeffery [name] is a 83 y.o. male here today for a evaluation of a right inguinal hernia. Patient states he noticed the area about a week ago. He states the area pops in and out. Patient states he is having a lot of pain in that area. He states he has been holding the area while walking and bending over.  Moves his bowels daily.  No urinary difficulties.  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 Now primarily works in his garden.   HPI  Past Medical History:  Diagnosis Date  . Does use hearing aid   . Empyema lung ([place]) 06/03/2016   MODERATE WBC PRESENT, PREDOMINANTLY PMN, STREP INTERMEDIUS  . Hyperlipidemia   . Pneumonia Nov/Dec 2017    Past Surgical History:  Procedure Laterality Date  . APPENDECTOMY  1971  . COLONOSCOPY  2014  . EYE SURGERY     cataract surgery   . HERNIA REPAIR Left 2008  . INGUINAL HERNIA REPAIR Right 12/01/2016   Right inguinal hernia repair with medium Ultra Pro mesh.  . INSERTION OF MESH Right 12/01/2016   Procedure: INSERTION OF MESH;  Surgeon: Robert Bellow, MD;  Location: [facility] ORS;  Service: General;  Laterality: Right;  . TONSILLECTOMY  1944    Family History  Problem Relation Age of Onset  . Heart attack Father   . ALS Sister   . Cancer Sister   . ALS Sister     Social History Social History  Substance Use Topics  . Smoking status: Former Smoker    Packs/day: 1.00    Years: 10.00    Types: Cigarettes  . Smokeless tobacco: Never Used     Comment: quit smoking in 1970  . Alcohol use Yes     Comment: occ 3/week    No Known Allergies  Current Outpatient Prescriptions  Medication Sig Dispense Refill  . aspirin EC 81 MG tablet Take 81 mg by mouth daily.    Marland Kitchen atorvastatin (LIPITOR) 10 MG tablet Take 10 mg by mouth daily. 5mg  tablet in am and 5 mg tablet at  bedside    . B Complex-Biotin-FA (SUPER B-50 PO) Take 1 capsule by mouth every Monday, Wednesday, and Friday.     . brimonidine-timolol (COMBIGAN) 0.2-0.5 % ophthalmic solution Place 1 drop into both eyes every 12 (twelve) hours.    . cholecalciferol (VITAMIN D) 1000 units tablet Take 1,000 Units by mouth daily.    Marland Kitchen glucosamine-chondroitin 500-400 MG tablet Take 1 tablet by mouth every Monday, Wednesday, and Friday.     . Multiple Vitamins-Minerals (SUPER ANTIOXIDANT PO) Take 1 tablet by mouth every Monday, Wednesday, and Friday.     . Omega-3 Fatty Acids (FISH OIL) 1000 MG CAPS Take 1,000 mg by mouth every Monday, Wednesday, and Friday.     . Multiple Vitamins-Minerals (EMERGEN-C IMMUNE PLUS) PACK Take 1 packet by mouth daily.     No current facility-administered medications for this visit.    Facility-Administered Medications Ordered in Other Visits  Medication Dose Route Frequency Provider Last Rate Last Dose  . acetaminophen (TYLENOL) tablet 650 mg  650 mg Oral Q6H PRN Awilda Bill, NP   1,000 mg at 12/01/16 1357  . ondansetron (ZOFRAN) injection 4 mg  4 mg Intravenous Q6H PRN Awilda Bill,  NP        Review of Systems Review of Systems  Constitutional: Negative.   Respiratory: Negative.   Cardiovascular: Negative.     Blood pressure 124/68, pulse 74, resp. rate 14, height 5\' 7"  (1.702 m), weight 178 lb (80.7 kg).  Physical Exam Physical Exam  Constitutional: He is oriented to person, place, and time. He appears well-developed and well-nourished.  Eyes: Conjunctivae are normal. No scleral icterus.  Neck: Neck supple.  Cardiovascular: Normal rate, regular rhythm and normal heart sounds.   Pulmonary/Chest: Effort normal and breath sounds normal.  Abdominal: Soft. Bowel sounds are normal. There is no hepatomegaly. There is no tenderness. A hernia is present. Hernia confirmed positive in the right inguinal area.  Lymphadenopathy:    He has no cervical adenopathy.   Neurological: He is oriented to person, place, and time.  Skin: Skin is warm and dry.    Data Reviewed CBC and comprehensive metabolic panel dated 31/49/7026 obtained during his admission for the empyema was notable for a hemoglobin of 10.8 with an MCV of 81.  White blood cell count 10,000. Creatinine of 0.5. Low albumin of 2.4. Chest x-ray obtained 07/02/2016 post her cutaneous drainage of his chest infection showed mild volume loss on the right with continued interval decrease in size of the empyema. Infection treated with IV penicillin followed by by mouth Augmentin.  Assessment    Small right inguinal hernia.  No residual pulmonary symptoms.    Plan    Options for management reviewed: 1) observation based on small size and anticipated decreasing local discomfort with time versus 2) early operative intervention. The patient reports he is very active and is troubled by this area would like to proceed to early repair.  No present pulmonary symptoms after his episode of a strep empyema last fall.     Hernia precautions and incarceration were discussed with the patient. If they develop symptoms of an incarcerated hernia, they were encouraged to seek prompt medical attention.  I have recommended repair of the hernia using mesh on an outpatient basis in the near future. The risk of infection was reviewed. The role of prosthetic mesh to minimize the risk of recurrence was reviewed.  HPI, Physical Exam, Assessment and Plan have been scribed under the direction and in the presence of Hervey Ard, MD.  Gaspar Cola, CMA  [place], Forest Gleason 12/10/2016, 1:05 PM   Patient's surgery has been scheduled for 12-01-16. It is okay for patient to continue 81 mg aspirin once daily.   Dominga Ferry, CMA

## 2016-11-25 ENCOUNTER — Encounter: Payer: Self-pay | Admitting: General Surgery

## 2016-11-25 DIAGNOSIS — K4090 Unilateral inguinal hernia, without obstruction or gangrene, not specified as recurrent: Secondary | ICD-10-CM | POA: Insufficient documentation

## 2016-11-26 ENCOUNTER — Ambulatory Visit
Admission: RE | Admit: 2016-11-26 | Discharge: 2016-11-26 | Disposition: A | Discharge status: Home / Self Care | Payer: Medicare Other | Source: Ambulatory Visit | Attending: General Surgery | Admitting: General Surgery

## 2016-11-26 ENCOUNTER — Encounter
Admission: RE | Admit: 2016-11-26 | Discharge: 2016-11-26 | Disposition: A | Discharge status: Home / Self Care | Payer: Medicare Other | Source: Ambulatory Visit | Attending: General Surgery | Admitting: General Surgery

## 2016-11-26 DIAGNOSIS — Z01810 Encounter for preprocedural cardiovascular examination: Secondary | ICD-10-CM | POA: Diagnosis not present

## 2016-11-26 DIAGNOSIS — J869 Pyothorax without fistula: Secondary | ICD-10-CM

## 2016-11-26 DIAGNOSIS — Z01818 Encounter for other preprocedural examination: Secondary | ICD-10-CM | POA: Insufficient documentation

## 2016-11-26 DIAGNOSIS — J90 Pleural effusion, not elsewhere classified: Secondary | ICD-10-CM | POA: Diagnosis not present

## 2016-11-26 DIAGNOSIS — Z01812 Encounter for preprocedural laboratory examination: Secondary | ICD-10-CM | POA: Insufficient documentation

## 2016-11-26 DIAGNOSIS — I10 Essential (primary) hypertension: Secondary | ICD-10-CM | POA: Diagnosis not present

## 2016-11-26 LAB — CBC WITH DIFFERENTIAL/PLATELET
Basophils Absolute: 0 10*3/uL (ref 0–0.1)
Basophils Relative: 1 %
EOS PCT: 4 %
Eosinophils Absolute: 0.3 10*3/uL (ref 0–0.7)
HCT: 41.4 % (ref 40.0–52.0)
Hemoglobin: 14.1 g/dL (ref 13.0–18.0)
LYMPHS ABS: 1.4 10*3/uL (ref 1.0–3.6)
LYMPHS PCT: 22 %
MCH: 29.5 pg (ref 26.0–34.0)
MCHC: 34.2 g/dL (ref 32.0–36.0)
MCV: 86.4 fL (ref 80.0–100.0)
MONO ABS: 0.4 10*3/uL (ref 0.2–1.0)
MONOS PCT: 7 %
Neutro Abs: 4.2 10*3/uL (ref 1.4–6.5)
Neutrophils Relative %: 66 %
PLATELETS: 185 10*3/uL (ref 150–440)
RBC: 4.79 MIL/uL (ref 4.40–5.90)
RDW: 14.4 % (ref 11.5–14.5)
WBC: 6.3 10*3/uL (ref 3.8–10.6)

## 2016-11-26 NOTE — Patient Instructions (Signed)
Your procedure is scheduled on: Dec 01, 2016 [facility] Su procedimiento est programado para: Report to [place] AND COME THRU REVOLVING DOOR Presntese a: To find out your arrival time please call [phone] between 1PM - 3PM on Nov 30, 2016 Tuesday  Para saber su hora de llegada por favor llame al ([place]:  Remember: Instructions that are not followed completely may result in serious medical risk, up to and including death, or upon the discretion of your surgeon and anesthesiologist your surgery may need to be rescheduled.  Recuerde: Las instrucciones que no se siguen completamente Heritage manager en un riesgo de salud grave, incluyendo hasta la [place] o a discrecin de su cirujano y Environmental health practitioner, su ciruga se puede posponer.   __X__ 1. Do not eat food or drink liquids after midnight. No gum chewing or hard candies.  No coma alimentos ni tome lquidos despus de la [place]oche.  No mastique chicle ni caramelos  duros.     __X__ 2. No alcohol for 24 hours before or after surgery.    No tome alcohol durante las 24 horas antes ni despus de la [place].   __X__ 3. Bring all medications with you on the day of surgery if instructed. BRING ANY NEW MEDICATION TO HOSPITAL    Lleve todos los medicamentos con usted el da de su ciruga si se le ha indicado as.   _X___ 4. Notify your doctor if there is any change in your medical condition (cold, fever,                             infections).    Informe a su mdico si hay algn cambio en su condicin mdica (resfriado, fiebre, infecciones).   Do not wear jewelry, make-up, hairpins, clips or nail polish.  No use joyas, maquillajes, pinzas/ganchos para el cabello ni esmalte de uas.  Do not wear lotions, powders, or perfumes. You may NOT wear deodorant.  No use lociones, polvos o perfumes.  Puede usar desodorante.    Do not shave 48 hours prior to surgery. Men may shave face and neck.  No  se afeite 48 horas antes de la [place].  Los hombres pueden [employer] cara y el cuello.   Do not bring valuables to the hospital.   No lleve objetos [facility] is not responsible for any belongings or valuables.  [place] no se hace responsable de ningn tipo de pertenencias u objetos de Geographical information systems officer.               Contacts, dentures or bridgework may not be worn into surgery.  Los lentes de [place], las dentaduras postizas o puentes no se pueden usar en la [place].  Leave your suitcase in the car. After surgery it may be brought to your room.  Deje su maleta en el auto.  Despus de la ciruga podr traerla a su habitacin.  For patients admitted to the hospital, discharge time is determined by your treatment team.  Para los pacientes que sean ingresados al hospital, el tiempo en el cual se le dar de alta es determinado por su                equipo de [place].   Patients discharged the day of surgery will not be allowed to drive home. A los pacientes que se les da de alta el mismo  da de la ciruga no se les permitir conducir a Holiday representative.   Please read over the following fact sheets that you were given: Por favor [place] informacin que le dieron:   CHG INSTRUCTIONS   __X__ Take these medicines the morning of surgery with A SIP OF WATER:          [employer] medicinas la maana de la ciruga con UN SORBO DE AGUA:  1.NONE  2.   3.   4.       5.  6.  ____ Fleet Enema (as directed)          Enema de Fleet (segn lo indicado)    ____ Use CHG Soap as directed          Utilice el jabn de CHG segn lo indicado  ____ Use inhalers on the day of surgery          Use los inhaladores el da de la ciruga  ____ Stop metformin 2 days prior to surgery          Deje de tomar el metformin 2 das antes de la ciruga    ____ Take 1/2 of usual insulin dose the night before surgery and none on the morning of surgery           Tome la mitad de la  dosis habitual de insulina la noche antes de la [place] y no tome nada en la maana de la             ciruga  __X__ Stop Coumadin/Plavix/aspirin UNTIL AFTER SURGERY          Deje de tomar el Coumadin/Plavix/aspirina el da:  __X__ Stop Anti-inflammatories on ADVIL, IBUPROFEN, MOTRIN, ALEVE, GOODY;S POWDER,ANAPROX          Deje de tomar antiinflamatorios el da:   __X__ Stop supplements until after surgery  FISH OIL, SUPER OXIDANT, GLUCOSAMINE          Deje de tomar suplementos hasta despus de la ciruga  ____ Bring C-Pap to the hospital          [place] [facility] hospital

## 2016-11-30 MED ORDER — CEFAZOLIN SODIUM-DEXTROSE 2-4 GM/100ML-% IV SOLN
2.0000 g | INTRAVENOUS | Status: AC
  Administered 2016-12-01: 2 g via INTRAVENOUS

## 2016-12-01 ENCOUNTER — Encounter: Payer: Self-pay | Admitting: *Deleted

## 2016-12-01 ENCOUNTER — Ambulatory Visit
Admission: RE | Admit: 2016-12-01 | Discharge: 2016-12-01 | Disposition: A | Discharge status: Home / Self Care | Payer: Medicare Other | Source: Ambulatory Visit | Attending: General Surgery | Admitting: General Surgery

## 2016-12-01 ENCOUNTER — Encounter
Admission: RE | Disposition: A | Discharge status: Home / Self Care | Payer: Self-pay | Source: Ambulatory Visit | Attending: General Surgery

## 2016-12-01 ENCOUNTER — Ambulatory Visit: Payer: Medicare Other | Admitting: Anesthesiology

## 2016-12-01 DIAGNOSIS — E785 Hyperlipidemia, unspecified: Secondary | ICD-10-CM | POA: Insufficient documentation

## 2016-12-01 DIAGNOSIS — K4090 Unilateral inguinal hernia, without obstruction or gangrene, not specified as recurrent: Secondary | ICD-10-CM | POA: Insufficient documentation

## 2016-12-01 HISTORY — PX: INSERTION OF MESH: SHX5868

## 2016-12-01 HISTORY — PX: INGUINAL HERNIA REPAIR: SHX194

## 2016-12-01 SURGERY — REPAIR, HERNIA, INGUINAL, ADULT
Anesthesia: General | Site: Inguinal | Laterality: Right | Wound class: Clean

## 2016-12-01 MED ORDER — HYDROCODONE-ACETAMINOPHEN 5-325 MG PO TABS: 1.0000 | ORAL_TABLET | ORAL | 0 refills | Status: DC | PRN

## 2016-12-01 MED ORDER — BUPIVACAINE-EPINEPHRINE (PF) 0.5% -1:200000 IJ SOLN
INTRAMUSCULAR | Status: DC | PRN
  Administered 2016-12-01: 30 mL

## 2016-12-01 MED ORDER — LIDOCAINE HCL (CARDIAC) 20 MG/ML IV SOLN
INTRAVENOUS | Status: DC | PRN
  Administered 2016-12-01: 80 mg via INTRAVENOUS

## 2016-12-01 MED ORDER — BUPIVACAINE-EPINEPHRINE (PF) 0.5% -1:200000 IJ SOLN
INTRAMUSCULAR | Status: AC
  Filled 2016-12-01: qty 30

## 2016-12-01 MED ORDER — ACETAMINOPHEN 10 MG/ML IV SOLN
INTRAVENOUS | Status: AC
  Filled 2016-12-01: qty 100

## 2016-12-01 MED ORDER — PROPOFOL 10 MG/ML IV BOLUS
INTRAVENOUS | Status: DC | PRN
  Administered 2016-12-01: 150 mg via INTRAVENOUS

## 2016-12-01 MED ORDER — KETOROLAC TROMETHAMINE 30 MG/ML IJ SOLN
INTRAMUSCULAR | Status: AC
  Filled 2016-12-01: qty 1

## 2016-12-01 MED ORDER — FENTANYL CITRATE (PF) 100 MCG/2ML IJ SOLN
INTRAMUSCULAR | Status: AC
  Filled 2016-12-01: qty 2

## 2016-12-01 MED ORDER — OXYCODONE HCL 5 MG/5ML PO SOLN: 5.0000 mg | Freq: Once | ORAL | Status: DC | PRN

## 2016-12-01 MED ORDER — FAMOTIDINE 20 MG PO TABS
20.0000 mg | ORAL_TABLET | Freq: Once | ORAL | Status: AC
  Administered 2016-12-01: 20 mg via ORAL

## 2016-12-01 MED ORDER — OXYCODONE HCL 5 MG PO TABS
5.0000 mg | ORAL_TABLET | Freq: Once | ORAL | Status: DC | PRN
Start: 2016-12-01 — End: 2016-12-01

## 2016-12-01 MED ORDER — PHENYLEPHRINE HCL 10 MG/ML IJ SOLN
INTRAMUSCULAR | Status: AC
  Filled 2016-12-01: qty 1

## 2016-12-01 MED ORDER — ONDANSETRON HCL 4 MG/2ML IJ SOLN
INTRAMUSCULAR | Status: DC | PRN
  Administered 2016-12-01: 4 mg via INTRAVENOUS

## 2016-12-01 MED ORDER — EPHEDRINE SULFATE 50 MG/ML IJ SOLN
INTRAMUSCULAR | Status: DC | PRN
  Administered 2016-12-01: 10 mg via INTRAVENOUS
  Administered 2016-12-01 (×2): 5 mg via INTRAVENOUS

## 2016-12-01 MED ORDER — PROMETHAZINE HCL 25 MG/ML IJ SOLN: 6.2500 mg | INTRAMUSCULAR | Status: DC | PRN

## 2016-12-01 MED ORDER — FAMOTIDINE 20 MG PO TABS
ORAL_TABLET | ORAL | Status: AC
  Filled 2016-12-01: qty 1

## 2016-12-01 MED ORDER — PROPOFOL 10 MG/ML IV BOLUS
INTRAVENOUS | Status: AC
  Filled 2016-12-01: qty 20

## 2016-12-01 MED ORDER — SEVOFLURANE IN SOLN
RESPIRATORY_TRACT | Status: AC
Start: 2016-12-01 — End: 2016-12-01
  Filled 2016-12-01: qty 250

## 2016-12-01 MED ORDER — FENTANYL CITRATE (PF) 100 MCG/2ML IJ SOLN
INTRAMUSCULAR | Status: DC | PRN
  Administered 2016-12-01: 25 ug via INTRAVENOUS

## 2016-12-01 MED ORDER — KETOROLAC TROMETHAMINE 30 MG/ML IJ SOLN
INTRAMUSCULAR | Status: DC | PRN
  Administered 2016-12-01: 30 mg

## 2016-12-01 MED ORDER — MEPERIDINE HCL 50 MG/ML IJ SOLN: 6.2500 mg | INTRAMUSCULAR | Status: DC | PRN

## 2016-12-01 MED ORDER — LIDOCAINE HCL (PF) 2 % IJ SOLN
INTRAMUSCULAR | Status: AC
  Filled 2016-12-01: qty 2

## 2016-12-01 MED ORDER — FENTANYL CITRATE (PF) 100 MCG/2ML IJ SOLN: 25.0000 ug | INTRAMUSCULAR | Status: DC | PRN

## 2016-12-01 MED ORDER — ACETAMINOPHEN 10 MG/ML IV SOLN: INTRAVENOUS | Status: DC | PRN

## 2016-12-01 MED ORDER — DEXAMETHASONE SODIUM PHOSPHATE 10 MG/ML IJ SOLN
INTRAMUSCULAR | Status: DC | PRN
  Administered 2016-12-01: 5 mg via INTRAVENOUS

## 2016-12-01 MED ORDER — CEFAZOLIN SODIUM-DEXTROSE 2-4 GM/100ML-% IV SOLN
INTRAVENOUS | Status: AC
  Filled 2016-12-01: qty 100

## 2016-12-01 MED ORDER — EPHEDRINE SULFATE 50 MG/ML IJ SOLN
INTRAMUSCULAR | Status: AC
  Filled 2016-12-01: qty 1

## 2016-12-01 MED ORDER — LACTATED RINGERS IV SOLN
INTRAVENOUS | Status: DC
  Administered 2016-12-01: 12:00:00 via INTRAVENOUS

## 2016-12-01 SURGICAL SUPPLY — 35 items
BLADE SURG 15 STRL SS SAFETY (BLADE) ×8 IMPLANT
CANISTER SUCT 1200ML W/VALVE (MISCELLANEOUS) ×4 IMPLANT
CHLORAPREP W/TINT 26ML (MISCELLANEOUS) ×4 IMPLANT
CLOSURE WOUND 1/2 X4 (GAUZE/BANDAGES/DRESSINGS) ×1
DECANTER SPIKE VIAL GLASS SM (MISCELLANEOUS) ×4 IMPLANT
DRAIN PENROSE 1/4X12 LTX (DRAIN) ×4 IMPLANT
DRAPE LAPAROTOMY 100X77 ABD (DRAPES) ×4 IMPLANT
DRSG TEGADERM 4X4.75 (GAUZE/BANDAGES/DRESSINGS) ×4 IMPLANT
DRSG TELFA 3X8 NADH (GAUZE/BANDAGES/DRESSINGS) ×4 IMPLANT
DRSG TELFA 4X3 1S NADH ST (GAUZE/BANDAGES/DRESSINGS) ×4 IMPLANT
ELECT REM PT RETURN 9FT ADLT (ELECTROSURGICAL) ×4
ELECTRODE REM PT RTRN 9FT ADLT (ELECTROSURGICAL) ×2 IMPLANT
GLOVE BIO SURGEON STRL SZ7.5 (GLOVE) ×4 IMPLANT
GLOVE INDICATOR 8.0 STRL GRN (GLOVE) ×4 IMPLANT
GOWN STRL REUS W/ TWL LRG LVL3 (GOWN DISPOSABLE) ×4 IMPLANT
GOWN STRL REUS W/TWL LRG LVL3 (GOWN DISPOSABLE) ×4
KIT RM TURNOVER STRD PROC AR (KITS) ×4 IMPLANT
LABEL OR SOLS (LABEL) ×4 IMPLANT
MESH HERNIA 6X12 ULTRAPRO MED (Mesh General) ×2 IMPLANT
MESH HERNIA ULTRAPRO MED (Mesh General) ×2 IMPLANT
NDL SAFETY 22GX1.5 (NEEDLE) ×8 IMPLANT
NEEDLE HYPO 25X1 1.5 SAFETY (NEEDLE) ×4 IMPLANT
PACK BASIN MINOR ARMC (MISCELLANEOUS) ×4 IMPLANT
STRIP CLOSURE SKIN 1/2X4 (GAUZE/BANDAGES/DRESSINGS) ×3 IMPLANT
SUT SURGILON 0 BLK (SUTURE) ×8 IMPLANT
SUT VIC AB 2-0 SH 27 (SUTURE) ×2
SUT VIC AB 2-0 SH 27XBRD (SUTURE) ×2 IMPLANT
SUT VIC AB 3-0 54X BRD REEL (SUTURE) ×2 IMPLANT
SUT VIC AB 3-0 BRD 54 (SUTURE) ×2
SUT VIC AB 3-0 SH 27 (SUTURE) ×2
SUT VIC AB 3-0 SH 27X BRD (SUTURE) ×2 IMPLANT
SUT VIC AB 4-0 FS2 27 (SUTURE) ×4 IMPLANT
SWABSTK COMLB BENZOIN TINCTURE (MISCELLANEOUS) ×4 IMPLANT
SYR 3ML LL SCALE MARK (SYRINGE) ×4 IMPLANT
SYR CONTROL 10ML (SYRINGE) ×8 IMPLANT

## 2016-12-01 NOTE — Anesthesia Procedure Notes (Signed)
Procedure Name: LMA Insertion Date/Time: 12/01/2016 1:08 PM Performed by: Hedda Slade Pre-anesthesia Checklist: Patient identified, Patient being monitored, Timeout performed, Emergency Drugs available and Suction available Patient Re-evaluated:Patient Re-evaluated prior to inductionOxygen Delivery Method: Circle system utilized Preoxygenation: Pre-oxygenation with 100% oxygen Intubation Type: IV induction Ventilation: Mask ventilation without difficulty LMA: LMA inserted LMA Size: 4.0 Tube type: Oral Number of attempts: 1 Placement Confirmation: positive ETCO2 and breath sounds checked- equal and bilateral Tube secured with: Tape Dental Injury: Teeth and Oropharynx as per pre-operative assessment

## 2016-12-01 NOTE — OR Nursing (Signed)
Clipper prep preop right pub area; reddened area noted in groin area by L Crowell, CNA, and that area was not shaved; just right pub area;  Dr. Bary Castilla aware of same when in to see patient and advises not to shave that area.

## 2016-12-01 NOTE — Anesthesia Postprocedure Evaluation (Signed)
Anesthesia Post Note  Patient: KOBEE MEDLEN  Procedure(s) Performed: Procedure(s) (LRB): HERNIA REPAIR INGUINAL ADULT (Right) INSERTION OF MESH (Right)  Patient location during evaluation: PACU Anesthesia Type: General Level of consciousness: awake and alert and oriented Pain management: pain level controlled Vital Signs Assessment: post-procedure vital signs reviewed and stable Respiratory status: spontaneous breathing, nonlabored ventilation and respiratory function stable Cardiovascular status: blood pressure returned to baseline and stable Postop Assessment: no signs of nausea or vomiting Anesthetic complications: no     Last Vitals:  Vitals:   12/01/16 1444 12/01/16 1452  BP: 126/65 122/68  Pulse: 64 64  Resp: 17 18  Temp: 36.3 C 36.4 C    Last Pain:  Vitals:   12/01/16 1452  TempSrc: Temporal  PainSc: 0-No pain                 Sawyer Mentzer

## 2016-12-01 NOTE — Discharge Instructions (Signed)
AMBULATORY SURGERY  °DISCHARGE INSTRUCTIONS ° ° °1) The drugs that you were given will stay in your system until tomorrow so for the next 24 hours you should not: ° °A) Drive an automobile °B) Make any legal decisions °C) Drink any alcoholic beverage ° ° °2) You may resume regular meals tomorrow.  Today it is better to start with liquids and gradually work up to solid foods. ° °You may eat anything you prefer, but it is better to start with liquids, then soup and crackers, and gradually work up to solid foods. ° ° °3) Please notify your doctor immediately if you have any unusual bleeding, trouble breathing, redness and pain at the surgery site, drainage, fever, or pain not relieved by medication. ° °Please contact your physician with any problems or Same Day Surgery at [phone], Monday through Friday 6 am to 4 pm, or [place] at [facility] number at [phone]. °

## 2016-12-01 NOTE — Op Note (Signed)
Preoperative diagnosis: Right inguinal hernia.  Postoperative diagnosis: Same.  Operative procedure: Repair of right indirect inguinal hernia with medium Ultra Pro mesh.  Operating surgeon: Ollen Bowl, M.D.  Anesthesia: Gen. by LMA, Marcaine 0.5% with 1-200,000 of epinephrine, Toradol 30 mg.  Estimated blood loss: Less than 2 mL.  Clinical note: This 83 year old male has developed a symptomatic right inguinal hernia. He was better for elective repair. SCD stockings for DVT prevention. Preoperatively for Antibiotic Prophylaxis. Hair Was Removed from the Surgical Site with Clippers Prior to the Procedure.  Operative Note: With the Patient under Adequate General Anesthesia the Area Was Prepped with ChloraPrep and Draped. Field Block Anesthesia Was Established. A 5 Cm Skin Line Incision along the Anticipated Course of the Inguinal Canal Was Carried Aphthous Consulting This Tissue with Hemostasis Achieved by Electrocautery. The External Oblique Was Opened in Direction of Its Fibers. The Ilioinguinal Iliohypogastric Nerves Were Identified and Protected. The Cord Was Dissected and a Medium Sized Indirect Defect Noted. This Was Dissected Back Lovely Internal Inguinal Ring. The Preperitoneal Space Was Cleared and a Medium Ultra Pro Mesh Was Smoothed into Position. The External Component Was Laid along the Floor the Inguinal Canal. The Lateral Aspect Was Closed with Interrupted 0 Surgilon Sutures. It Was Anchored to the Pubic Tubercle with 0 Surgilon. The Inferior Aspect Was Anchored to the Inguinal Ligament with Interrupted 0 Surgilon Sutures. The Medial and Superior Aspects Were Anchored to the  Internal Oblique Aponeurosis. Toradol was placed in the wound. The external oblique was closed with a running 2-0 Vicryl suture. Scarpa's fascia was closed with a running 3-0 Vicryl suture. Skin incision was closed with a running 4-0 Vicryl subcuticular suture. Benzoin, Steri-Strips, Telfa and Tegaderm dressing  was applied.  The patient tolerated the procedure well and was taken the recovery room in stable condition.

## 2016-12-01 NOTE — Anesthesia Post-op Follow-up Note (Cosign Needed)
Anesthesia QCDR form completed.        

## 2016-12-01 NOTE — H&P (Signed)
Healthy 83 y/o male for right inguinal hernia repair. No change in clinical exam or history.

## 2016-12-01 NOTE — Anesthesia Preprocedure Evaluation (Signed)
Anesthesia Evaluation  Patient identified by MRN, date of birth, ID band Patient awake    Reviewed: Allergy & Precautions, NPO status , Patient's Chart, lab work & pertinent test results  History of Anesthesia Complications Negative for: history of anesthetic complications  Airway Mallampati: II  TM Distance: >3 FB Neck ROM: Full    Dental no notable dental hx.    Pulmonary neg sleep apnea, Pneumonia: had pneumonia with empyema in the fall which has since completely resolved., neg COPD, former smoker,    breath sounds clear to auscultation- rhonchi (-) wheezing      Cardiovascular Exercise Tolerance: Good (-) hypertension(-) CAD and (-) Past MI  Rhythm:Regular Rate:Normal - Systolic murmurs and - Diastolic murmurs    Neuro/Psych negative neurological ROS  negative psych ROS   GI/Hepatic negative GI ROS, Neg liver ROS,   Endo/Other  negative endocrine ROSneg diabetes  Renal/GU negative Renal ROS     Musculoskeletal negative musculoskeletal ROS (+)   Abdominal (+) - obese,   Peds  Hematology negative hematology ROS (+)   Anesthesia Other Findings Past Medical History: No date: Does use hearing aid 06/03/2016: Empyema lung ([place])     Comment: MODERATE WBC PRESENT, PREDOMINANTLY PMN, STREP              INTERMEDIUS No date: Hyperlipidemia Nov/Dec 2017: Pneumonia   Reproductive/Obstetrics                             Anesthesia Physical Anesthesia Plan  ASA: II  Anesthesia Plan: General   Post-op Pain Management:    Induction: Intravenous  Airway Management Planned: LMA  Additional Equipment:   Intra-op Plan:   Post-operative Plan:   Informed Consent: I have reviewed the patients History and Physical, chart, labs and discussed the procedure including the risks, benefits and alternatives for the proposed anesthesia with the patient or authorized representative who has indicated  his/her understanding and acceptance.   Dental advisory given  Plan Discussed with: CRNA and Anesthesiologist  Anesthesia Plan Comments:         Anesthesia Quick Evaluation

## 2016-12-01 NOTE — Transfer of Care (Signed)
Immediate Anesthesia Transfer of Care Note  Patient: Jeffery [name]  Procedure(s) Performed: Procedure(s): HERNIA REPAIR INGUINAL ADULT (Right) INSERTION OF MESH (Right)  Patient Location: PACU  Anesthesia Type:General  Level of Consciousness: awake, alert  and oriented  Airway & Oxygen Therapy: Patient Spontanous Breathing and Patient connected to face mask oxygen  Post-op Assessment: Report given to RN and Post -op Vital signs reviewed and stable  Post vital signs: Reviewed and stable  Last Vitals:  Vitals:   12/01/16 1057 12/01/16 1414  BP: 129/75 (!) 103/56  Pulse: 65 62  Resp: 16 10  Temp: 36.5 C 36.4 C    Last Pain:  Vitals:   12/01/16 1414  TempSrc:   PainSc: Asleep         Complications: No apparent anesthesia complications

## 2016-12-02 ENCOUNTER — Encounter: Payer: Self-pay | Admitting: General Surgery

## 2016-12-08 ENCOUNTER — Ambulatory Visit (INDEPENDENT_AMBULATORY_CARE_PROVIDER_SITE_OTHER): Payer: Medicare Other | Admitting: General Surgery

## 2016-12-08 ENCOUNTER — Encounter: Payer: Self-pay | Admitting: General Surgery

## 2016-12-08 VITALS — BP 110/68 | HR 51 | Resp 14 | Ht 67.0 in | Wt 178.0 lb

## 2016-12-08 DIAGNOSIS — K4090 Unilateral inguinal hernia, without obstruction or gangrene, not specified as recurrent: Secondary | ICD-10-CM

## 2016-12-08 NOTE — Progress Notes (Signed)
Patient ID: Jeffery [name], male   DOB: 1931/03/28, 83 y.o.   MRN: [id number]  Chief Complaint  Patient presents with  . Routine Post Op    inguinal hernia    HPI Jeffery [name] is a 83 y.o. male here today for his post op right inguinal hernia repair done on 12/01/2016. Patient states he is doing well. Required scant narcotics after surgery. Moving his bowels daily.  HPI  Past Medical History:  Diagnosis Date  . Does use hearing aid   . Empyema lung ([place]) 06/03/2016   MODERATE WBC PRESENT, PREDOMINANTLY PMN, STREP INTERMEDIUS  . Hyperlipidemia   . Pneumonia Nov/Dec 2017    Past Surgical History:  Procedure Laterality Date  . APPENDECTOMY  1971  . COLONOSCOPY  2014  . EYE SURGERY     cataract surgery   . HERNIA REPAIR Left 2008  . INGUINAL HERNIA REPAIR Right 12/01/2016   Procedure: HERNIA REPAIR INGUINAL ADULT;  Surgeon: Robert Bellow, MD;  Location: [facility] ORS;  Service: General;  Laterality: Right;  . INSERTION OF MESH Right 12/01/2016   Procedure: INSERTION OF MESH;  Surgeon: Robert Bellow, MD;  Location: [facility] ORS;  Service: General;  Laterality: Right;  . TONSILLECTOMY  1944    Family History  Problem Relation Age of Onset  . Heart attack Father   . ALS Sister   . Cancer Sister   . ALS Sister     Social History Social History  Substance Use Topics  . Smoking status: Former Smoker    Packs/day: 1.00    Years: 10.00    Types: Cigarettes  . Smokeless tobacco: Never Used     Comment: quit smoking in 1970  . Alcohol use Yes     Comment: occ 3/week    No Known Allergies  Current Outpatient Prescriptions  Medication Sig Dispense Refill  . aspirin EC 81 MG tablet Take 81 mg by mouth daily.    Marland Kitchen atorvastatin (LIPITOR) 10 MG tablet Take 10 mg by mouth daily. 5mg  tablet in am and 5 mg tablet at bedside    . B Complex-Biotin-FA (SUPER B-50 PO) Take 1 capsule by mouth every Monday, Wednesday, and Friday.     . brimonidine-timolol (COMBIGAN) 0.2-0.5 %  ophthalmic solution Place 1 drop into both eyes every 12 (twelve) hours.    . cholecalciferol (VITAMIN D) 1000 units tablet Take 1,000 Units by mouth daily.    Marland Kitchen glucosamine-chondroitin 500-400 MG tablet Take 1 tablet by mouth every Monday, Wednesday, and Friday.     . Multiple Vitamins-Minerals (EMERGEN-C IMMUNE PLUS) PACK Take 1 packet by mouth daily.    . Multiple Vitamins-Minerals (SUPER ANTIOXIDANT PO) Take 1 tablet by mouth every Monday, Wednesday, and Friday.     . Omega-3 Fatty Acids (FISH OIL) 1000 MG CAPS Take 1,000 mg by mouth every Monday, Wednesday, and Friday.      No current facility-administered medications for this visit.    Facility-Administered Medications Ordered in Other Visits  Medication Dose Route Frequency Provider Last Rate Last Dose  . acetaminophen (TYLENOL) tablet 650 mg  650 mg Oral Q6H PRN Awilda Bill, NP   1,000 mg at 12/01/16 1357  . ondansetron (ZOFRAN) injection 4 mg  4 mg Intravenous Q6H PRN Awilda Bill, NP        Review of Systems Review of Systems  Constitutional: Negative.   Respiratory: Negative.   Cardiovascular: Negative.     Blood pressure 110/68, pulse (!) 51, resp. rate  14, height 5\' 7"  (1.702 m), weight 178 lb (80.7 kg).  Physical Exam Physical Exam  Constitutional: He is oriented to person, place, and time. He appears well-developed and well-nourished.  Abdominal: No hernia.  Minimally bruised, incision healing well.   Genitourinary:     Neurological: He is alert and oriented to person, place, and time.  Skin: Skin is warm and dry.     Assessment    Doing well post right inguinal hernia repair.    Plan        The patient is aware to use a heating pad as needed for comfort. Patient may resume activities as tolerated, taking care to use proper technique when lifting (demonstrated).   Follow up as needed.  HPI, Physical Exam, Assessment and Plan have been scribed under the direction and in the presence of  Hervey Ard, MD.  Gaspar Cola, CMA  I have completed the exam and reviewed the above documentation for accuracy and completeness.  I agree with the above.  Haematologist has been used and any errors in dictation or transcription are unintentional.  Hervey Ard, M.D., F.A.C.S.  Robert Bellow 12/08/2016, 7:09 PM

## 2016-12-08 NOTE — Patient Instructions (Addendum)
The patient is aware to use a heating pad as needed for comfort.  Follow upas needed.

## 2017-01-12 IMAGING — CT CT CHEST W/O CM
2 of 4 series · 15 of 36 positions shown, 18 images · non-contrast
Comparison: CT the chest 06/17/2016.

CLINICAL DATA: Empyema.  Drainage catheter placed.

EXAM:
CT CHEST WITHOUT CONTRAST
TECHNIQUE: Multidetector CT imaging of the chest was performed following the
standard protocol without IV contrast.

[Series 2: thorax · axial · 0.82mm/px · z∈[-696,-414]mm · 12 of 167 slices shown, 15 images]
[im 13/167  mediastinal]
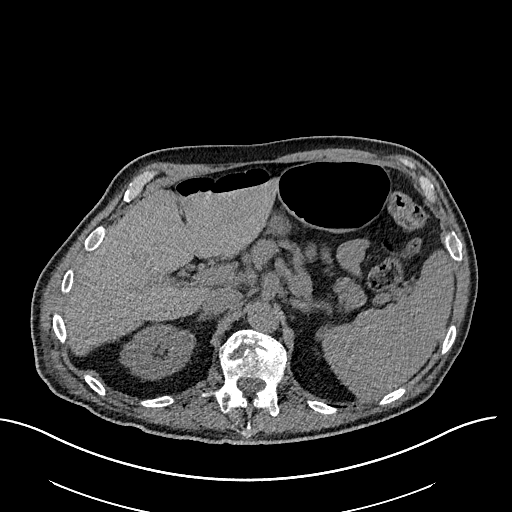
[im 13/167  lung]
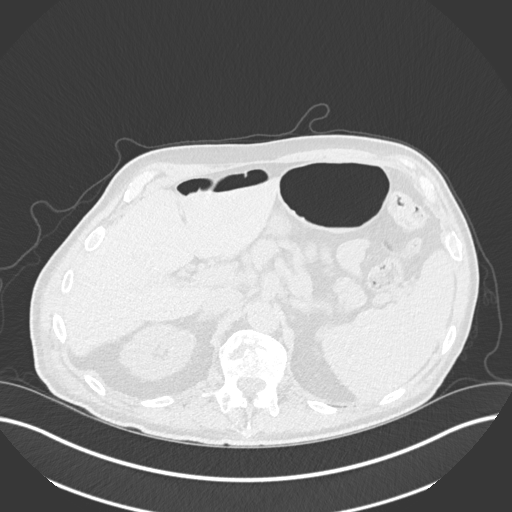
[im 26/167  lung]
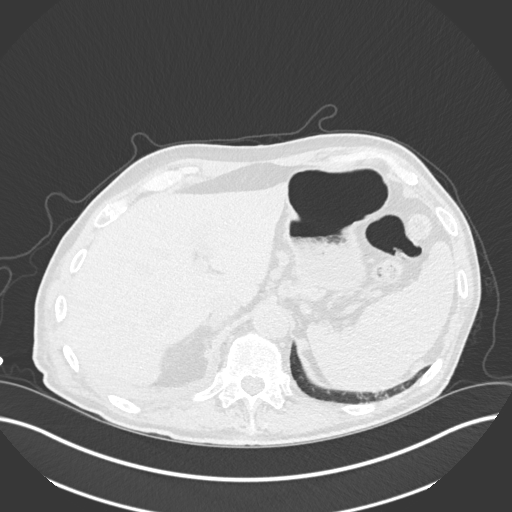
[im 39/167  lung]
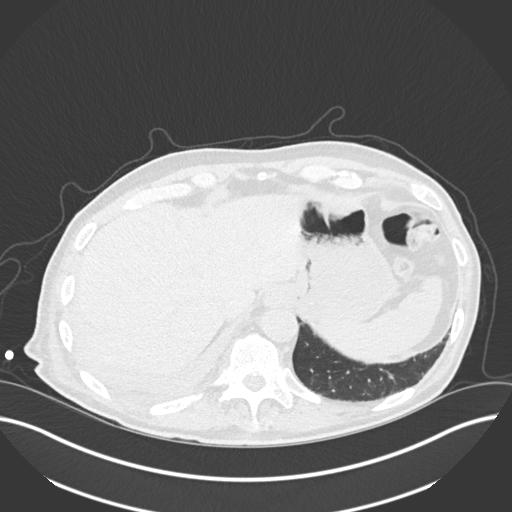
[im 52/167  lung]
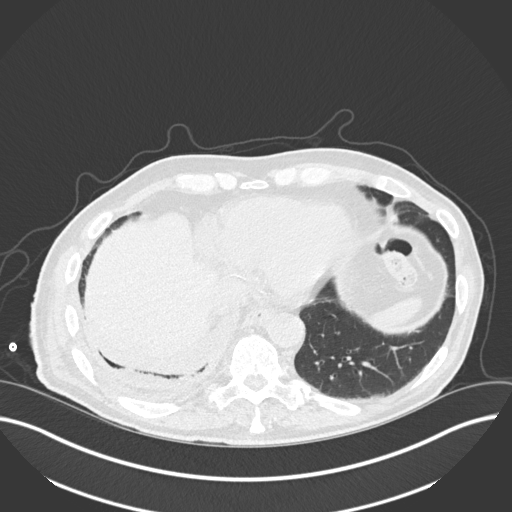
[im 64/167  mediastinal]
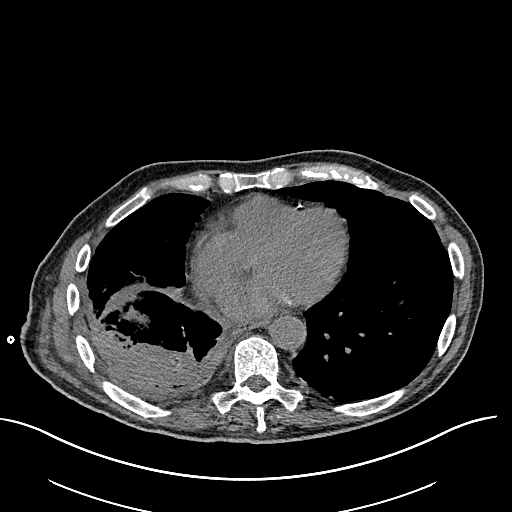
[im 64/167  lung]
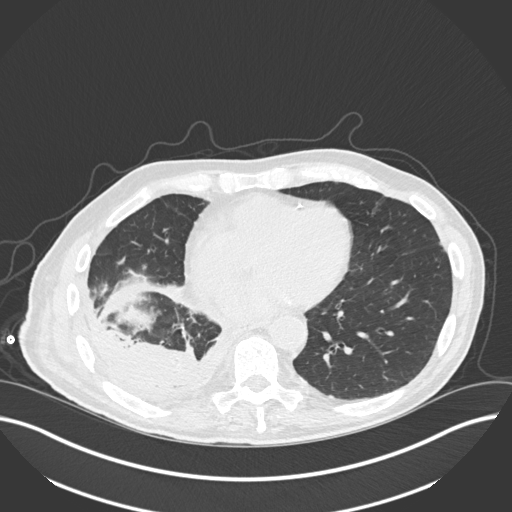
[im 77/167  lung]
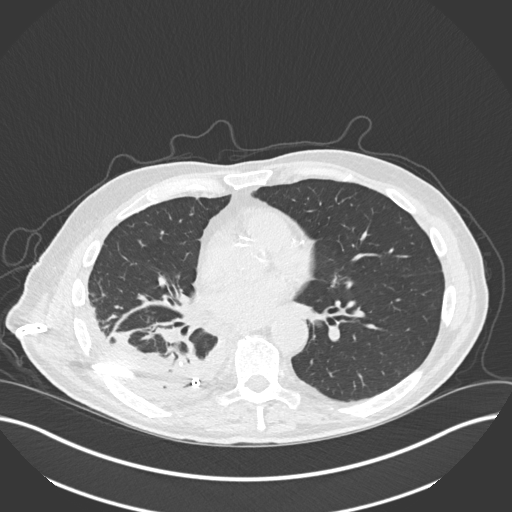
[im 90/167  lung]
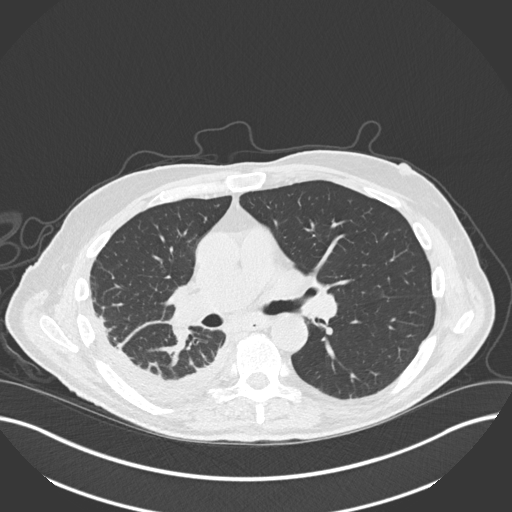
[im 103/167  lung]
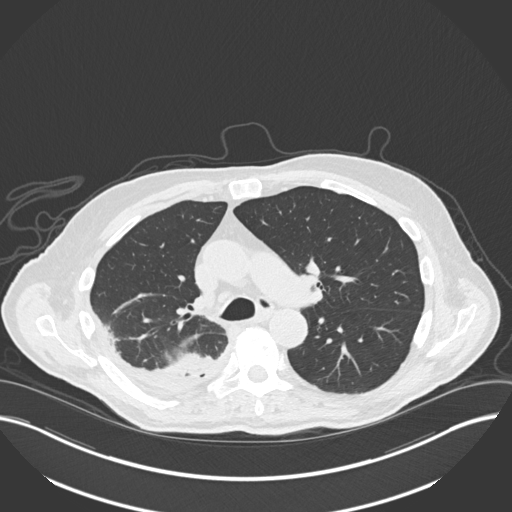
[im 115/167  mediastinal]
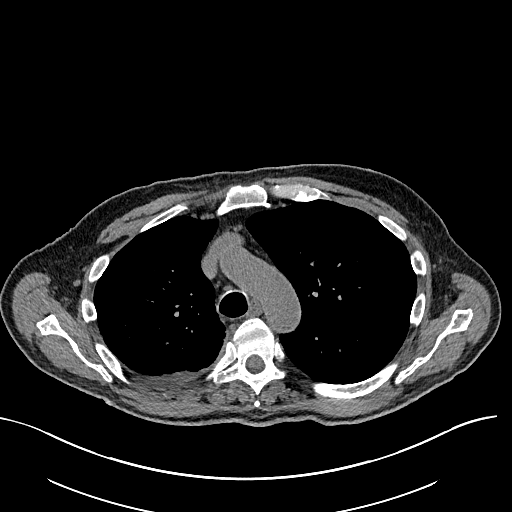
[im 115/167  lung]
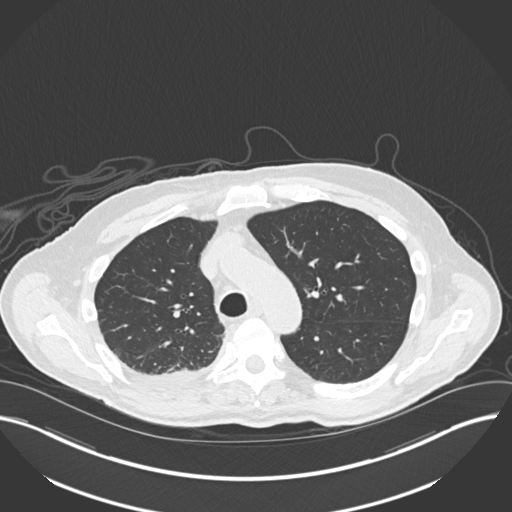
[im 128/167  lung]
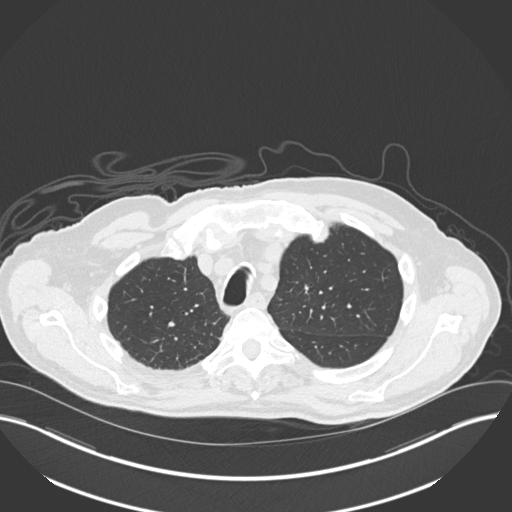
[im 141/167  lung]
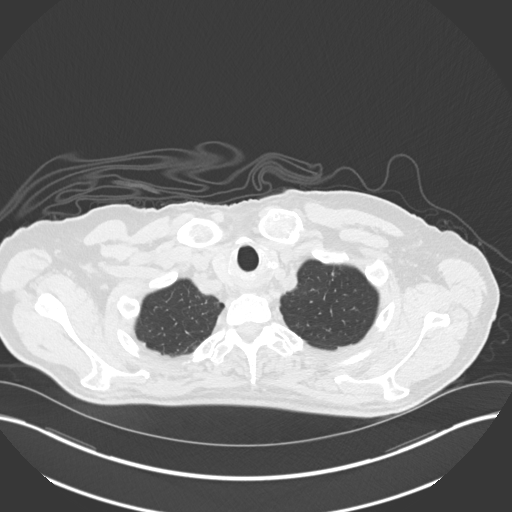
[im 154/167  lung]
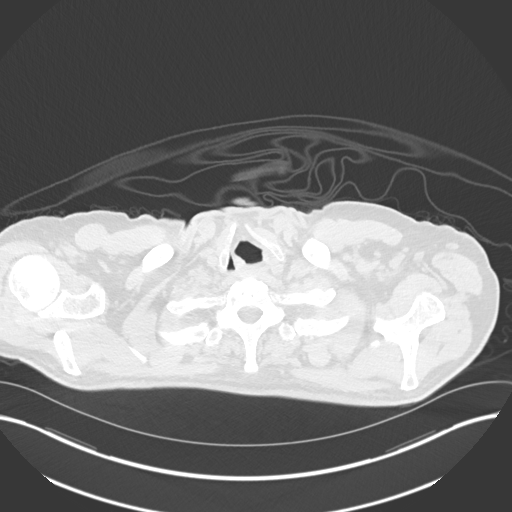

[Series 5: coronal · coronal · 0.65mm/px · 3 of 118 slices shown]
[im 24/118  lung]
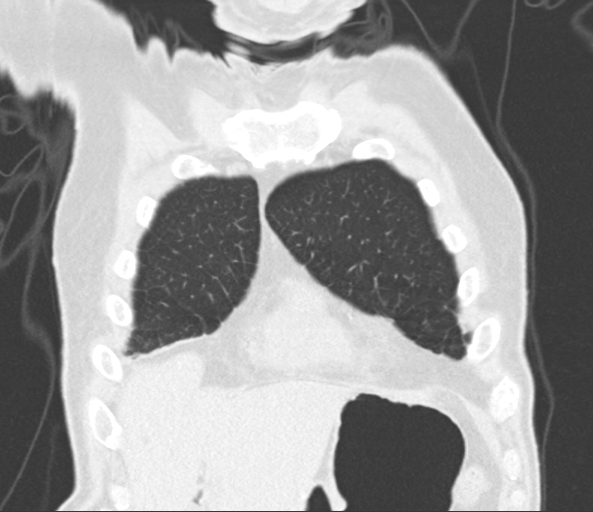
[im 47/118  lung]
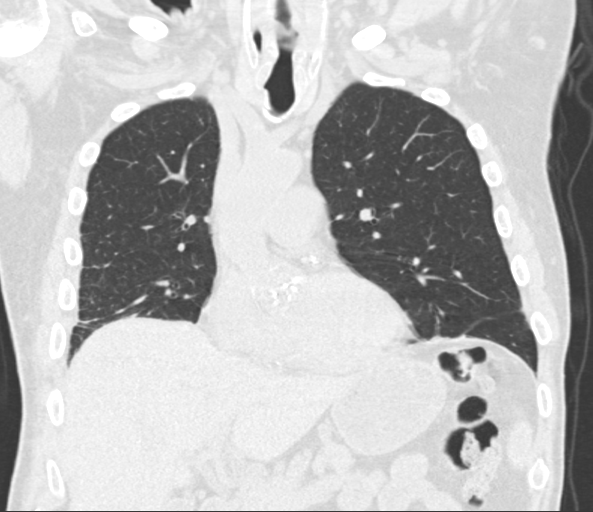
[im 71/118  lung]
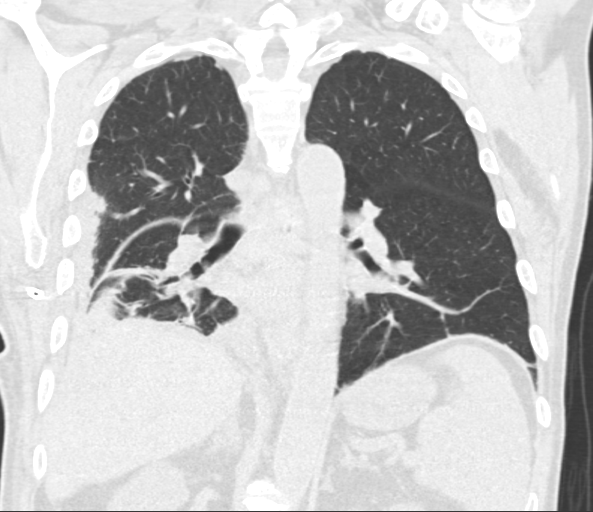

[15 of 36 positions shown; findings below may reference images not displayed]

FINDINGS: Cardiovascular: The heart size is normal. Extensive coronary artery
calcifications are again seen. No significant pericardial effusion
is present.

Mediastinum/Nodes: No significant mediastinal or axillary adenopathy
is present. The thoracic inlet is within normal limits.

Lungs/Pleura: There is significant decrease in the right pleural
effusion/empyema. Residual right lower lobe airspace disease is
associated. No definite central obstructing mass is present. Right
upper lobe is clear. Left lung is unremarkable.

Upper Abdomen: A 12 mm nonobstructing stone is again seen at the
upper pole of the right kidney. No other focal lesions are present.
The spleen is enlarged. Atherosclerotic changes are present.

Musculoskeletal: Degenerative changes in the thoracic spine are
stable. No focal lytic or blastic lesions are present.
IMPRESSION: 1. Marked decrease in size of right pleural empyema.
2. Similar appearance of associated airspace disease without
definite central obstructing mass.
3. No pneumothorax.
4. Atherosclerosis.
5. 12 mm nonobstructing right upper pole kidney stone.

## 2017-01-13 ENCOUNTER — Encounter: Payer: Self-pay | Admitting: *Deleted

## 2017-01-13 IMAGING — CR DG CHEST 2V
1 series · 2 of 2 positions shown · non-contrast
Comparison: 06/21/2016 at 810 hours

CLINICAL DATA: Chest tube removal after empyema.

EXAM:
CHEST  2 VIEW

[Series 2: w chest pa · 0.14mm/px · 2 of 2 slices shown]
[im 1/2]
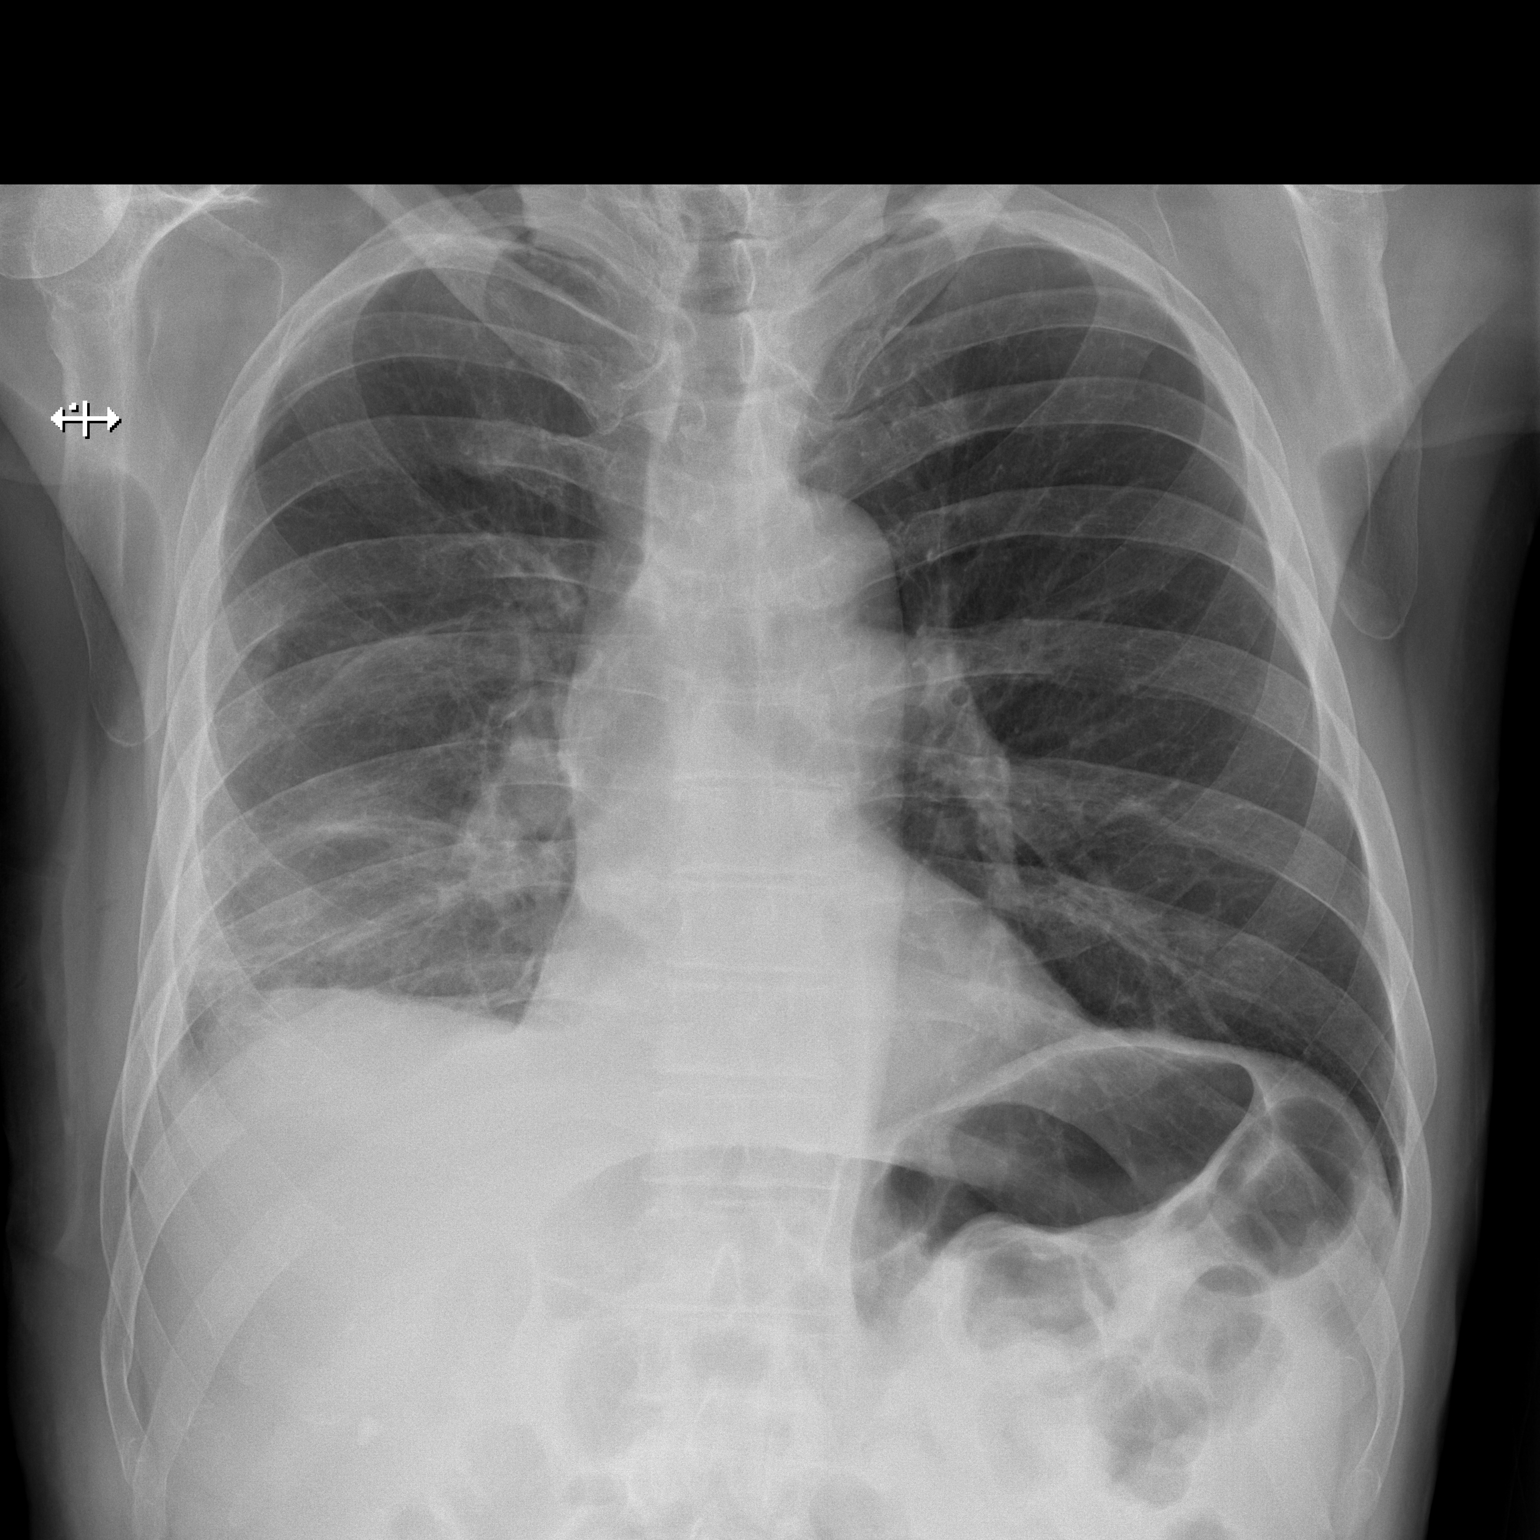
[im 2/2]
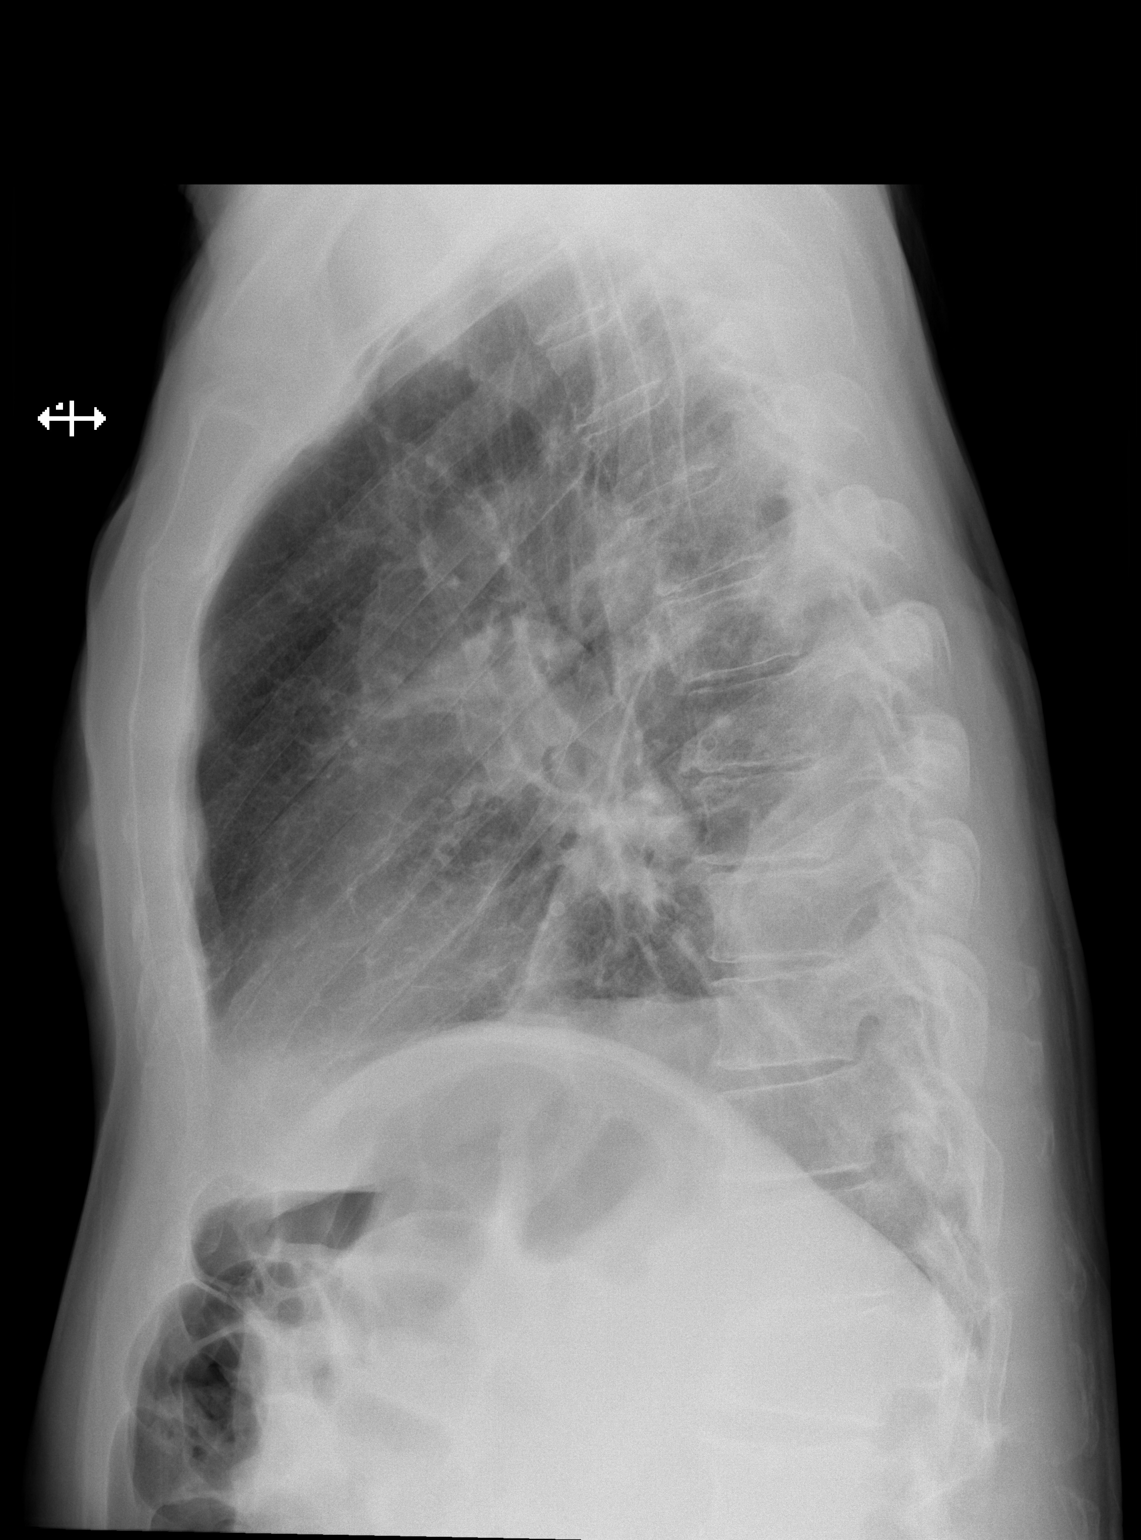

[2 of 2 positions shown; findings below may reference images not displayed]

FINDINGS: There is streaky parenchymal atelectasis and/or scarring at the
right lung base. Right-sided chest tube has been removed. No
pneumothorax. Stable trace pleural thickening or fluid measuring up
to 6 mm in thickness along the periphery of the right lower
hemithorax.
IMPRESSION: No pneumothorax after chest tube removal. Right basilar atelectasis
and/or scarring. Minimal pleural thickening or fluid along the
periphery of the right hemithorax measuring up to 6 mm in thickness.

## 2017-01-14 ENCOUNTER — Encounter: Payer: Self-pay | Admitting: Pulmonary Disease

## 2017-01-14 ENCOUNTER — Ambulatory Visit (INDEPENDENT_AMBULATORY_CARE_PROVIDER_SITE_OTHER): Payer: Medicare Other | Admitting: Pulmonary Disease

## 2017-01-14 VITALS — BP 108/72 | HR 78 | Resp 16 | Ht 67.0 in | Wt 180.0 lb

## 2017-01-14 DIAGNOSIS — R918 Other nonspecific abnormal finding of lung field: Secondary | ICD-10-CM

## 2017-01-14 DIAGNOSIS — J869 Pyothorax without fistula: Secondary | ICD-10-CM | POA: Diagnosis not present

## 2017-01-16 NOTE — Progress Notes (Signed)
PULMONARY OFFICE FOLLOW UP  PT SUMMARY: Previously vibrantly healthy 83 yo M treated for PNA early 05/2016, developed pleural effusion noted on CXR 05/25/16. Appt Dr Ashby Dawes 06/01/16 in consultation. Thoracentesis 06/03/16 - 150 cc of fluid described as brown/turbid. Pleural fluid with very high WBC, LDH and GS positive for microaerophilic strep. Hospitalized 11/30-12/05/17 for pigtail intra-pleural catheter placement with 3 days of tPA and dornase alfa. Excellent result  SUBJ: He is now back to his baseline without fever, cough, chest discomfort.   OBJ: Vitals:   01/14/17 1035  BP: 108/72  Pulse: 78  Resp: 16  SpO2: 99%  Weight: 180 lb (81.6 kg)  Height: 5\' 7"  (1.702 m)   No distress HEENT WNL No JVD noted, no LAN BS full without adventitious sounds Reg, no M NABS No LE edema  BMP Latest Ref Rng & Units 06/18/2016 06/03/2016  Glucose 65 - 99 mg/dL 111(H) 104(H)  BUN 6 - 20 mg/dL 8 8  Creatinine 0.61 - 1.24 mg/dL 0.55(L) 0.72  Sodium 135 - 145 mmol/L 138 133(L)  Potassium 3.5 - 5.1 mmol/L 3.7 4.3  Chloride 101 - 111 mmol/L 105 98(L)  CO2 22 - 32 mmol/L 26 28  Calcium 8.9 - 10.3 mg/dL 8.8(L) 9.2   CBC Latest Ref Rng & Units 11/26/2016 06/18/2016 06/03/2016  WBC 3.8 - 10.6 K/uL 6.3 10.4 13.1(H)  Hemoglobin 13.0 - 18.0 g/dL 14.1 10.8(L) 12.7(L)  Hematocrit 40.0 - 52.0 % 41.4 32.2(L) 38.2(L)  Platelets 150 - 440 K/uL 185 398 430   CXR (11/26/16): nearly resolved RLL opacity. Some residual blunting of R CP angle  IMPRESSION: Empyema - resolved Minimal residual RLL scarring  PLAN/REC: We reviewed his CXRs in office. No further therapy for empyema or PNA is indicated. No further pulmonary evaluation or treatment is required. F/U PRN   Merton Border, MD PCCM service Mobile [phone] Pager [phone] 01/16/2017

## 2017-01-23 IMAGING — CR DG CHEST 2V
1 series · 2 of 2 positions shown · non-contrast
Comparison: Chest x-ray of June 22, 2016

CLINICAL DATA: Follow-up of right-sided empyema.

EXAM:
CHEST  2 VIEW

[Series 4: w chest pa · 0.14mm/px · 2 of 2 slices shown]
[im 1/2]
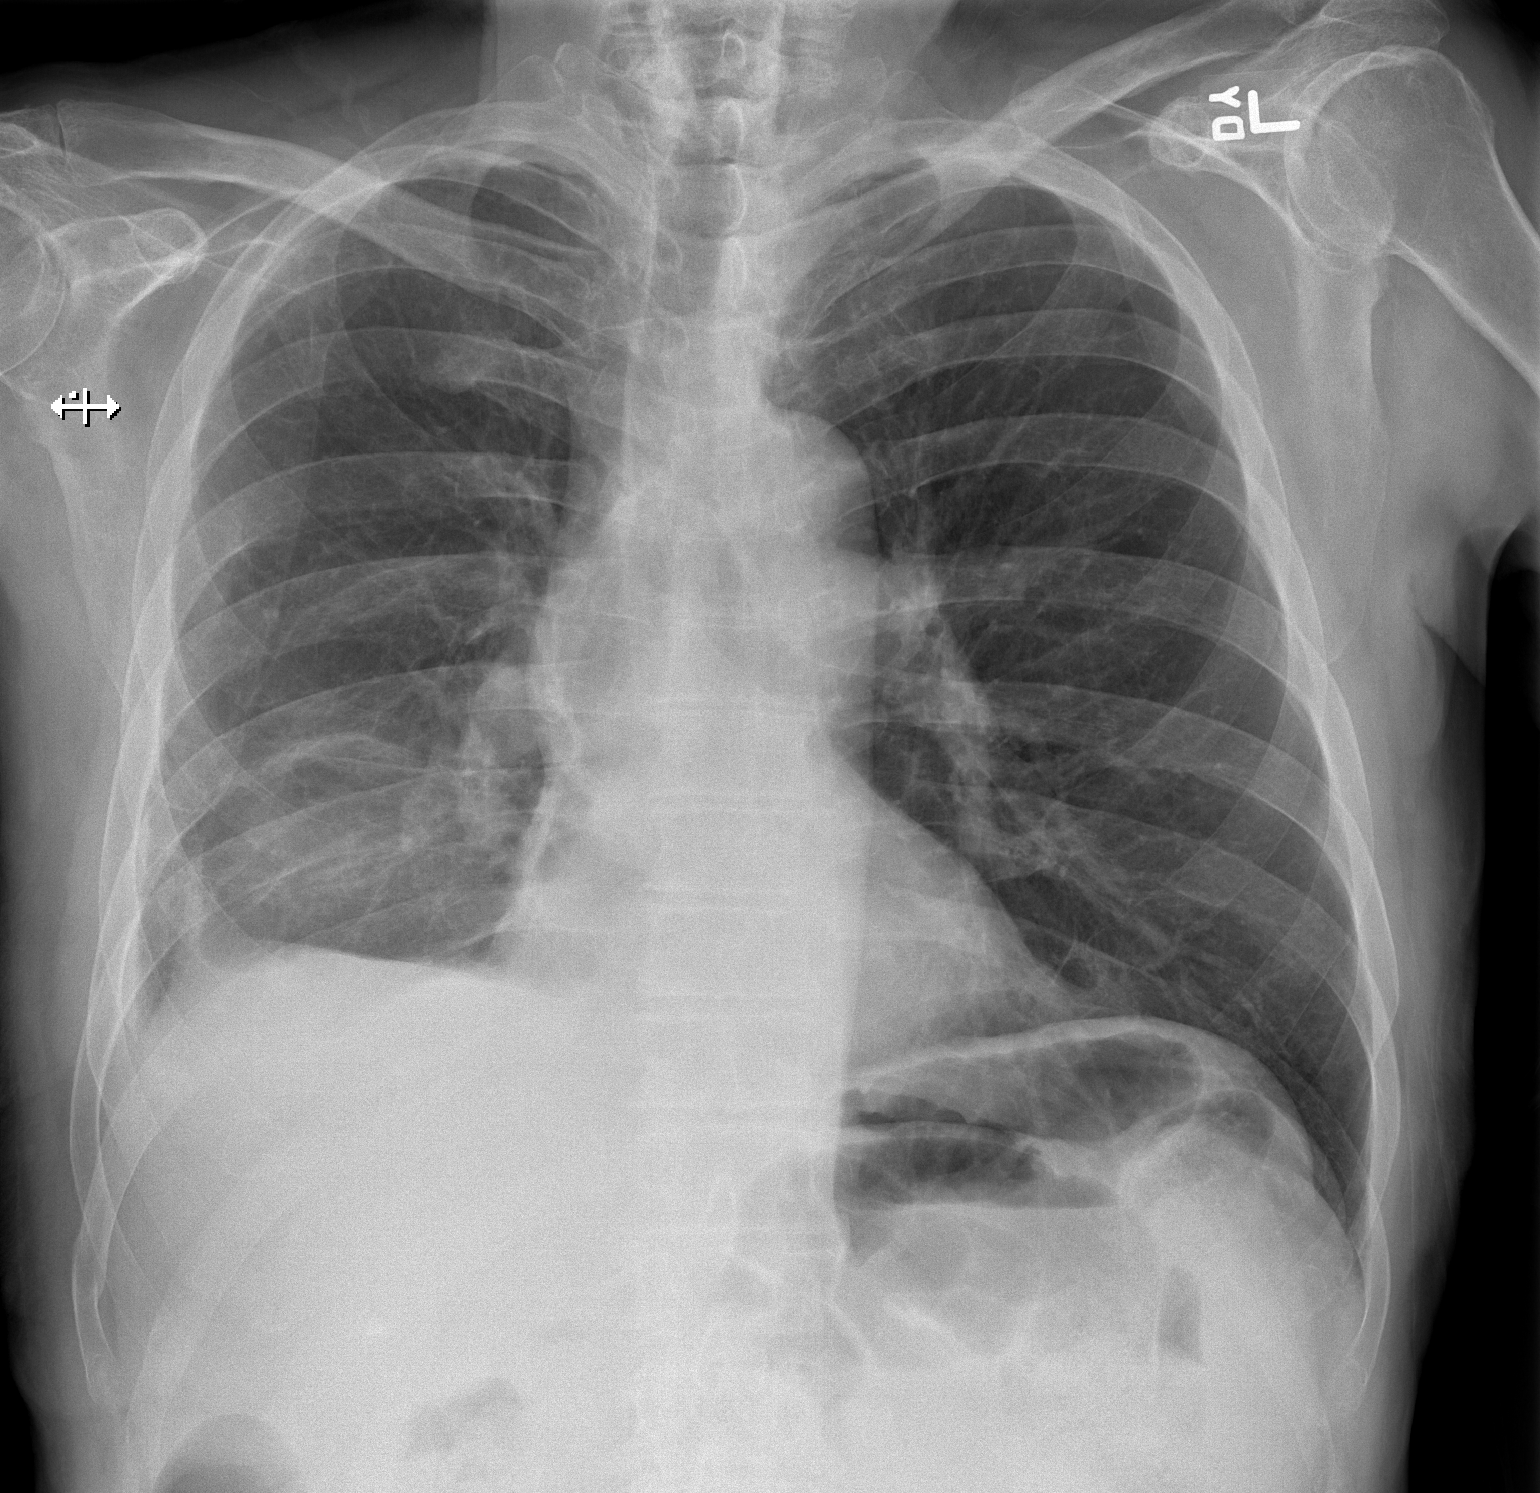
[im 2/2]
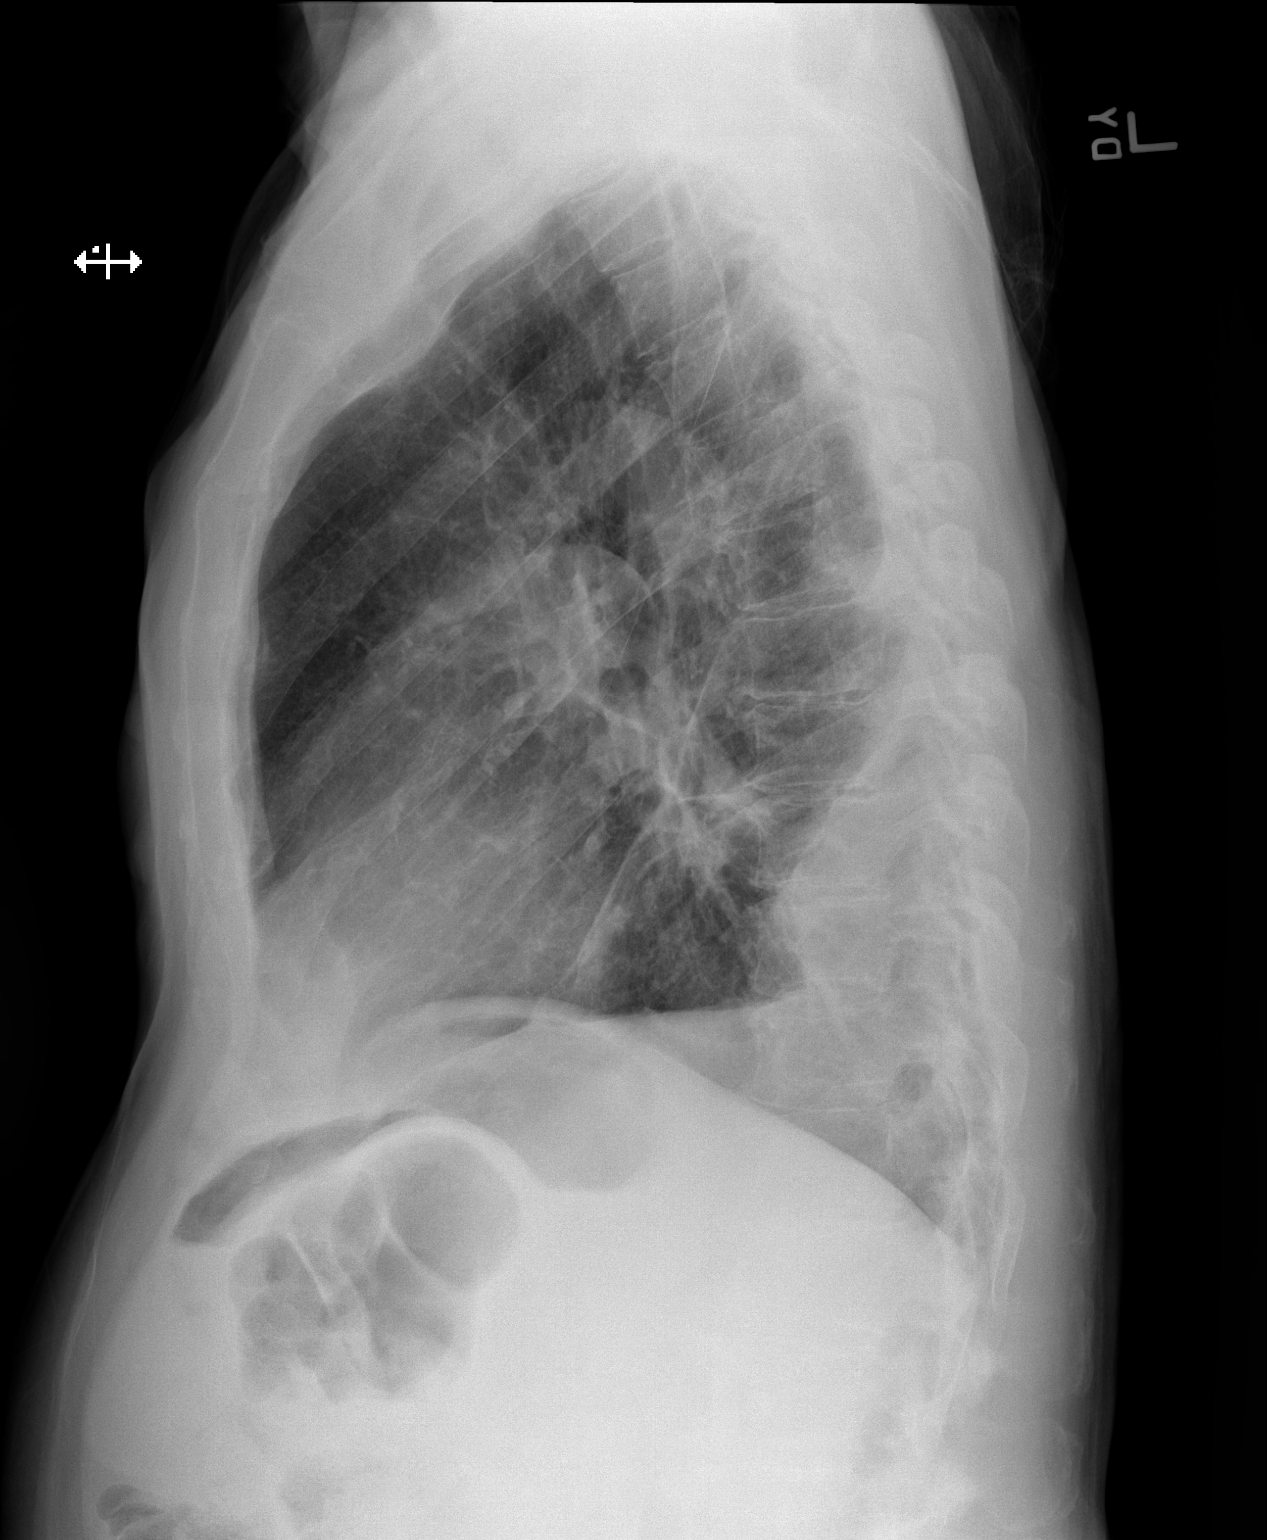

[2 of 2 positions shown; findings below may reference images not displayed]

FINDINGS: There is persistent mild volume loss on the right. The pleural
thickening and/or pleural fluid at the right lung base posteriorly
and laterally laterally has decreased. The left lung is clear. The
heart and pulmonary vascularity are normal. There is tortuosity of
the ascending thoracic aorta. The trachea is midline. The bony
thorax exhibits no acute abnormality.
IMPRESSION: Interval decrease in the volume of the right-sided empyema.
Otherwise stable examination.

## 2018-06-13 ENCOUNTER — Encounter
Admission: RE | Admit: 2018-06-13 | Discharge: 2018-06-13 | Disposition: A | Discharge status: Home / Self Care | Payer: Medicare Other | Source: Ambulatory Visit | Attending: Urology | Admitting: Urology

## 2018-06-13 DIAGNOSIS — Z01818 Encounter for other preprocedural examination: Secondary | ICD-10-CM | POA: Diagnosis present

## 2018-06-13 DIAGNOSIS — E785 Hyperlipidemia, unspecified: Secondary | ICD-10-CM | POA: Insufficient documentation

## 2018-06-13 DIAGNOSIS — R001 Bradycardia, unspecified: Secondary | ICD-10-CM | POA: Diagnosis not present

## 2018-06-13 DIAGNOSIS — R9431 Abnormal electrocardiogram [ECG] [EKG]: Secondary | ICD-10-CM | POA: Diagnosis not present

## 2018-06-13 LAB — CBC
HEMATOCRIT: 39.8 % (ref 39.0–52.0)
Hemoglobin: 13.2 g/dL (ref 13.0–17.0)
MCH: 30 pg (ref 26.0–34.0)
MCHC: 33.2 g/dL (ref 30.0–36.0)
MCV: 90.5 fL (ref 80.0–100.0)
PLATELETS: 165 10*3/uL (ref 150–400)
RBC: 4.4 MIL/uL (ref 4.22–5.81)
RDW: 12.8 % (ref 11.5–15.5)
WBC: 5.2 10*3/uL (ref 4.0–10.5)
nRBC: 0 % (ref 0.0–0.2)

## 2018-06-13 NOTE — Patient Instructions (Signed)
Your procedure is scheduled on: Tuesday 06/20/18 Report to [place]. To find out your arrival time please call [phone] between 1PM - 3PM on Monday 06/19/18.  Remember: Instructions that are not followed completely may result in serious medical risk, up to and including death, or upon the discretion of your surgeon and anesthesiologist your surgery may need to be rescheduled.     _X__ 1. Do not eat food after midnight the night before your procedure.                 No gum chewing or hard candies. You may drink clear liquids up to 2 hours                 before you are scheduled to arrive for your surgery- DO not drink clear                 liquids within 2 hours of the start of your surgery.                 Clear Liquids include:  water, apple juice without pulp, clear carbohydrate                 drink such as Clearfast or Gatorade, Black Coffee or Tea (Do not add                 anything to coffee or tea).  __X__2.  On the morning of surgery brush your teeth with toothpaste and water, you                 may rinse your mouth with mouthwash if you wish.  Do not swallow any              toothpaste of mouthwash.     _X__ 3.  No Alcohol for 24 hours before or after surgery.   _X__ 4.  Do Not Smoke or use e-cigarettes For 24 Hours Prior to Your Surgery.                 Do not use any chewable tobacco products for at least 6 hours prior to                 surgery.  ____  5.  Bring all medications with you on the day of surgery if instructed.   __X__  6.  Notify your doctor if there is any change in your medical condition      (cold, fever, infections).     Do not wear jewelry, make-up, hairpins, clips or nail polish. Do not wear lotions, powders, or perfumes.  Do not shave 48 hours prior to surgery. Men may shave face and neck. Do not bring valuables to the hospital.    [facility] is not responsible for any belongings or  valuables.  Contacts, dentures/partials or body piercings may not be worn into surgery. Bring a case for your contacts, glasses or hearing aids, a denture cup will be supplied. Leave your suitcase in the car. After surgery it may be brought to your room. For patients admitted to the hospital, discharge time is determined by your treatment team.   Patients discharged the day of surgery will not be allowed to drive home.   Please read over the following fact sheets that you were given:   MRSA Information  __X__ Take these medicines the morning of surgery with A SIP OF WATER:  1. NONE  2.   3.   4.  5.  6.  ____ Fleet Enema (as directed)   ____ Use CHG Soap/SAGE wipes as directed  ____ Use inhalers on the day of surgery  ____ Stop metformin/Janumet/Farxiga 2 days prior to surgery    ____ Take 1/2 of usual insulin dose the night before surgery. No insulin the morning          of surgery.   ____ Stop Blood Thinners Coumadin/Plavix/Xarelto/Pleta/Pradaxa/Eliquis/Effient/Aspirin  on   Or contact your Surgeon, Cardiologist or Medical Doctor regarding  ability to stop your blood thinners  __X__ Stop Anti-inflammatories 7 days before surgery such as Advil, Ibuprofen, Motrin,  BC or Goodies Powder, Naprosyn, Naproxen, Aleve, Aspirin    __X__ Stop all herbal supplements, fish oil or vitamin E until after surgery.  STOP GLUCOSAMINE TODAY   ____ Bring C-Pap to the hospital.

## 2018-06-14 NOTE — H&P (Signed)
NAME: Jeffery [name], [name] MEDICAL RECORD PP:[id number] ACCOUNT [id number] DATE OF BIRTH:02-04-1931 FACILITY: [facility] LOCATION: [facility] PHYSICIAN:MICHAEL Farrel Conners, MD  HISTORY AND PHYSICAL  DATE OF ADMISSION:  06/20/2018  CHIEF COMPLAINT:  Difficulty voiding.  HISTORY OF PRESENT ILLNESS:  The patient is an 84 year old white male with a long history of BPH and lower urinary tract symptoms.  He has been on tamsulosin 0.4 mg for several years.  His symptoms have worsened recently.  A Uroflow study indicated a  maximum flow rate of 2 mL per second.  He was also found to have an elevated PSA and underwent ultrasound-guided biopsy of the prostate.  Ultrasound revealed a 63.2 g prostate.  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  He comes in now for photovaporization of the prostate for GreenLight laser  and will subsequently have treatment for prostate cancer after the obstruction has been relieved.  ALLERGIES:  No drug allergies.  CURRENT MEDICATIONS:  Combigan eyedrops, tamsulosin, aspirin, glucosamine chondroitin, antioxidants, Super Beta complex, fish oil, vitamin D3, diphenhydramine and donepezil.  PAST SURGICAL HISTORY:  Tonsillectomy in 1946, left inguinal herniorrhaphy in 2014.  SOCIAL HISTORY:  The patient quit smoking in 1970 with a 5-pack-year history.  He denied alcohol use.  FAMILY HISTORY:  Father died at age 61 of COPD.  Mother died at age 57 of old age.  PAST AND CURRENT MEDICAL CONDITIONS: 1.  Glaucoma. 2.  Cognitive impairment.  REVIEW OF SYSTEMS:  The patient denies chest pain, heart disease, stroke or hypertension.  PHYSICAL EXAMINATION: GENERAL:  A well-nourished white male in no acute distress. HEENT:  Sclerae were clear.  Pupils were equally round,  reactive to light and accommodation.  Extraocular movements were intact. NECK:  No palpable cervical adenopathy. PULMONARY:  Lungs clear to auscultation. CARDIOVASCULAR:  Regular rhythm and rate without audible murmurs. ABDOMEN:  Soft, nontender abdomen. GENITOURINARY:  Circumcised, testes smooth, nontender, 18 mL in size each. RECTAL:  Greater than 50 g, smooth, nontender prostate. NEUROMUSCULAR:  Alert and oriented x3.  IMPRESSION: 1.  Benign prostatic hypertrophy with bladder outlet obstruction. 2.  Stage T1c, Gleason grade 4+3 adenocarcinoma of the prostate.  PLAN:  Photovaporization of prostate with GreenLight laser.  LN/NUANCE  D:06/13/2018 T:06/13/2018 JOB:[id number]

## 2018-06-20 ENCOUNTER — Ambulatory Visit
Admission: RE | Admit: 2018-06-20 | Discharge: 2018-06-20 | Disposition: A | Discharge status: Home / Self Care | Payer: Medicare Other | Source: Ambulatory Visit | Attending: Urology | Admitting: Urology

## 2018-06-20 ENCOUNTER — Ambulatory Visit: Payer: Medicare Other | Admitting: Anesthesiology

## 2018-06-20 ENCOUNTER — Other Ambulatory Visit: Payer: Self-pay

## 2018-06-20 ENCOUNTER — Encounter
Admission: RE | Disposition: A | Discharge status: Home / Self Care | Payer: Self-pay | Source: Ambulatory Visit | Attending: Urology

## 2018-06-20 DIAGNOSIS — C61 Malignant neoplasm of prostate: Secondary | ICD-10-CM | POA: Diagnosis not present

## 2018-06-20 DIAGNOSIS — N401 Enlarged prostate with lower urinary tract symptoms: Secondary | ICD-10-CM | POA: Diagnosis not present

## 2018-06-20 DIAGNOSIS — N320 Bladder-neck obstruction: Secondary | ICD-10-CM | POA: Insufficient documentation

## 2018-06-20 DIAGNOSIS — E785 Hyperlipidemia, unspecified: Secondary | ICD-10-CM | POA: Insufficient documentation

## 2018-06-20 DIAGNOSIS — Z87891 Personal history of nicotine dependence: Secondary | ICD-10-CM | POA: Diagnosis not present

## 2018-06-20 DIAGNOSIS — Z7982 Long term (current) use of aspirin: Secondary | ICD-10-CM | POA: Insufficient documentation

## 2018-06-20 DIAGNOSIS — N138 Other obstructive and reflux uropathy: Secondary | ICD-10-CM

## 2018-06-20 HISTORY — PX: GREEN LIGHT LASER TURP (TRANSURETHRAL RESECTION OF PROSTATE: SHX6260

## 2018-06-20 SURGERY — GREEN LIGHT LASER TURP (TRANSURETHRAL RESECTION OF PROSTATE
Anesthesia: General | Site: Prostate

## 2018-06-20 MED ORDER — MIDAZOLAM HCL 2 MG/2ML IJ SOLN
INTRAMUSCULAR | Status: AC
  Filled 2018-06-20: qty 2

## 2018-06-20 MED ORDER — LIDOCAINE HCL (CARDIAC) PF 100 MG/5ML IV SOSY
PREFILLED_SYRINGE | INTRAVENOUS | Status: DC | PRN
  Administered 2018-06-20: 80 mg via INTRAVENOUS

## 2018-06-20 MED ORDER — EPHEDRINE SULFATE 50 MG/ML IJ SOLN
INTRAMUSCULAR | Status: AC
  Filled 2018-06-20: qty 1

## 2018-06-20 MED ORDER — LIDOCAINE HCL URETHRAL/MUCOSAL 2 % EX GEL
CUTANEOUS | Status: AC
  Filled 2018-06-20: qty 10

## 2018-06-20 MED ORDER — BELLADONNA ALKALOIDS-OPIUM 16.2-60 MG RE SUPP
RECTAL | Status: DC | PRN
  Administered 2018-06-20: 1 via RECTAL

## 2018-06-20 MED ORDER — FENTANYL CITRATE (PF) 100 MCG/2ML IJ SOLN
INTRAMUSCULAR | Status: AC
  Filled 2018-06-20: qty 2

## 2018-06-20 MED ORDER — URIBEL 118 MG PO CAPS: 1.0000 | ORAL_CAPSULE | Freq: Four times a day (QID) | ORAL | 3 refills | Status: DC | PRN

## 2018-06-20 MED ORDER — PHENYLEPHRINE HCL 10 MG/ML IJ SOLN
INTRAMUSCULAR | Status: DC | PRN
  Administered 2018-06-20 (×5): 100 ug via INTRAVENOUS

## 2018-06-20 MED ORDER — FAMOTIDINE 20 MG PO TABS
ORAL_TABLET | ORAL | Status: AC
  Administered 2018-06-20: 20 mg via ORAL
  Filled 2018-06-20: qty 1

## 2018-06-20 MED ORDER — LEVOFLOXACIN IN D5W 500 MG/100ML IV SOLN
INTRAVENOUS | Status: AC
  Filled 2018-06-20: qty 100

## 2018-06-20 MED ORDER — ONDANSETRON HCL 4 MG/2ML IJ SOLN
INTRAMUSCULAR | Status: DC | PRN
  Administered 2018-06-20: 4 mg via INTRAVENOUS

## 2018-06-20 MED ORDER — FAMOTIDINE 20 MG PO TABS
20.0000 mg | ORAL_TABLET | Freq: Once | ORAL | Status: AC
  Administered 2018-06-20: 20 mg via ORAL

## 2018-06-20 MED ORDER — CIPROFLOXACIN HCL 500 MG PO TABS: 500.0000 mg | ORAL_TABLET | Freq: Two times a day (BID) | ORAL | 0 refills | Status: DC

## 2018-06-20 MED ORDER — DOCUSATE SODIUM 100 MG PO CAPS: 200.0000 mg | ORAL_CAPSULE | Freq: Two times a day (BID) | ORAL | 3 refills | Status: DC

## 2018-06-20 MED ORDER — PROPOFOL 10 MG/ML IV BOLUS
INTRAVENOUS | Status: AC
  Filled 2018-06-20: qty 20

## 2018-06-20 MED ORDER — HYDROMORPHONE HCL 1 MG/ML IJ SOLN
INTRAMUSCULAR | Status: AC
  Filled 2018-06-20: qty 1

## 2018-06-20 MED ORDER — LEVOFLOXACIN IN D5W 500 MG/100ML IV SOLN
500.0000 mg | Freq: Once | INTRAVENOUS | Status: AC
  Administered 2018-06-20: 500 mg via INTRAVENOUS

## 2018-06-20 MED ORDER — DEXAMETHASONE SODIUM PHOSPHATE 10 MG/ML IJ SOLN
INTRAMUSCULAR | Status: DC | PRN
  Administered 2018-06-20: 10 mg via INTRAVENOUS

## 2018-06-20 MED ORDER — LACTATED RINGERS IV SOLN
INTRAVENOUS | Status: DC | PRN
  Administered 2018-06-20 (×2): via INTRAVENOUS

## 2018-06-20 MED ORDER — BELLADONNA ALKALOIDS-OPIUM 16.2-60 MG RE SUPP
RECTAL | Status: AC
  Filled 2018-06-20: qty 1

## 2018-06-20 MED ORDER — LIDOCAINE HCL URETHRAL/MUCOSAL 2 % EX GEL
CUTANEOUS | Status: DC | PRN
  Administered 2018-06-20: 1 via URETHRAL

## 2018-06-20 MED ORDER — EPHEDRINE SULFATE 50 MG/ML IJ SOLN
INTRAMUSCULAR | Status: DC | PRN
  Administered 2018-06-20 (×3): 10 mg via INTRAVENOUS

## 2018-06-20 MED ORDER — FENTANYL CITRATE (PF) 100 MCG/2ML IJ SOLN
INTRAMUSCULAR | Status: DC | PRN
  Administered 2018-06-20: 25 ug via INTRAVENOUS
  Administered 2018-06-20: 50 ug via INTRAVENOUS
  Administered 2018-06-20: 25 ug via INTRAVENOUS
  Administered 2018-06-20: 50 ug via INTRAVENOUS
  Administered 2018-06-20 (×2): 25 ug via INTRAVENOUS

## 2018-06-20 MED ORDER — PROPOFOL 10 MG/ML IV BOLUS
INTRAVENOUS | Status: DC | PRN
  Administered 2018-06-20: 140 mg via INTRAVENOUS
  Administered 2018-06-20: 60 mg via INTRAVENOUS

## 2018-06-20 MED ORDER — SODIUM CHLORIDE 0.9 % IV SOLN
INTRAVENOUS | Status: DC | PRN
  Administered 2018-06-20: 50 ug/min via INTRAVENOUS

## 2018-06-20 MED ORDER — SUCCINYLCHOLINE CHLORIDE 20 MG/ML IJ SOLN
INTRAMUSCULAR | Status: DC | PRN
  Administered 2018-06-20: 120 mg via INTRAVENOUS

## 2018-06-20 MED ORDER — ACETAMINOPHEN-CODEINE #3 300-30 MG PO TABS: 1.0000 | ORAL_TABLET | ORAL | 1 refills | Status: DC | PRN

## 2018-06-20 SURGICAL SUPPLY — 21 items
ADAPTER IRRIG TUBE 2 SPIKE SOL (ADAPTER) ×6 IMPLANT
BAG URINE DRAINAGE (UROLOGICAL SUPPLIES) ×3 IMPLANT
CATH FOLEY 2WAY  5CC 20FR SIL (CATHETERS)
CATH FOLEY 2WAY 5CC 20FR SIL (CATHETERS) IMPLANT
CATH FOLEY 2WAY SIL 22X30 (CATHETERS) ×2 IMPLANT
GLOVE BIO SURGEON STRL SZ7.5 (GLOVE) ×5 IMPLANT
GOWN STRL REUS W/ TWL LRG LVL3 (GOWN DISPOSABLE) ×1 IMPLANT
GOWN STRL REUS W/ TWL XL LVL3 (GOWN DISPOSABLE) ×1 IMPLANT
GOWN STRL REUS W/TWL LRG LVL3 (GOWN DISPOSABLE) ×2
GOWN STRL REUS W/TWL XL LVL3 (GOWN DISPOSABLE) ×2
IV NS 1000ML (IV SOLUTION) ×2
IV NS 1000ML BAXH (IV SOLUTION) ×1 IMPLANT
IV SET PRIMARY 15D 139IN B9900 (IV SETS) ×3 IMPLANT
KIT TURNOVER CYSTO (KITS) ×3 IMPLANT
PACK CYSTO AR (MISCELLANEOUS) ×3 IMPLANT
SET IRRIG Y TYPE TUR BLADDER L (SET/KITS/TRAYS/PACK) ×3 IMPLANT
SOL .9 NS 3000ML IRR  AL (IV SOLUTION) ×8
SOL .9 NS 3000ML IRR UROMATIC (IV SOLUTION) ×4 IMPLANT
SURGILUBE 2OZ TUBE FLIPTOP (MISCELLANEOUS) ×3 IMPLANT
SYRINGE IRR TOOMEY STRL 70CC (SYRINGE) ×3 IMPLANT
WATER STERILE IRR 1000ML POUR (IV SOLUTION) ×3 IMPLANT

## 2018-06-20 NOTE — Anesthesia Post-op Follow-up Note (Signed)
Anesthesia QCDR form completed.        

## 2018-06-20 NOTE — Anesthesia Procedure Notes (Signed)
Procedure Name: Intubation Date/Time: 06/20/2018 1:40 PM Performed by: Justus Memory, CRNA Pre-anesthesia Checklist: Patient identified, Patient being monitored, Timeout performed, Emergency Drugs available and Suction available Patient Re-evaluated:Patient Re-evaluated prior to induction Oxygen Delivery Method: Circle system utilized Preoxygenation: Pre-oxygenation with 100% oxygen Induction Type: IV induction Ventilation: Mask ventilation without difficulty Laryngoscope Size: Mac and 3 Grade View: Grade II Tube type: Oral Tube size: 7.0 mm Number of attempts: 1 Airway Equipment and Method: Stylet Placement Confirmation: ETT inserted through vocal cords under direct vision,  positive ETCO2 and breath sounds checked- equal and bilateral Secured at: 21 cm Tube secured with: Tape Dental Injury: Teeth and Oropharynx as per pre-operative assessment

## 2018-06-20 NOTE — Progress Notes (Signed)
Pt urine starting to get darker. Dr. Yves Dill notified. He came and saw it and requested for foley catheter to be irrigated and then pt could go to [place]. Pt foley catheter irrigated. Zaahir Pickney E 5:24 PM 06/20/2018

## 2018-06-20 NOTE — Discharge Instructions (Signed)
AMBULATORY SURGERY  DISCHARGE INSTRUCTIONS   1) The drugs that you were given will stay in your system until tomorrow so for the next 24 hours you should not:  A) Drive an automobile B) Make any legal decisions C) Drink any alcoholic beverage   2) You may resume regular meals tomorrow.  Today it is better to start with liquids and gradually work up to solid foods.  You may eat anything you prefer, but it is better to start with liquids, then soup and crackers, and gradually work up to solid foods.   3) Please notify your doctor immediately if you have any unusual bleeding, trouble breathing, redness and pain at the surgery site, drainage, fever, or pain not relieved by medication.    4) Additional Instructions:        Please contact your physician with any problems or Same Day Surgery at [phone], Monday through Friday 6 am to 4 pm, or [place] at [facility] number at [phone].Benign Prostatic Hyperplasia Benign prostatic hyperplasia (BPH) is an enlarged prostate gland that is caused by the normal aging process and not by cancer. The prostate is a walnut-sized gland that is involved in the production of semen. It is located in front of the rectum and below the bladder. The bladder stores urine and the urethra is the tube that carries the urine out of the body. The prostate may get bigger as a man gets older. An enlarged prostate can press on the urethra. This can make it harder to pass urine. The build-up of urine in the bladder can cause infection. Back pressure and infection may progress to bladder damage and kidney (renal) failure. What are the causes? This condition is part of a normal aging process. However, not all men develop problems from this condition. If the prostate enlarges away from the urethra, urine flow will not be blocked. If it enlarges toward the urethra and compresses it, there will be problems passing urine. What increases the risk? This condition  is more likely to develop in men over the age of 64 years. What are the signs or symptoms? Symptoms of this condition include:  Getting up often during the night to urinate.  Needing to urinate frequently during the day.  Difficulty starting urine flow.  Decrease in size and strength of your urine stream.  Leaking (dribbling) after urinating.  Inability to pass urine. This needs immediate treatment.  Inability to completely empty your bladder.  Pain when you pass urine. This is more common if there is also an infection.  Urinary tract infection (UTI).  How is this diagnosed? This condition is diagnosed based on your medical history, a physical exam, and your symptoms. Tests will also be done, such as:  A post-void bladder scan. This measures any amount of urine that may remain in your bladder after you finish urinating.  A digital rectal exam. In a rectal exam, your health care provider checks your prostate by putting a lubricated, gloved finger into your rectum to feel the back of your prostate gland. This exam detects the size of your gland and any abnormal lumps or growths.  An exam of your urine (urinalysis).  A prostate specific antigen (PSA) screening. This is a blood test used to screen for prostate cancer.  An ultrasound. This test uses sound waves to electronically produce a picture of your prostate gland.  Your health care provider may refer you to a specialist in kidney and prostate diseases (urologist). How is this treated?  Once symptoms begin, your health care provider will monitor your condition (active surveillance or watchful waiting). Treatment for this condition will depend on the severity of your condition. Treatment may include:  Observation and yearly exams. This may be the only treatment needed if your condition and symptoms are mild.  Medicines to relieve your symptoms, including: ? Medicines to shrink the prostate. ? Medicines to relax the muscle of  the prostate.  Surgery in severe cases. Surgery may include: ? Prostatectomy. In this procedure, the prostate tissue is removed completely through an open incision or with a laparascope or robotics. ? Transurethral resection of the prostate (TURP). In this procedure, a tool is inserted through the opening at the tip of the penis (urethra). It is used to cut away tissue of the inner core of the prostate. The pieces are removed through the same opening of the penis. This removes the blockage. ? Transurethral incision (TUIP). In this procedure, small cuts are made in the prostate. This lessens the prostate's pressure on the urethra. ? Transurethral microwave thermotherapy (TUMT). This procedure uses microwaves to create heat. The heat destroys and removes a small amount of prostate tissue. ? Transurethral needle ablation (TUNA). This procedure uses radio frequencies to destroy and remove a small amount of prostate tissue. ? Interstitial laser coagulation ([place]). This procedure uses a laser to destroy and remove a small amount of prostate tissue. ? Transurethral electrovaporization (TUVP). This procedure uses electrodes to destroy and remove a small amount of prostate tissue. ? Prostatic urethral lift. This procedure inserts an implant to push the lobes of the prostate away from the urethra.  Follow these instructions at home:  Take over-the-counter and prescription medicines only as told by your health care provider.  Monitor your symptoms for any changes. Contact your health care provider with any changes.  Avoid drinking large amounts of liquid before going to bed or out in public.  Avoid or reduce how much caffeine or alcohol you drink.  Give yourself time when you urinate.  Keep all follow-up visits as told by your health care provider. This is important. Contact a health care provider if:  You have unexplained back pain.  Your symptoms do not get better with treatment.  You develop  side effects from the medicine you are taking.  Your urine becomes very dark or has a bad smell.  Your lower abdomen becomes distended and you have trouble passing your urine. Get help right away if:  You have a fever or chills.  You suddenly cannot urinate.  You feel lightheaded, or very dizzy, or you faint.  There are large amounts of blood or clots in the urine.  Your urinary problems become hard to manage.  You develop moderate to severe low back or flank pain. The flank is the side of your body between the ribs and the hip. These symptoms may represent a serious problem that is an emergency. Do not wait to see if the symptoms will go away. Get medical help right away. Call your local emergency services (911 in the [place] Do not drive yourself to the hospital. Summary  Benign prostatic hyperplasia (BPH) is an enlarged prostate that is caused by the normal aging process and not by cancer.  An enlarged prostate can press on the urethra. This can make it hard to pass urine.  This condition is part of a normal aging process and is more likely to develop in men over the age of 16 years.  Get help right  away if you suddenly cannot urinate. This information is not intended to replace advice given to you by your health care provider. Make sure you discuss any questions you have with your health care provider. Document Released: 07/05/2005 Document Revised: 08/09/2016 Document Reviewed: 08/09/2016 Elsevier Interactive Patient Education  2018 [employer]   Benign Prostatic Hyperplasia Benign prostatic hyperplasia (BPH) is an enlarged prostate gland that is caused by the normal aging process and not by cancer. The prostate is a walnut-sized gland that is involved in the production of semen. It is located in front of the rectum and below the bladder. The bladder stores urine and the urethra is the tube that carries the urine out of the body. The prostate may get bigger as a man gets  older. An enlarged prostate can press on the urethra. This can make it harder to pass urine. The build-up of urine in the bladder can cause infection. Back pressure and infection may progress to bladder damage and kidney (renal) failure. What are the causes? This condition is part of a normal aging process. However, not all men develop problems from this condition. If the prostate enlarges away from the urethra, urine flow will not be blocked. If it enlarges toward the urethra and compresses it, there will be problems passing urine. What increases the risk? This condition is more likely to develop in men over the age of 52 years. What are the signs or symptoms? Symptoms of this condition include:  Getting up often during the night to urinate.  Needing to urinate frequently during the day.  Difficulty starting urine flow.  Decrease in size and strength of your urine stream.  Leaking (dribbling) after urinating.  Inability to pass urine. This needs immediate treatment.  Inability to completely empty your bladder.  Pain when you pass urine. This is more common if there is also an infection.  Urinary tract infection (UTI).  How is this diagnosed? This condition is diagnosed based on your medical history, a physical exam, and your symptoms. Tests will also be done, such as:  A post-void bladder scan. This measures any amount of urine that may remain in your bladder after you finish urinating.  A digital rectal exam. In a rectal exam, your health care provider checks your prostate by putting a lubricated, gloved finger into your rectum to feel the back of your prostate gland. This exam detects the size of your gland and any abnormal lumps or growths.  An exam of your urine (urinalysis).  A prostate specific antigen (PSA) screening. This is a blood test used to screen for prostate cancer.  An ultrasound. This test uses sound waves to electronically produce a picture of your prostate  gland.  Your health care provider may refer you to a specialist in kidney and prostate diseases (urologist). How is this treated? Once symptoms begin, your health care provider will monitor your condition (active surveillance or watchful waiting). Treatment for this condition will depend on the severity of your condition. Treatment may include:  Observation and yearly exams. This may be the only treatment needed if your condition and symptoms are mild.  Medicines to relieve your symptoms, including: ? Medicines to shrink the prostate. ? Medicines to relax the muscle of the prostate.  Surgery in severe cases. Surgery may include: ? Prostatectomy. In this procedure, the prostate tissue is removed completely through an open incision or with a laparascope or robotics. ? Transurethral resection of the prostate (TURP). In this procedure, a tool is  inserted through the opening at the tip of the penis (urethra). It is used to cut away tissue of the inner core of the prostate. The pieces are removed through the same opening of the penis. This removes the blockage. ? Transurethral incision (TUIP). In this procedure, small cuts are made in the prostate. This lessens the prostate's pressure on the urethra. ? Transurethral microwave thermotherapy (TUMT). This procedure uses microwaves to create heat. The heat destroys and removes a small amount of prostate tissue. ? Transurethral needle ablation (TUNA). This procedure uses radio frequencies to destroy and remove a small amount of prostate tissue. ? Interstitial laser coagulation ([place]). This procedure uses a laser to destroy and remove a small amount of prostate tissue. ? Transurethral electrovaporization (TUVP). This procedure uses electrodes to destroy and remove a small amount of prostate tissue. ? Prostatic urethral lift. This procedure inserts an implant to push the lobes of the prostate away from the urethra.  Follow these instructions at home:  Take  over-the-counter and prescription medicines only as told by your health care provider.  Monitor your symptoms for any changes. Contact your health care provider with any changes.  Avoid drinking large amounts of liquid before going to bed or out in public.  Avoid or reduce how much caffeine or alcohol you drink.  Give yourself time when you urinate.  Keep all follow-up visits as told by your health care provider. This is important. Contact a health care provider if:  You have unexplained back pain.  Your symptoms do not get better with treatment.  You develop side effects from the medicine you are taking.  Your urine becomes very dark or has a bad smell.  Your lower abdomen becomes distended and you have trouble passing your urine. Get help right away if:  You have a fever or chills.  You suddenly cannot urinate.  You feel lightheaded, or very dizzy, or you faint.  There are large amounts of blood or clots in the urine.  Your urinary problems become hard to manage.  You develop moderate to severe low back or flank pain. The flank is the side of your body between the ribs and the hip. These symptoms may represent a serious problem that is an emergency. Do not wait to see if the symptoms will go away. Get medical help right away. Call your local emergency services (911 in the [place] Do not drive yourself to the hospital. Summary  Benign prostatic hyperplasia (BPH) is an enlarged prostate that is caused by the normal aging process and not by cancer.  An enlarged prostate can press on the urethra. This can make it hard to pass urine.  This condition is part of a normal aging process and is more likely to develop in men over the age of 56 years.  Get help right away if you suddenly cannot urinate. This information is not intended to replace advice given to you by your health care provider. Make sure you discuss any questions you have with your health care provider. Document  Released: 07/05/2005 Document Revised: 08/09/2016 Document Reviewed: 08/09/2016 Elsevier Interactive Patient Education  2018 [place] Light Laser Prostate Treatment Green light laser therapy is a procedure that uses a high-energy laser to get rid of extra prostate tissue by turning the tissue into a vapor. It is less invasive than traditional methods of prostate surgery, which involve cutting out the prostate tissue. Because the tissue is turned into a vapor (vaporized) rather than cut  out, there is generally less blood loss. This surgery is used to treat an enlarged prostate gland (benign prostatic hyperplasia). Tell a health care provider about:  Any allergies you have.  All medicines you are taking, including vitamins, herbs, eye drops, creams, and over-the-counter medicines.  Any problems you or family members have had with anesthetic medicines.  Any blood disorders you have.  Any surgeries you have had.  Any medical conditions you have. What are the risks? Generally, this is a safe procedure. However, problems may occur, including:  Infection.  Bleeding.  Allergic reaction to medicines.  Damage to other structures or organs.  Blood in the urine (hematuria).  Painful urination.  Urinary tract infection.  Erectile dysfunction (rare).  Dry ejaculation.  Scar tissue in the urinary passage.  What happens before the procedure? Staying hydrated Follow instructions from your health care provider about hydration, which may include:  Up to 2 hours before the procedure - you may continue to drink clear liquids, such as water, clear fruit juice, black coffee, and plain tea.  Eating and drinking restrictions Follow instructions from your health care provider about eating and drinking, which may include:  8 hours before the procedure - stop eating heavy meals or foods such as meat, fried foods, or fatty foods.  6 hours before the procedure - stop eating  light meals or foods, such as toast or cereal.  6 hours before the procedure - stop drinking milk or drinks that contain milk.  2 hours before the procedure - stop drinking clear liquids.  Medicines  Ask your health care provider about: ? Changing or stopping your regular medicines. This is especially important if you are taking diabetes medicines or blood thinners. ? Taking medicines such as aspirin and ibuprofen. These medicines can thin your blood. Do not take these medicines before your procedure if your health care provider instructs you not to.  You may be prescribed antibiotic medicine. If so, take your antibiotic as told by your health care provider. Do not stop taking the antibiotic even if you start to feel better. General instructions  Plan to have someone take you home from the hospital or clinic.  If you will be going home right after the procedure, plan to have someone with you for 24 hours. What happens during the procedure?  To reduce your risk of infection: ? Your health care team will wash or sanitize their hands. ? Your skin will be washed with soap.  You will be given one or more of the following: ? A medicine to help you relax (sedative). ? A medicine to make you fall asleep (general anesthetic). ? A medicine that is injected into your spine to numb the area below and slightly above the injection site (spinal anesthetic).  A tube containing viewing scopes and instruments (fiber-optic scope) will be inserted through your penis.  A thin fiber will be put through the tube and positioned next to the extra prostate tissue.  Pulses of laser light will come from the end of the fiber and be projected onto the extra tissue. Your blood will absorb the light, become hot, and vaporize the extra prostate tissue.  The heat from the laser beam will seal off the blood vessels, which will lessen bleeding.  The fiber-optic scope will be removed and replaced with a temporary  tube (catheter) that is used to help urine flow. The procedure may vary among health care providers and hospitals. What happens after the procedure?  Your blood  pressure, heart rate, breathing rate, and blood oxygen level will be monitored until the medicines you were given have worn off.  Depending on factors such as the amount of prostate tissue that was vaporized, the strength of your bladder, and the amount of bleeding expected, your catheter may be removed.  You may be given elastic support stockings to wear to help prevent blood clots in your legs.  Do not drive for 24 hours if you were given a sedative, or for as long as directed by your health care provider. Summary  Green light laser therapy is a procedure that uses a high-energy laser that turns extra prostate tissue into a vapor.  This procedure is less invasive than traditional methods of prostate surgery.  Follow instructions from your health care provider about eating and drinking before the procedure.  Pulses of laser light will come from the end of a thin fiber and be aimed at the extra prostate tissue. Your blood will absorb the light, become hot, and vaporize the extra tissue. This information is not intended to replace advice given to you by your health care provider. Make sure you discuss any questions you have with your health care provider. Document Released: 10/12/2007 Document Revised: 07/24/2016 Document Reviewed: 07/24/2016 Elsevier Interactive Patient Education  2017 [place] [facility] Prostate laser surgery is a procedure to eliminate prostate tissue. The surgery is performed with an instrument that uses a beam of light (laser). The procedure may be done when a man experiences complications from an enlarged prostate gland, such as:  Inability to urinate.  Recurring urinary tract infections.  Urinary bladder stones.  There are two types of prostate laser surgeries:  Ablation. In this  type a laser is used to vaporize prostate tissue.  Enucleation. In this type a laser is used to cut out prostate tissue.  Tell a health care provider about:  Any allergies you have.  All medicines you are taking, including vitamins, herbs, eye drops, creams, and over-the-counter medicines.  Any problems you or family members have had with anesthetic medicines.  Any blood disorders you have.  Any surgeries you have had.  Any medical conditions you have. What are the risks? Generally, this is a safe procedure. However, problems may occur, including:  Bleeding and the need for a blood transfusion.  Urinary tract infection.  Erectile dysfunction.  Narrowing of the urethra, which blocks the flow of urine.  Dry ejaculation.  What happens before the procedure? Staying hydrated Follow instructions from your health care provider about hydration, which may include:  Up to 2 hours before the procedure - you may continue to drink clear liquids, such as water, clear fruit juice, black coffee, and plain tea.  Eating and drinking restrictions Follow instructions from your health care provider about eating and drinking, which may include:  8 hours before the procedure - stop eating heavy meals or foods such as meat, fried foods, or fatty foods.  6 hours before the procedure - stop eating light meals or foods, such as toast or cereal.  6 hours before the procedure - stop drinking milk or drinks that contain milk.  2 hours before the procedure - stop drinking clear liquids.  Medicines Ask your health care provider about:  Changing or stopping your regular medicines. This is especially important if you are taking diabetes medicines or blood thinners.  Taking medicines such as aspirin and ibuprofen. These medicines can thin your blood. Do not take these medicines before  your procedure if your health care provider instructs you not to.  General instructions  Plan to have someone  take you home from the hospital or clinic.  If you will be going home right after the procedure, plan to have someone with you for 24 hours. What happens during the procedure?  To reduce your risk of infection: ? Your health care team will wash or sanitize their hands. ? Your skin will be washed with soap. ? Hair may be removed from the surgical area. ? You may be given an antibiotic medicine.  An IV tube will be inserted into one of your veins.  You will be given one or more of the following: ? A medicine to help you relax (sedative). ? A medicine to numb the area (local anesthetic). ? A medicine that is injected into your spine to numb the area below and slightly above the injection site (spinal anesthetic). ? A medicine to make you fall asleep (general anesthetic).  An instrument (laser) will be inserted through the penis into the urethra, and a viewing scope and other instruments will be placed inside of the urethra.  Prostate tissue will either be vaporized or cut away and removed.  The laser beam will be used to stop any bleeding.  A thin, flexible tube (urinary catheter) will be inserted into your bladder to drain urine to the outside of your body. The procedure may vary among health care providers and hospitals. What happens after the procedure?  Your blood pressure, heart rate, breathing rate, and blood oxygen level will be monitored until the medicines you were given have worn off.  You will be given fluids through the IV tube. The IV tube will be removed when you are eating and drinking appropriately.  The urinary catheter will be removed as soon as possible. It may need to be in place for up to 24 hours.  Depending on your specific needs, you may be admitted to the hospital or you will be sent home after the procedure. If you are sent home: ? Do not drive for 24 hours if you were given a sedative. ? You may be given elastic support stockings to help prevent blood  clots in your legs. ? You may be given a stool softener. Summary  Prostate laser surgery is a procedure that is used to treat an enlarged prostate gland.  The procedure involves inserting a scope and laser into the urethra to reach the prostate gland.  You may need to have a urinary catheter in place immediately after the surgery for up to 24 hours.  If you will be going home right after the procedure, plan to have someone with you for 24 hours. This information is not intended to replace advice given to you by your health care provider. Make sure you discuss any questions you have with your health care provider. Document Released: 07/05/2005 Document Revised: 02/20/2016 Document Reviewed: 02/20/2016 Elsevier Interactive Patient Education  2018 [employer].   Transurethral Vaporization of Prostate, Care After This sheet gives you information about how to care for yourself after your procedure. Your health care provider may also give you more specific instructions. If you have problems or questions, contact your health care provider. What can I expect after the procedure? After the procedure, it is common to have:  Pain or discomfort around your penis.  Blood in your urine.  Burning during urination.  Sudden urges to urinate.  Little or no semen produced during ejaculation (retrograde ejaculation).  Follow these instructions at home: Medicines  Take over-the-counter and prescription medicines only as told by your health care provider.  If you were prescribed an antibiotic medicine, take it as told by your health care provider. Do not stop taking the antibiotic even if you start to feel better. Activity  Do not drive for 24 hours if you were given a medicine to help you relax (sedative) during your procedure.  Do not drive or use heavy machinery while taking prescription pain medicine.  Do not resume sexual activity until your health care provider approves. Ask your health  care provider: ? What activities are safe for you. ? When you may return to your normal activities.  Do not lift anything that is heavier than 10 lb (4.5 kg), or the limit that you are told, until your health care provider says that it is safe. General instructions  Drink enough fluid to keep your urine clear or pale yellow.  To prevent or treat constipation while you are taking prescription pain medicine, your health care provider may recommend that you: ? Take over-the-counter or prescription medicines. ? Eat foods that are high in fiber, such as fresh fruits and vegetables, whole grains, and beans. ? Limit foods that are high in fat and processed sugars, such as fried and sweet foods.  If you go home with a tube draining your urine (urinary catheter), care for your catheter as told by your health care provider. This may include: ? Washing your hands before and after you touch the catheter. ? Keeping the area around the catheter clean and dry. ? Emptying the catheter bag when it is full and monitoring the amount and color of your urine. ? Avoiding bending or breaking the catheter. ? Keeping air out of the catheter. ? Not putting the catheter underwater. ? Visiting your health care provider to have the catheter removed.  Keep all follow-up visits as told by your health care provider. This is important. Contact a health care provider if:  You have: ? A fever or chills. ? Trouble urinating or controlling when you urinate. ? More blood in your urine instead of less. ? Problems getting an erection.  You start to pass blood clots in your urine.  You have nausea or you vomit.  There is more redness, swelling, or pain in your penis. Get help right away if:  You have bright red urine.  You cannot urinate.  You have severe pain that does not get better with medicine.  You have shortness of breath. Summary  After this procedure, it is common to have some blood in your urine. If  you see bright red blood in your urine, however, you should get medical help right away.  This procedure may cause you to produce little or no semen when ejaculating (retrograde ejaculation).  Follow restrictions about lifting and sexual activity as told by your health care provider. Ask what activities are safe for you.  Drink enough fluid to keep your urine clear or pale yellow. This information is not intended to replace advice given to you by your health care provider. Make sure you discuss any questions you have with your health care provider. Document Released: 09/15/2016 Document Revised: 09/15/2016 Document Reviewed: 09/15/2016 Elsevier Interactive Patient Education  2018 [facility] INSTRUCTIONS   5) The drugs that you were given will stay in your system until tomorrow so for the next 24 hours you should not:  D) Drive an automobile  E) Make any legal decisions F) Drink any alcoholic beverage   6) You may resume regular meals tomorrow.  Today it is better to start with liquids and gradually work up to solid foods.  You may eat anything you prefer, but it is better to start with liquids, then soup and crackers, and gradually work up to solid foods.   7) Please notify your doctor immediately if you have any unusual bleeding, trouble breathing, redness and pain at the surgery site, drainage, fever, or pain not relieved by medication.    8) Additional Instructions:        Please contact your physician with any problems or Same Day Surgery at [phone], Monday through Friday 6 am to 4 pm, or [place] at [facility] number at [phone].

## 2018-06-20 NOTE — Transfer of Care (Signed)
Immediate Anesthesia Transfer of Care Note  Patient: Jeffery [name]  Procedure(s) Performed: Procedure(s): GREEN LIGHT LASER TURP (TRANSURETHRAL RESECTION OF PROSTATE (N/A)  Patient Location: PACU  Anesthesia Type:General  Level of Consciousness: sedated  Airway & Oxygen Therapy: Patient Spontanous Breathing and Patient connected to face mask oxygen  Post-op Assessment: Report given to RN and Post -op Vital signs reviewed and stable  Post vital signs: Reviewed and stable  Last Vitals:  Vitals:   06/20/18 1205 06/20/18 1600  BP: (!) 163/76 118/69  Pulse: 71 91  Resp: 18 13  Temp: (!) 36.2 C (!) 36.2 C  SpO2: 91% 91%    Complications: No apparent anesthesia complications

## 2018-06-20 NOTE — H&P (Signed)
Date of Initial H&P: 06/13/18  History reviewed, patient examined, no change in status, stable for surgery.

## 2018-06-20 NOTE — Anesthesia Preprocedure Evaluation (Addendum)
Anesthesia Evaluation  Patient identified by MRN, date of birth, ID band Patient awake    Reviewed: Allergy & Precautions, H&P , NPO status , Patient's Chart, lab work & pertinent test results  Airway Mallampati: III  TM Distance: >3 FB     Dental  (+) Chipped   Pulmonary former smoker,           Cardiovascular + Valvular Problems/Murmurs (murmur)   Echo October 2019: NORMAL LEFT VENTRICULAR SYSTOLIC FUNCTION NORMAL RIGHT VENTRICULAR SYSTOLIC FUNCTION Morphology: MODERATELY THICKENED Closest EF: >55% (Estimated) AVS: MODERATE AS Mitral: TRIVIAL MR Tricuspid: MILD TR   Neuro/Psych negative neurological ROS  negative psych ROS   GI/Hepatic negative GI ROS, Neg liver ROS,   Endo/Other  negative endocrine ROS  Renal/GU      Musculoskeletal   Abdominal   Peds  Hematology negative hematology ROS (+)   Anesthesia Other Findings Past Medical History: No date: Does use hearing aid 06/03/2016: Empyema lung ([place])     Comment:  MODERATE WBC PRESENT, PREDOMINANTLY PMN, STREP               INTERMEDIUS No date: Hyperlipidemia Nov/Dec 2017: Pneumonia  Past Surgical History: 1971: APPENDECTOMY 2014: COLONOSCOPY No date: EYE SURGERY     Comment:  cataract surgery  2008: HERNIA REPAIR; Left 12/01/2016: INGUINAL HERNIA REPAIR; Right     Comment:  Right inguinal hernia repair with medium Ultra Pro mesh. 12/01/2016: INSERTION OF MESH; Right     Comment:  Procedure: INSERTION OF MESH;  Surgeon: Robert Bellow, MD;  Location: [facility] ORS;  Service: General;                Laterality: Right; 1944: TONSILLECTOMY     Reproductive/Obstetrics negative OB ROS                            Anesthesia Physical Anesthesia Plan  ASA: II  Anesthesia Plan: General ETT   Post-op Pain Management:    Induction:   PONV Risk Score and Plan: Ondansetron, Treatment may vary due to age or medical  condition and Dexamethasone  Airway Management Planned:   Additional Equipment:   Intra-op Plan:   Post-operative Plan:   Informed Consent: I have reviewed the patients History and Physical, chart, labs and discussed the procedure including the risks, benefits and alternatives for the proposed anesthesia with the patient or authorized representative who has indicated his/her understanding and acceptance.   Dental Advisory Given  Plan Discussed with: Anesthesiologist  Anesthesia Plan Comments:        Anesthesia Quick Evaluation

## 2018-06-20 NOTE — Op Note (Signed)
Preoperative diagnosis: 1.  BPH with bladder outlet obstruction                                             2.  Prostate cancer  Postoperative diagnosis: Same  Procedure: Photo vaporization of the prostate with greenlight laser  Surgeon: Otelia Limes. Yves Dill MD  Anesthesia: General  Indications:See the history and physical. After informed consent the above procedure(s) were requested     Technique and findings: After adequate general anesthesia had been obtained the patient was placed into dorsal lithotomy position and the perineum was prepped and draped in the usual fashion.  Urethra was dilated up to 26 [place] with the [employer].  The laser scope was then coupled with the camera and visually advanced into the bladder.  The bladder was moderately trabeculated.  No bladder tumors were identified.  Ureteral orifice ease were identified and had clear efflux.  The patient had trilobar BPH with intravesical growth of a posterior median lobe.  This point the XPS greenlight laser fiber was introduced to the scope and set at 47 W of power.  Median lobe tissue was vaporized.  The bladder neck tissue was vaporized.  The power was increased to 120 W and remaining tissue from the bladder neck to the verumontanum was vaporized.  The power was further increased to 180 W and remaining obstructive tissue vaporized.  Bleeders were controlled with the coagulation setting.  The laser scope was removed and 10 cc of viscous Xylocaine instilled within the urethra.  24 French silicone catheter was placed and irrigated until clear.  A B&O suppository was placed.  The procedure was then terminated and patient transferred to the recovery room in stable condition.  Estimated blood loss was 75 cc.

## 2018-06-21 ENCOUNTER — Encounter: Payer: Self-pay | Admitting: Urology

## 2018-06-21 NOTE — Anesthesia Postprocedure Evaluation (Signed)
Anesthesia Post Note  Patient: JAUAN WOHL  Procedure(s) Performed: GREEN LIGHT LASER TURP (TRANSURETHRAL RESECTION OF PROSTATE (N/A Prostate)  Patient location during evaluation: PACU Anesthesia Type: General Level of consciousness: awake and alert and oriented Pain management: pain level controlled Vital Signs Assessment: post-procedure vital signs reviewed and stable Respiratory status: spontaneous breathing, nonlabored ventilation and respiratory function stable Cardiovascular status: blood pressure returned to baseline and stable Postop Assessment: no signs of nausea or vomiting Anesthetic complications: no     Last Vitals:  Vitals:   06/20/18 1715 06/20/18 1728  BP: 128/74 (!) 143/76  Pulse: 73 78  Resp: 14 16  Temp: (!) 36.3 C (!) 36.3 C  SpO2: 98% 97%    Last Pain:  Vitals:   06/21/18 0848  TempSrc:   PainSc: 0-No pain                 Fabiana Dromgoole

## 2018-07-12 ENCOUNTER — Encounter
Admission: EM | Disposition: A | Discharge status: Home Health | Payer: Self-pay | Source: Home / Self Care | Attending: Surgery

## 2018-07-12 ENCOUNTER — Emergency Department: Payer: Medicare Other | Admitting: Certified Registered"

## 2018-07-12 ENCOUNTER — Inpatient Hospital Stay
Admission: EM | Admit: 2018-07-12 | Discharge: 2018-07-17 | DRG: 350 | Disposition: A | Discharge status: Home Health | Payer: Medicare Other | Attending: Surgery | Admitting: Surgery

## 2018-07-12 ENCOUNTER — Emergency Department: Payer: Medicare Other

## 2018-07-12 ENCOUNTER — Other Ambulatory Visit: Payer: Self-pay

## 2018-07-12 DIAGNOSIS — Z8269 Family history of other diseases of the musculoskeletal system and connective tissue: Secondary | ICD-10-CM

## 2018-07-12 DIAGNOSIS — N138 Other obstructive and reflux uropathy: Secondary | ICD-10-CM | POA: Diagnosis present

## 2018-07-12 DIAGNOSIS — Z8249 Family history of ischemic heart disease and other diseases of the circulatory system: Secondary | ICD-10-CM | POA: Diagnosis not present

## 2018-07-12 DIAGNOSIS — F05 Delirium due to known physiological condition: Secondary | ICD-10-CM | POA: Diagnosis not present

## 2018-07-12 DIAGNOSIS — Z87891 Personal history of nicotine dependence: Secondary | ICD-10-CM | POA: Diagnosis not present

## 2018-07-12 DIAGNOSIS — K56609 Unspecified intestinal obstruction, unspecified as to partial versus complete obstruction: Secondary | ICD-10-CM

## 2018-07-12 DIAGNOSIS — K4031 Unilateral inguinal hernia, with obstruction, without gangrene, recurrent: Secondary | ICD-10-CM | POA: Diagnosis present

## 2018-07-12 DIAGNOSIS — Z9089 Acquired absence of other organs: Secondary | ICD-10-CM

## 2018-07-12 DIAGNOSIS — Z9079 Acquired absence of other genital organ(s): Secondary | ICD-10-CM

## 2018-07-12 DIAGNOSIS — G9341 Metabolic encephalopathy: Secondary | ICD-10-CM | POA: Diagnosis not present

## 2018-07-12 DIAGNOSIS — Z79899 Other long term (current) drug therapy: Secondary | ICD-10-CM | POA: Diagnosis not present

## 2018-07-12 DIAGNOSIS — B9561 Methicillin susceptible Staphylococcus aureus infection as the cause of diseases classified elsewhere: Secondary | ICD-10-CM | POA: Diagnosis present

## 2018-07-12 DIAGNOSIS — K4090 Unilateral inguinal hernia, without obstruction or gangrene, not specified as recurrent: Secondary | ICD-10-CM | POA: Diagnosis not present

## 2018-07-12 DIAGNOSIS — N401 Enlarged prostate with lower urinary tract symptoms: Secondary | ICD-10-CM | POA: Diagnosis present

## 2018-07-12 DIAGNOSIS — K4030 Unilateral inguinal hernia, with obstruction, without gangrene, not specified as recurrent: Secondary | ICD-10-CM | POA: Diagnosis present

## 2018-07-12 DIAGNOSIS — F0390 Unspecified dementia without behavioral disturbance: Secondary | ICD-10-CM | POA: Diagnosis present

## 2018-07-12 DIAGNOSIS — E785 Hyperlipidemia, unspecified: Secondary | ICD-10-CM | POA: Diagnosis present

## 2018-07-12 DIAGNOSIS — N390 Urinary tract infection, site not specified: Secondary | ICD-10-CM | POA: Diagnosis present

## 2018-07-12 DIAGNOSIS — Z9049 Acquired absence of other specified parts of digestive tract: Secondary | ICD-10-CM | POA: Diagnosis not present

## 2018-07-12 DIAGNOSIS — R112 Nausea with vomiting, unspecified: Secondary | ICD-10-CM | POA: Diagnosis present

## 2018-07-12 DIAGNOSIS — C61 Malignant neoplasm of prostate: Secondary | ICD-10-CM | POA: Diagnosis present

## 2018-07-12 HISTORY — PX: INGUINAL HERNIA REPAIR: SHX194

## 2018-07-12 LAB — CBC WITH DIFFERENTIAL/PLATELET
ABS IMMATURE GRANULOCYTES: 0.07 10*3/uL (ref 0.00–0.07)
Basophils Absolute: 0 10*3/uL (ref 0.0–0.1)
Basophils Relative: 0 %
Eosinophils Absolute: 0 10*3/uL (ref 0.0–0.5)
Eosinophils Relative: 0 %
HCT: 41.3 % (ref 39.0–52.0)
HEMOGLOBIN: 13.3 g/dL (ref 13.0–17.0)
Immature Granulocytes: 1 %
LYMPHS ABS: 0.7 10*3/uL (ref 0.7–4.0)
LYMPHS PCT: 5 %
MCH: 29.2 pg (ref 26.0–34.0)
MCHC: 32.2 g/dL (ref 30.0–36.0)
MCV: 90.8 fL (ref 80.0–100.0)
Monocytes Absolute: 1 10*3/uL (ref 0.1–1.0)
Monocytes Relative: 7 %
Neutro Abs: 13.5 10*3/uL — ABNORMAL HIGH (ref 1.7–7.7)
Neutrophils Relative %: 87 %
Platelets: 283 10*3/uL (ref 150–400)
RBC: 4.55 MIL/uL (ref 4.22–5.81)
RDW: 13 % (ref 11.5–15.5)
WBC: 15.4 10*3/uL — ABNORMAL HIGH (ref 4.0–10.5)
nRBC: 0 % (ref 0.0–0.2)

## 2018-07-12 LAB — URINALYSIS, COMPLETE (UACMP) WITH MICROSCOPIC
Bilirubin Urine: NEGATIVE
GLUCOSE, UA: NEGATIVE mg/dL
Ketones, ur: 5 mg/dL — AB
NITRITE: POSITIVE — AB
Protein, ur: >=300 mg/dL — AB
RBC / HPF: >50 RBC/hpf — ABNORMAL HIGH (ref 0–5)
Specific Gravity, Urine: 1.026 (ref 1.005–1.030)
WBC, UA: >50 WBC/hpf — ABNORMAL HIGH (ref 0–5)
pH: 8 (ref 5.0–8.0)

## 2018-07-12 LAB — COMPREHENSIVE METABOLIC PANEL
ALT: 11 U/L (ref 0–44)
AST: 14 U/L — ABNORMAL LOW (ref 15–41)
Albumin: 4.1 g/dL (ref 3.5–5.0)
Alkaline Phosphatase: 79 U/L (ref 38–126)
Anion gap: 6 (ref 5–15)
BUN: 21 mg/dL (ref 8–23)
CHLORIDE: 104 mmol/L (ref 98–111)
CO2: 29 mmol/L (ref 22–32)
Calcium: 9 mg/dL (ref 8.9–10.3)
Creatinine, Ser: 0.81 mg/dL (ref 0.61–1.24)
GFR calc Af Amer: >60 mL/min (ref >60)
GFR calc non Af Amer: >60 mL/min (ref >60)
Glucose, Bld: 152 mg/dL — ABNORMAL HIGH (ref 70–99)
Potassium: 3.9 mmol/L (ref 3.5–5.1)
SODIUM: 139 mmol/L (ref 135–145)
Total Bilirubin: 1 mg/dL (ref 0.3–1.2)
Total Protein: 6.8 g/dL (ref 6.5–8.1)

## 2018-07-12 LAB — INFLUENZA PANEL BY PCR (TYPE A & B)
Influenza A By PCR: NEGATIVE
Influenza B By PCR: NEGATIVE

## 2018-07-12 LAB — LIPASE, BLOOD: Lipase: 28 U/L (ref 11–51)

## 2018-07-12 SURGERY — REPAIR, HERNIA, INGUINAL, ADULT
Anesthesia: General | Site: Abdomen | Laterality: Left

## 2018-07-12 MED ORDER — ROCURONIUM BROMIDE 100 MG/10ML IV SOLN
INTRAVENOUS | Status: DC | PRN
  Administered 2018-07-12: 10 mg via INTRAVENOUS
  Administered 2018-07-12: 5 mg via INTRAVENOUS
  Administered 2018-07-12: 30 mg via INTRAVENOUS

## 2018-07-12 MED ORDER — ROCURONIUM BROMIDE 50 MG/5ML IV SOLN
INTRAVENOUS | Status: AC
  Filled 2018-07-12: qty 1

## 2018-07-12 MED ORDER — ONDANSETRON HCL 4 MG/2ML IJ SOLN
INTRAMUSCULAR | Status: AC
  Filled 2018-07-12: qty 2

## 2018-07-12 MED ORDER — ONDANSETRON HCL 4 MG/2ML IJ SOLN: 4.0000 mg | Freq: Once | INTRAMUSCULAR | Status: DC | PRN

## 2018-07-12 MED ORDER — PIPERACILLIN-TAZOBACTAM 3.375 G IVPB 30 MIN
3.3750 g | Freq: Once | INTRAVENOUS | Status: AC
  Administered 2018-07-12: 3.375 g via INTRAVENOUS
  Filled 2018-07-12: qty 50

## 2018-07-12 MED ORDER — FENTANYL CITRATE (PF) 100 MCG/2ML IJ SOLN: 25.0000 ug | INTRAMUSCULAR | Status: DC | PRN

## 2018-07-12 MED ORDER — ONDANSETRON HCL 4 MG/2ML IJ SOLN
4.0000 mg | Freq: Once | INTRAMUSCULAR | Status: AC
  Administered 2018-07-12: 4 mg via INTRAVENOUS
  Filled 2018-07-12: qty 2

## 2018-07-12 MED ORDER — PROPOFOL 10 MG/ML IV BOLUS
INTRAVENOUS | Status: AC
  Filled 2018-07-12: qty 20

## 2018-07-12 MED ORDER — LACTATED RINGERS IV SOLN
INTRAVENOUS | Status: DC | PRN
  Administered 2018-07-12: 22:00:00 via INTRAVENOUS

## 2018-07-12 MED ORDER — BUPIVACAINE LIPOSOME 1.3 % IJ SUSP
INTRAMUSCULAR | Status: DC | PRN
  Administered 2018-07-12: 20 mL

## 2018-07-12 MED ORDER — DEXAMETHASONE SODIUM PHOSPHATE 10 MG/ML IJ SOLN
INTRAMUSCULAR | Status: DC | PRN
  Administered 2018-07-12: 8 mg via INTRAVENOUS

## 2018-07-12 MED ORDER — SUCCINYLCHOLINE CHLORIDE 20 MG/ML IJ SOLN
INTRAMUSCULAR | Status: AC
  Filled 2018-07-12: qty 1

## 2018-07-12 MED ORDER — HYDROMORPHONE HCL 1 MG/ML IJ SOLN: 0.2500 mg | INTRAMUSCULAR | Status: DC | PRN

## 2018-07-12 MED ORDER — DIPHENHYDRAMINE HCL 50 MG/ML IJ SOLN: 12.5000 mg | Freq: Once | INTRAMUSCULAR | Status: DC

## 2018-07-12 MED ORDER — PROPOFOL 10 MG/ML IV BOLUS
INTRAVENOUS | Status: DC | PRN
  Administered 2018-07-12: 140 mg via INTRAVENOUS

## 2018-07-12 MED ORDER — LIDOCAINE HCL (CARDIAC) PF 100 MG/5ML IV SOSY
PREFILLED_SYRINGE | INTRAVENOUS | Status: DC | PRN
  Administered 2018-07-12: 60 mg via INTRAVENOUS

## 2018-07-12 MED ORDER — BUPIVACAINE-EPINEPHRINE (PF) 0.25% -1:200000 IJ SOLN
INTRAMUSCULAR | Status: DC | PRN
  Administered 2018-07-12: 30 mL via PERINEURAL

## 2018-07-12 MED ORDER — FENTANYL CITRATE (PF) 100 MCG/2ML IJ SOLN
INTRAMUSCULAR | Status: DC | PRN
  Administered 2018-07-12 (×2): 50 ug via INTRAVENOUS

## 2018-07-12 MED ORDER — SUGAMMADEX SODIUM 200 MG/2ML IV SOLN
INTRAVENOUS | Status: DC | PRN
  Administered 2018-07-12: 140 mg via INTRAVENOUS

## 2018-07-12 MED ORDER — EPHEDRINE SULFATE 50 MG/ML IJ SOLN
INTRAMUSCULAR | Status: AC
  Filled 2018-07-12: qty 1

## 2018-07-12 MED ORDER — PHENYLEPHRINE HCL 10 MG/ML IJ SOLN
INTRAMUSCULAR | Status: DC | PRN
  Administered 2018-07-12: 100 ug via INTRAVENOUS
  Administered 2018-07-12 (×2): 200 ug via INTRAVENOUS

## 2018-07-12 MED ORDER — SODIUM CHLORIDE 0.9 % IV BOLUS
1000.0000 mL | Freq: Once | INTRAVENOUS | Status: AC
  Administered 2018-07-12: 1000 mL via INTRAVENOUS

## 2018-07-12 MED ORDER — DEXAMETHASONE SODIUM PHOSPHATE 10 MG/ML IJ SOLN
INTRAMUSCULAR | Status: AC
  Filled 2018-07-12: qty 1

## 2018-07-12 MED ORDER — LIDOCAINE HCL (PF) 2 % IJ SOLN
INTRAMUSCULAR | Status: AC
  Filled 2018-07-12: qty 10

## 2018-07-12 MED ORDER — ONDANSETRON HCL 4 MG/2ML IJ SOLN
INTRAMUSCULAR | Status: DC | PRN
  Administered 2018-07-12: 4 mg via INTRAVENOUS

## 2018-07-12 MED ORDER — SUCCINYLCHOLINE CHLORIDE 20 MG/ML IJ SOLN
INTRAMUSCULAR | Status: DC | PRN
  Administered 2018-07-12: 100 mg via INTRAVENOUS

## 2018-07-12 MED ORDER — FENTANYL CITRATE (PF) 100 MCG/2ML IJ SOLN
INTRAMUSCULAR | Status: AC
  Filled 2018-07-12: qty 2

## 2018-07-12 SURGICAL SUPPLY — 31 items
APPLIER CLIP 11 MED OPEN (CLIP) ×3
BLADE CLIPPER SURG (BLADE) ×3 IMPLANT
BLADE SURG 15 STRL LF DISP TIS (BLADE) ×1 IMPLANT
BLADE SURG 15 STRL SS (BLADE) ×2
CANISTER SUCT 1200ML W/VALVE (MISCELLANEOUS) ×3 IMPLANT
CHLORAPREP W/TINT 26ML (MISCELLANEOUS) ×3 IMPLANT
CLIP APPLIE 11 MED OPEN (CLIP) ×1 IMPLANT
COVER WAND RF STERILE (DRAPES) ×3 IMPLANT
DERMABOND ADVANCED (GAUZE/BANDAGES/DRESSINGS) ×2
DERMABOND ADVANCED .7 DNX12 (GAUZE/BANDAGES/DRESSINGS) ×1 IMPLANT
DRAIN PENROSE 5/8X18 LTX STRL (WOUND CARE) ×3 IMPLANT
DRAPE INCISE IOBAN 66X45 STRL (DRAPES) ×3 IMPLANT
DRAPE LAPAROTOMY 77X122 PED (DRAPES) ×3 IMPLANT
DRSG TELFA 3X8 NADH (GAUZE/BANDAGES/DRESSINGS) ×3 IMPLANT
ELECT CAUTERY BLADE 6.4 (BLADE) ×3 IMPLANT
ELECT REM PT RETURN 9FT ADLT (ELECTROSURGICAL) ×3
ELECTRODE REM PT RTRN 9FT ADLT (ELECTROSURGICAL) ×1 IMPLANT
GLOVE BIO SURGEON STRL SZ7 (GLOVE) ×3 IMPLANT
GOWN STRL REUS W/ TWL LRG LVL3 (GOWN DISPOSABLE) ×2 IMPLANT
GOWN STRL REUS W/TWL LRG LVL3 (GOWN DISPOSABLE) ×4
NEEDLE HYPO 22GX1.5 SAFETY (NEEDLE) ×3 IMPLANT
NS IRRIG 500ML POUR BTL (IV SOLUTION) ×3 IMPLANT
PACK BASIN MINOR ARMC (MISCELLANEOUS) ×3 IMPLANT
SLEEVE SCD COMPRESS THIGH MED (MISCELLANEOUS) ×3 IMPLANT
SPONGE KITTNER 5P (MISCELLANEOUS) ×3 IMPLANT
SPONGE LAP 18X18 RF (DISPOSABLE) ×3 IMPLANT
SUT ETHIBOND NAB MO 7 #0 18IN (SUTURE) ×6 IMPLANT
SUT MNCRL AB 4-0 PS2 18 (SUTURE) ×3 IMPLANT
SUT VIC AB 3-0 SH 27 (SUTURE) ×2
SUT VIC AB 3-0 SH 27X BRD (SUTURE) ×1 IMPLANT
SYR 20CC LL (SYRINGE) ×6 IMPLANT

## 2018-07-12 NOTE — ED Notes (Signed)
Pt off the unit with surgeon to the OR. Report has been called to [place], Therapist, sports

## 2018-07-12 NOTE — H&P (Signed)
Patient ID: Jeffery [name], male   DOB: 1930-07-31, 84 y.o.   MRN: [id number]  HPI Jeffery [name] is a 84 y.o. male seen in the ER for Nausea and vomiting and some Left inguinal pain. Started 2 days ago.  Pain is sharp and constant, moderate in intensity worsening with valsalva. HE is in very good shape and is able to perform more than 4 METS w/o SOB or C/p. Appendectomy and Right IH repair in the past.  Ct scan pers. Reviewed showing SBO w transition point at the left [facility] w incarceration. WBC 15 hb 13 , cmp is completely nml.  HPI  Past Medical History:  Diagnosis Date  . Does use hearing aid   . Empyema lung ([place]) 06/03/2016   MODERATE WBC PRESENT, PREDOMINANTLY PMN, STREP INTERMEDIUS  . Hyperlipidemia   . Pneumonia Nov/Dec 2017    Past Surgical History:  Procedure Laterality Date  . APPENDECTOMY  1971  . COLONOSCOPY  2014  . EYE SURGERY     cataract surgery   . GREEN LIGHT LASER TURP (TRANSURETHRAL RESECTION OF PROSTATE N/A 06/20/2018   Procedure: GREEN LIGHT LASER TURP (TRANSURETHRAL RESECTION OF PROSTATE;  Surgeon: Royston Cowper, MD;  Location: [facility] ORS;  Service: Urology;  Laterality: N/A;  . HERNIA REPAIR Left 2008  . INGUINAL HERNIA REPAIR Right 12/01/2016   Right inguinal hernia repair with medium Ultra Pro mesh.  . INSERTION OF MESH Right 12/01/2016   Procedure: INSERTION OF MESH;  Surgeon: Robert Bellow, MD;  Location: [facility] ORS;  Service: General;  Laterality: Right;  . TONSILLECTOMY  1944    Family History  Problem Relation Age of Onset  . Heart attack Father   . ALS Sister   . Cancer Sister   . ALS Sister     Social History Social History   Tobacco Use  . Smoking status: Former Smoker    Packs/day: 1.00    Years: 10.00    Pack years: 10.00    Types: Cigarettes  . Smokeless tobacco: Never Used  . Tobacco comment: quit smoking in 1970  Substance Use Topics  . Alcohol use: Yes    Comment: occ 3/week  . Drug use: No    No Known Allergies  No  current facility-administered medications for this encounter.    Current Outpatient Medications  Medication Sig Dispense Refill  . acetaminophen-codeine (TYLENOL #3) 300-30 MG tablet Take 1-2 tablets by mouth every 4 (four) hours as needed for moderate pain. 30 tablet 1  . B Complex-Biotin-FA (SUPER B-50 PO) Take 1 capsule by mouth every Monday, Wednesday, and Friday.     . brimonidine-timolol (COMBIGAN) 0.2-0.5 % ophthalmic solution Place 1 drop into both eyes every 12 (twelve) hours.    . ciprofloxacin (CIPRO) 500 MG tablet Take 1 tablet (500 mg total) by mouth 2 (two) times daily. 6 tablet 0  . docusate sodium (COLACE) 100 MG capsule Take 2 capsules (200 mg total) by mouth 2 (two) times daily. 120 capsule 3  . donepezil (ARICEPT) 10 MG tablet Take 10 mg by mouth at bedtime.    . Glucosamine-Chondroitin 500-250 MG CAPS Take 1 capsule by mouth every Monday, Wednesday, and Friday.     . Meth-Hyo-M Bl-Na Phos-Ph Sal (URIBEL) 118 MG CAPS Take 1 capsule (118 mg total) by mouth every 6 (six) hours as needed (dysuria). 40 capsule 3  . Multiple Vitamins-Minerals (SUPER ANTIOXIDANT PO) Take 1 tablet by mouth every Monday, Wednesday, and Friday.     Marland Kitchen  Omega-3 Fatty Acids (FISH OIL) 1000 MG CAPS Take 1,000 mg by mouth 4 (four) times a week.     . tamsulosin (FLOMAX) 0.4 MG CAPS capsule Take 0.4 mg by mouth daily after supper.       Review of Systems Full ROS  was asked and was negative except for the information on the HPI  Physical Exam Blood pressure 127/77, pulse 93, temperature 99.4 F (37.4 C), temperature source Oral, resp. rate 20, height 5\' 10"  (1.778 m), weight 68 kg, SpO2 96 %. CONSTITUTIONAL: NAD EYES: Pupils are equal, round, and reactive to light, Sclera are non-icteric. EARS, NOSE, MOUTH AND THROAT: The oropharynx is clear. The oral mucosa is pink and moist. Hearing is intact to voice. LYMPH NODES:  Lymph nodes in the neck are normal. RESPIRATORY:  Lungs are clear. There is normal  respiratory effort, with equal breath sounds bilaterally, and without pathologic use of accessory muscles. CARDIOVASCULAR: Heart is regular without murmurs, gallops, or rubs. GI: The abdomen is  soft, exquisitely tender Left inguinal hernia, incarcerated. No overt peritoneal sings. MUSCULOSKELETAL: Normal muscle strength and tone. No cyanosis or edema.   SKIN: Turgor is good and there are no pathologic skin lesions or ulcers. NEUROLOGIC: Motor and sensation is grossly normal. Cranial nerves are grossly intact. PSYCH:  Oriented to person, place and time. Affect is normal.  Data Reviewed  I have personally reviewed the patient's imaging, laboratory findings and medical records.    Assessment/Plan 84 yo male with incarcerated Left Inguinal hernia and SBO in need for emergent repair. D/W the pt in detail about the procedure. Risks, benefits and possible complications including but not limited to: bleeding, infection, recurrence, ileus and chronic pain.  We will start BOlus of crystalloid, NGT and post him for repair tonight. D/W RN and anesthesiologist in detail.      Caroleen Hamman, MD FACS General Surgeon 07/12/2018, 9:42 PM

## 2018-07-12 NOTE — Transfer of Care (Signed)
Immediate Anesthesia Transfer of Care Note  Patient: Jeffery [name]  Procedure(s) Performed: HERNIA REPAIR INGUINAL ADULT (Left Abdomen)  Patient Location: PACU  Anesthesia Type:General  Level of Consciousness: awake and responds to stimulation  Airway & Oxygen Therapy: Patient Spontanous Breathing and Patient connected to face mask oxygen  Post-op Assessment: Report given to RN and Post -op Vital signs reviewed and stable  Post vital signs: Reviewed and stable  Last Vitals:  Vitals Value Taken Time  BP 137/61 07/12/2018 10:52 PM  Temp    Pulse    Resp 14 07/12/2018 10:52 PM  SpO2 99 % 07/12/2018 10:52 PM    Last Pain:  Vitals:   07/12/18 1953  TempSrc:   PainSc: 0-No pain         Complications: No apparent anesthesia complications

## 2018-07-12 NOTE — ED Notes (Signed)
Introduced to patient, initial assessment completed. Family at bedside. Call bell within reach. Remains on cardiac monitor.

## 2018-07-12 NOTE — ED Triage Notes (Signed)
Pt arrives via ems from home. Ems report pt has been experiencing N/V over the last 2 days and unable to keep any food down, no temp. Pt denies any pain, diarrhea, or fever associated with pain, and states he is able to keep some water down. Pt a&o x 4 on arrival, talking and laughing. Pt received around 212mL NS and 4 mg of zofran administered by ems in route to facility.

## 2018-07-12 NOTE — ED Provider Notes (Signed)
[facility] Emergency Department Provider Note  ____________________________________________  Time seen: Approximately 8:25 PM  I have reviewed the triage vital signs and the nursing notes.   HISTORY  Chief Complaint Emesis and Nausea    HPI Jeffery [name] is a 84 y.o. male brought to the ED due to nausea vomiting and inability to tolerate oral intake for the past 2 days.  No fevers or chills, no chest pain or shortness of breath.  Complains of generalized abdominal pain that is moderate intensity nonradiating without aggravating or alleviating factors.      Past Medical History:  Diagnosis Date  . Does use hearing aid   . Empyema lung ([place]) 06/03/2016   MODERATE WBC PRESENT, PREDOMINANTLY PMN, STREP INTERMEDIUS  . Hyperlipidemia   . Pneumonia Nov/Dec 2017     Patient Active Problem List   Diagnosis Date Noted  . Right inguinal hernia 11/25/2016  . Empyema lung ([place])   . Pleural effusion on right   . Pleural effusion, bacterial   . Empyema ([place]) 06/17/2016  . Impingement syndrome of shoulder region 02/25/2016  . Shoulder pain 02/25/2016  . Subacromial impingement 02/25/2016  . Mixed hyperlipidemia 04/01/2015  . Murmur, cardiac 04/01/2015     Past Surgical History:  Procedure Laterality Date  . APPENDECTOMY  1971  . COLONOSCOPY  2014  . EYE SURGERY     cataract surgery   . GREEN LIGHT LASER TURP (TRANSURETHRAL RESECTION OF PROSTATE N/A 06/20/2018   Procedure: GREEN LIGHT LASER TURP (TRANSURETHRAL RESECTION OF PROSTATE;  Surgeon: Royston Cowper, MD;  Location: [facility] ORS;  Service: Urology;  Laterality: N/A;  . HERNIA REPAIR Left 2008  . INGUINAL HERNIA REPAIR Right 12/01/2016   Right inguinal hernia repair with medium Ultra Pro mesh.  . INSERTION OF MESH Right 12/01/2016   Procedure: INSERTION OF MESH;  Surgeon: Robert Bellow, MD;  Location: [facility] ORS;  Service: General;  Laterality: Right;  . TONSILLECTOMY  1944     Prior to  Admission medications   Medication Sig Start Date End Date Taking? Authorizing Provider  acetaminophen-codeine (TYLENOL #3) 300-30 MG tablet Take 1-2 tablets by mouth every 4 (four) hours as needed for moderate pain. 06/20/18   Royston Cowper, MD  B Complex-Biotin-FA (SUPER B-50 PO) Take 1 capsule by mouth every Monday, Wednesday, and Friday.     [provider]  brimonidine-timolol (COMBIGAN) 0.2-0.5 % ophthalmic solution Place 1 drop into both eyes every 12 (twelve) hours.    [provider]  ciprofloxacin (CIPRO) 500 MG tablet Take 1 tablet (500 mg total) by mouth 2 (two) times daily. 06/20/18   Royston Cowper, MD  docusate sodium (COLACE) 100 MG capsule Take 2 capsules (200 mg total) by mouth 2 (two) times daily. 06/20/18   Royston Cowper, MD  donepezil (ARICEPT) 10 MG tablet Take 10 mg by mouth at bedtime.    [provider]  Glucosamine-Chondroitin 500-250 MG CAPS Take 1 capsule by mouth every Monday, Wednesday, and Friday.     [provider]  Meth-Hyo-M Bl-Na Phos-Ph Sal (URIBEL) 118 MG CAPS Take 1 capsule (118 mg total) by mouth every 6 (six) hours as needed (dysuria). 06/20/18   Royston Cowper, MD  Multiple Vitamins-Minerals (SUPER ANTIOXIDANT PO) Take 1 tablet by mouth every Monday, Wednesday, and Friday.     [provider]  Omega-3 Fatty Acids (FISH OIL) 1000 MG CAPS Take 1,000 mg by mouth 4 (four) times a week.  [provider]  tamsulosin (FLOMAX) 0.4 MG CAPS capsule Take 0.4 mg by mouth daily after supper.    [provider]     Allergies Patient has no known allergies.   Family History  Problem Relation Age of Onset  . Heart attack Father   . ALS Sister   . Cancer Sister   . ALS Sister     Social History Social History   Tobacco Use  . Smoking status: Former Smoker    Packs/day: 1.00    Years: 10.00    Pack years: 10.00    Types: Cigarettes  . Smokeless tobacco: Never Used  . Tobacco comment:  quit smoking in 1970  Substance Use Topics  . Alcohol use: Yes    Comment: occ 3/week  . Drug use: No    Review of Systems  Constitutional:   No fever or chills.  ENT:   No sore throat. No rhinorrhea. Cardiovascular:   No chest pain or syncope. Respiratory:   No dyspnea or cough. Gastrointestinal:   Positive for abdominal pain and vomiting.  Reports a bowel movement yesterday.  No diarrhea or bloody stool Musculoskeletal:   Negative for focal pain or swelling All other systems reviewed and are negative except as documented above in ROS and HPI.  ____________________________________________   PHYSICAL EXAM:  VITAL SIGNS: ED Triage Vitals  Enc Vitals Group     BP 07/12/18 1845 128/82     Pulse Rate 07/12/18 1845 92     Resp 07/12/18 1847 20     Temp 07/12/18 1847 99.4 F (37.4 C)     Temp Source 07/12/18 1847 Oral     SpO2 07/12/18 1837 98 %     Weight 07/12/18 1844 150 lb (68 kg)     Height 07/12/18 1844 5\' 10"  (1.778 m)     Head Circumference --      Peak Flow --      Pain Score 07/12/18 1844 0     Pain Loc --      Pain Edu? --      Excl. in [place]? --     Vital signs reviewed, nursing assessments reviewed.   Constitutional:   Alert and oriented. Non-toxic appearance. Eyes:   Conjunctivae are normal. EOMI. PERRL. ENT      Head:   Normocephalic and atraumatic.      Nose:   No congestion/rhinnorhea.       Mouth/Throat:   Dry mucous membranes, no pharyngeal erythema. No peritonsillar mass.       Neck:   No meningismus. Full ROM. Hematological/Lymphatic/Immunilogical:   No cervical lymphadenopathy. Cardiovascular:   RRR. Symmetric bilateral radial and DP pulses.  No murmurs. Cap refill less than 2 seconds. Respiratory:   Normal respiratory effort without tachypnea/retractions. Breath sounds are clear and equal bilaterally. No wheezes/rales/rhonchi. Gastrointestinal:   Soft with diffuse upper abdominal tenderness.  Moderately distended and tympanitic. There is no CVA  tenderness.  No rebound, rigidity, or guarding.  In the left inguinal region there is a palpable subcutaneous mass that is tender. Genitourinary:   Normal Musculoskeletal:   Normal range of motion in all extremities. No joint effusions.  No lower extremity tenderness.  No edema. Neurologic:   Normal speech and language.  Motor grossly intact. No acute focal neurologic deficits are appreciated.  Skin:    Skin is warm, dry and intact. No rash noted.  No petechiae, purpura, or bullae.  ____________________________________________    LABS (pertinent positives/negatives) (all labs ordered  are listed, but only abnormal results are displayed) Labs Reviewed  COMPREHENSIVE METABOLIC PANEL - Abnormal; Notable for the following components:      Result Value   Glucose, Bld 152 (*)    AST 14 (*)    All other components within normal limits  CBC WITH DIFFERENTIAL/PLATELET - Abnormal; Notable for the following components:   WBC 15.4 (*)    Neutro Abs 13.5 (*)    All other components within normal limits  URINE CULTURE  LIPASE, BLOOD  INFLUENZA PANEL BY PCR (TYPE A & B)  URINALYSIS, COMPLETE (UACMP) WITH MICROSCOPIC   ____________________________________________   EKG  Interpreted by me Sinus rhythm rate of 89, normal axis and intervals.  Normal QRS ST segments and T waves.  ____________________________________________    RADIOLOGY  Ct Abdomen Pelvis Wo Contrast  Result Date: 07/12/2018 CLINICAL DATA:  Nausea and vomiting over last 2 days, unable to keep any food down, high grade bowel obstruction, intractable hiccups EXAM: CT ABDOMEN AND PELVIS WITHOUT CONTRAST TECHNIQUE: Multidetector CT imaging of the abdomen and pelvis was performed following the standard protocol without IV contrast. Sagittal and coronal MPR images reconstructed from axial data set. Oral contrast was not administered. COMPARISON:  None Correlation: CT chest 06/21/2016 FINDINGS: Lower chest: Mild RIGHT basilar  atelectasis. Lung bases appear hyperaerated. Minimal infiltrate in lingula. Hepatobiliary: Tiny appendix cysts. Liver otherwise unremarkable. Contracted gallbladder. Pancreas: Atrophic pancreas without obvious mass Spleen: Normal appearance Adrenals/Urinary Tract: Thickening of adrenal glands without discrete mass. 14 x 12 mm cyst posterior RIGHT kidney. Tiny nonobstructing LEFT renal calculus. Kidneys, ureters, and bladder otherwise normal appearance. Stomach/Bowel: Distended stomach containing fluid and food debris. Fluid within distal esophagus which likely reflects reflux and gastric distention. Dilated small bowel loops throughout abdomen and pelvis. Distal small bowel decompressed. Transition zone is at a small bowel loop which extends into a LEFT inguinal hernia, causing obstruction. Colon decompressed. Vascular/Lymphatic: Extensive atherosclerotic calcification aorta without aneurysm. No adenopathy. Reproductive: Prostatic enlargement, gland 5.4 x 5.3 x 5.1 cm (volume = 76 cm^3). Other: No free air or free fluid.  No additional hernias. Musculoskeletal: Degenerative disc disease changes of the thoracolumbar spine. Old appearing superior endplate height loss/compression deformity of L1 vertebral body unchanged. IMPRESSION: High-grade mid small bowel obstruction secondary to a small bowel loop within the LEFT inguinal hernia. Prostatic enlargement. Minimal infiltrate in lingula. Nonobstructing LEFT renal calculus. Electronically Signed   By: Lavonia Dana M.D.   On: 07/12/2018 20:00    ____________________________________________   PROCEDURES Procedures  ____________________________________________  DIFFERENTIAL DIAGNOSIS   Bowel perforation, bowel obstruction, enteritis  CLINICAL IMPRESSION / ASSESSMENT AND PLAN / ED COURSE  Pertinent labs & imaging results that were available during my care of the patient were reviewed by me and considered in my medical decision making (see chart for  details).    Patient presents with p.o. intolerance, concerning abdominal exam.  Labs show a leukocytosis of 15,000.  CT scan shows a high-grade bowel obstruction due to a left inguinal hernia.  Unable to reduce this hernia on exam.  Discussed with general surgery who will come evaluate the patient for likely surgical management tonight.  I will place an NG tube, Foley, give a dose of Zosyn.  1 L bolus given for hydration.      ____________________________________________   FINAL CLINICAL IMPRESSION(S) / ED DIAGNOSES    Final diagnoses:  Left inguinal hernia  SBO (small bowel obstruction) [facility])     ED Discharge Orders    None  Portions of this note were generated with dragon dictation software. Dictation errors may occur despite best attempts at proofreading.   Carrie Mew, MD 07/12/18 2028

## 2018-07-12 NOTE — Anesthesia Post-op Follow-up Note (Signed)
Anesthesia QCDR form completed.        

## 2018-07-12 NOTE — ED Notes (Signed)
Pt states he was diagnosed with prostate cancer within the last two years, unable to provide date of diagnosis, or if he is taking any chemo medications at this time.

## 2018-07-12 NOTE — ED Notes (Addendum)
Dr. Dahlia Byes at bedside, states he is going to transport pt to OR himself and needs to leave immediately. All of pt's belongings placed in bed with patient. Leads and unnecessary wires removed and pt prepped to transport. Pt's primary nurse Deon Pilling notified of transport by ED secretary.

## 2018-07-12 NOTE — Op Note (Signed)
Left open  Inguinal Hernia Repair  IMMANUEL FEDAK  07/12/2018  Pre-operative Diagnosis: Incarcerated Left Inguinal Hernia with bowel obstruction  Post-operative Diagnosis: same  Procedure:Shouldice repair of left inguinal hernia incarcerated  Surgeon: Caroleen Hamman, MD FACS  Anesthesia: Gen. with endotracheal tube  Findings: Incarcerated Recurrent Indirect IH, viable bowel. Evidence of previous mesh and repair on the left. Evidence of direct inguinal hernia  Procedure Details  The patient was seen again in the Holding Room. The benefits, complications, treatment options, and expected outcomes were discussed with the patient. The risks of bleeding, infection, recurrence of symptoms, failure to resolve symptoms, recurrence of hernia, ischemic orchitis, chronic pain syndrome or neuroma, were discussed again. The likelihood of improving the patient's symptoms with return to their baseline status is good.  The patient and/or family concurred with the proposed plan, giving informed consent.  The patient was taken to Operating Room, identified as DEKLAN MINAR and the procedure verified as Laparoscopic Inguinal Hernia Repair. Laterality confirmed.  A Time Out was held and the above information confirmed.  Prior to the induction of general anesthesia, antibiotic prophylaxis was administered. VTE prophylaxis was in place. General endotracheal anesthesia was then administered and tolerated well. After the induction, the abdomen was prepped with Chloraprep and draped in the sterile fashion. The patient was positioned in the supine position.   Left inguinal incision was created with a 15 blade knife and electrocautery was used to dissect through subcutaneous tissue and the Scarpa's fascia.  The external oblique fascia was identified and incised with Metzenbaum scissors.  We identified the ilioinguinal nerve and preserved at all times.  We were able to dissect the hernia sac from the adjacent  structures.  We did notice evidence of a prior repair with mesh.  We were able to visualize the sac opened it up visualized small piece of small bowel were able to reduce this and ligated the sac.  The bowel was viable no evidence of necrosis.  We also visualized and no other defect medial to epigastric vessels corresponded to a direct hernia as well.  We protected the cord structures at all times.  Given the evidence of incarceration and potential risk of infection I decided not to place mesh and do a tissue repair.  Shoulders repair was performed approximating the conjoined tendon to the shelving edge of the inguinal ligament with multiple interrupted 0 Ethibond sutures.  There was no tension.  Were also able to close both the indirect and direct defect in the standard fashion.  We perform an ilioinguinal nerve block with liposomal Marcaine and the external oblique fascia was closed with a running 3-0 Vicryl.  Skin was closed with 4-0 Monocryl in a subicular fashion.  Dermabon was applied  Patient tolerated the procedure well. There were no complications. He was taken to the recovery room in stable condition.               Caroleen Hamman, MD, FACS

## 2018-07-12 NOTE — Anesthesia Procedure Notes (Signed)
Procedure Name: Intubation Performed by: Lance Muss, CRNA Pre-anesthesia Checklist: Patient identified, Patient being monitored, Timeout performed, Emergency Drugs available and Suction available Patient Re-evaluated:Patient Re-evaluated prior to induction Oxygen Delivery Method: Circle system utilized Preoxygenation: Pre-oxygenation with 100% oxygen Induction Type: IV induction Ventilation: Mask ventilation without difficulty Laryngoscope Size: Mac and 3 Grade View: Grade I Tube type: Oral Tube size: 7.5 mm Number of attempts: 1 Airway Equipment and Method: Stylet Placement Confirmation: ETT inserted through vocal cords under direct vision,  positive ETCO2 and breath sounds checked- equal and bilateral Secured at: 23 cm Tube secured with: Tape Dental Injury: Teeth and Oropharynx as per pre-operative assessment

## 2018-07-12 NOTE — Anesthesia Preprocedure Evaluation (Signed)
Anesthesia Evaluation  Patient identified by MRN, date of birth, ID band Patient awake    Reviewed: Allergy & Precautions, H&P , NPO status , Patient's Chart, lab work & pertinent test results, reviewed documented beta blocker date and time   Airway Mallampati: II  TM Distance: >3 FB Neck ROM: full    Dental  (+) Teeth Intact   Pulmonary neg pulmonary ROS, former smoker,    Pulmonary exam normal        Cardiovascular Exercise Tolerance: Good negative cardio ROS Normal cardiovascular exam+ Valvular Problems/Murmurs  Rhythm:regular Rate:Normal  Echo from Sept reveals 55% and moderate AS.  JA   Neuro/Psych negative neurological ROS  negative psych ROS   GI/Hepatic negative GI ROS, Neg liver ROS,   Endo/Other  negative endocrine ROS  Renal/GU negative Renal ROS  negative genitourinary   Musculoskeletal   Abdominal   Peds  Hematology negative hematology ROS (+)   Anesthesia Other Findings Past Medical History: No date: Does use hearing aid 06/03/2016: Empyema lung ([place])     Comment:  MODERATE WBC PRESENT, PREDOMINANTLY PMN, STREP               INTERMEDIUS No date: Hyperlipidemia Nov/Dec 2017: Pneumonia Past Surgical History: 1971: APPENDECTOMY 2014: COLONOSCOPY No date: EYE SURGERY     Comment:  cataract surgery  06/20/2018: GREEN LIGHT LASER TURP (TRANSURETHRAL RESECTION OF  PROSTATE; N/A     Comment:  Procedure: GREEN LIGHT LASER TURP (TRANSURETHRAL               RESECTION OF PROSTATE;  Surgeon: Royston Cowper, MD;                Location: [facility] ORS;  Service: Urology;  Laterality: N/A; 2008: HERNIA REPAIR; Left 12/01/2016: INGUINAL HERNIA REPAIR; Right     Comment:  Right inguinal hernia repair with medium Ultra Pro mesh. 12/01/2016: INSERTION OF MESH; Right     Comment:  Procedure: INSERTION OF MESH;  Surgeon: Robert Bellow, MD;  Location: [facility] ORS;  Service: General;    Laterality: Right; 1944: TONSILLECTOMY BMI    Body Mass Index:  21.52 kg/m     Reproductive/Obstetrics negative OB ROS                             Anesthesia Physical Anesthesia Plan  ASA: III and emergent  Anesthesia Plan: General ETT   Post-op Pain Management:    Induction:   PONV Risk Score and Plan:   Airway Management Planned:   Additional Equipment:   Intra-op Plan:   Post-operative Plan:   Informed Consent: I have reviewed the patients History and Physical, chart, labs and discussed the procedure including the risks, benefits and alternatives for the proposed anesthesia with the patient or authorized representative who has indicated his/her understanding and acceptance.   Dental Advisory Given  Plan Discussed with: CRNA  Anesthesia Plan Comments:         Anesthesia Quick Evaluation

## 2018-07-13 ENCOUNTER — Encounter: Payer: Self-pay | Admitting: Surgery

## 2018-07-13 ENCOUNTER — Inpatient Hospital Stay: Payer: Medicare Other

## 2018-07-13 LAB — CBC
HEMATOCRIT: 36.9 % — AB (ref 39.0–52.0)
Hemoglobin: 12 g/dL — ABNORMAL LOW (ref 13.0–17.0)
MCH: 29.6 pg (ref 26.0–34.0)
MCHC: 32.5 g/dL (ref 30.0–36.0)
MCV: 90.9 fL (ref 80.0–100.0)
Platelets: 240 10*3/uL (ref 150–400)
RBC: 4.06 MIL/uL — ABNORMAL LOW (ref 4.22–5.81)
RDW: 13.1 % (ref 11.5–15.5)
WBC: 12.7 10*3/uL — AB (ref 4.0–10.5)
nRBC: 0 % (ref 0.0–0.2)

## 2018-07-13 LAB — BASIC METABOLIC PANEL
Anion gap: 6 (ref 5–15)
BUN: 19 mg/dL (ref 8–23)
CALCIUM: 8.5 mg/dL — AB (ref 8.9–10.3)
CO2: 25 mmol/L (ref 22–32)
Chloride: 109 mmol/L (ref 98–111)
Creatinine, Ser: 0.79 mg/dL (ref 0.61–1.24)
GFR calc Af Amer: >60 mL/min (ref >60)
GFR calc non Af Amer: >60 mL/min (ref >60)
GLUCOSE: 157 mg/dL — AB (ref 70–99)
Potassium: 4.4 mmol/L (ref 3.5–5.1)
Sodium: 140 mmol/L (ref 135–145)

## 2018-07-13 LAB — PHOSPHORUS: Phosphorus: 3.4 mg/dL (ref 2.5–4.6)

## 2018-07-13 LAB — MAGNESIUM: Magnesium: 2.2 mg/dL (ref 1.7–2.4)

## 2018-07-13 MED ORDER — KCL IN DEXTROSE-NACL 20-5-0.9 MEQ/L-%-% IV SOLN
INTRAVENOUS | Status: DC
  Administered 2018-07-13 (×3): via INTRAVENOUS
  Filled 2018-07-13 (×6): qty 1000

## 2018-07-13 MED ORDER — PROCHLORPERAZINE MALEATE 10 MG PO TABS: 10.0000 mg | ORAL_TABLET | Freq: Four times a day (QID) | ORAL | Status: DC | PRN

## 2018-07-13 MED ORDER — PANTOPRAZOLE SODIUM 40 MG IV SOLR
40.0000 mg | Freq: Every day | INTRAVENOUS | Status: DC
  Administered 2018-07-13 – 2018-07-16 (×5): 40 mg via INTRAVENOUS
  Filled 2018-07-13 (×5): qty 40

## 2018-07-13 MED ORDER — KETOROLAC TROMETHAMINE 15 MG/ML IJ SOLN
15.0000 mg | Freq: Four times a day (QID) | INTRAMUSCULAR | Status: DC
  Administered 2018-07-13 – 2018-07-17 (×17): 15 mg via INTRAVENOUS
  Filled 2018-07-13 (×17): qty 1

## 2018-07-13 MED ORDER — ONDANSETRON HCL 4 MG/2ML IJ SOLN: 4.0000 mg | Freq: Four times a day (QID) | INTRAMUSCULAR | Status: DC | PRN

## 2018-07-13 MED ORDER — HEPARIN SODIUM (PORCINE) 5000 UNIT/ML IJ SOLN
5000.0000 [IU] | Freq: Three times a day (TID) | INTRAMUSCULAR | Status: DC
  Administered 2018-07-13 – 2018-07-17 (×14): 5000 [IU] via SUBCUTANEOUS
  Filled 2018-07-13 (×14): qty 1

## 2018-07-13 MED ORDER — SODIUM CHLORIDE 0.9 % IV SOLN
1.0000 g | INTRAVENOUS | Status: DC
  Administered 2018-07-13 – 2018-07-15 (×3): 1 g via INTRAVENOUS
  Filled 2018-07-13: qty 1
  Filled 2018-07-13 (×2): qty 10

## 2018-07-13 MED ORDER — ONDANSETRON 4 MG PO TBDP: 4.0000 mg | ORAL_TABLET | Freq: Four times a day (QID) | ORAL | Status: DC | PRN

## 2018-07-13 MED ORDER — TAMSULOSIN HCL 0.4 MG PO CAPS
0.4000 mg | ORAL_CAPSULE | Freq: Every day | ORAL | Status: DC
  Administered 2018-07-13 – 2018-07-16 (×4): 0.4 mg via ORAL
  Filled 2018-07-13 (×4): qty 1

## 2018-07-13 MED ORDER — PROCHLORPERAZINE EDISYLATE 10 MG/2ML IJ SOLN
5.0000 mg | Freq: Four times a day (QID) | INTRAMUSCULAR | Status: DC | PRN
  Filled 2018-07-13: qty 2

## 2018-07-13 MED ORDER — HYDRALAZINE HCL 20 MG/ML IJ SOLN: 10.0000 mg | INTRAMUSCULAR | Status: DC | PRN

## 2018-07-13 MED ORDER — MORPHINE SULFATE (PF) 4 MG/ML IV SOLN: 2.0000 mg | INTRAVENOUS | Status: DC | PRN

## 2018-07-13 MED ORDER — ACETAMINOPHEN 10 MG/ML IV SOLN
1000.0000 mg | Freq: Four times a day (QID) | INTRAVENOUS | Status: AC
  Administered 2018-07-13 (×4): 1000 mg via INTRAVENOUS
  Filled 2018-07-13 (×4): qty 100

## 2018-07-13 NOTE — Consult Note (Signed)
Patient Demographics  Jeffery [name], is a 84 y.o. male   MRN: [id number]   DOB - May 21, 1931  Admit Date - 07/12/2018    Outpatient Primary MD for the patient is Albina Billet, MD  Consult requested in the Hospital by [place], [place], MD, On 07/13/2018    Reason for consult ' delirium  HPI: 85 year old male patient with history of hyperlipidemia, empyema of the lung before comes in because of nausea, vomiting, left inguinal pain, admitted to surgical service for incarcerated left inguinal hernia, small bowel obstruction.  Emergency inguinal hernia repair by surgeon Dr. Adora Fridge yesterday.  Now has NG tube to low intermittent suction.  Noted to be confused since morning.  Has a sitter at bedside now.  Blood work shows slightly elevated white count, UTI.  Past Medical History:  Diagnosis Date  . Does use hearing aid   . Empyema lung ([place]) 06/03/2016   MODERATE WBC PRESENT, PREDOMINANTLY PMN, STREP INTERMEDIUS  . Hyperlipidemia   . Pneumonia Nov/Dec 2017      Past Surgical History:  Procedure Laterality Date  . APPENDECTOMY  1971  . COLONOSCOPY  2014  . EYE SURGERY     cataract surgery   . GREEN LIGHT LASER TURP (TRANSURETHRAL RESECTION OF PROSTATE N/A 06/20/2018   Procedure: GREEN LIGHT LASER TURP (TRANSURETHRAL RESECTION OF PROSTATE;  Surgeon: Royston Cowper, MD;  Location: [facility] ORS;  Service: Urology;  Laterality: N/A;  . HERNIA REPAIR Left 2008  . INGUINAL HERNIA REPAIR Right 12/01/2016   Right inguinal hernia repair with medium Ultra Pro mesh.  . INGUINAL HERNIA REPAIR Left 07/12/2018   Procedure: HERNIA REPAIR INGUINAL ADULT;  Surgeon: Jules Husbands, MD;  Location: [facility] OR[facility];  Service: General;  Laterality: Left;  . INSERTION OF MESH Right 12/01/2016   Procedure: INSERTION OF MESH;  Surgeon: Robert Bellow, MD;   Location: [facility] OR[facility];  Service: General;  Laterality: Right;  . TONSILLECTOMY  1944           Review of Systems    I patient is confused, unable to obtain full review of systems but he does not appear to be in distress and says that he had 3-4 operations since he is here, his wife is 53 year old.   Social History Social History   Tobacco Use  . Smoking status: Former Smoker    Packs/day: 1.00    Years: 10.00    Pack years: 10.00    Types: Cigarettes  . Smokeless tobacco: Never Used  . Tobacco comment: quit smoking in 1970  Substance Use Topics  . Alcohol use: Yes    Comment: occ 3/week     Family History Family History  Problem Relation Age of Onset  . Heart attack Father   . ALS Sister   . Cancer Sister   . ALS Sister      Prior to Admission medications   Medication Sig Start Date End Date Taking? Authorizing Provider  donepezil (ARICEPT) 10 MG tablet Take 10 mg  by mouth at bedtime.   Yes [provider]  acetaminophen-codeine (TYLENOL #3) 300-30 MG tablet Take 1-2 tablets by mouth every 4 (four) hours as needed for moderate pain. 06/20/18   Royston Cowper, MD  B Complex-Biotin-FA (SUPER B-50 PO) Take 1 capsule by mouth every Monday, Wednesday, and Friday.     [provider]  brimonidine-timolol (COMBIGAN) 0.2-0.5 % ophthalmic solution Place 1 drop into both eyes every 12 (twelve) hours.    [provider]  ciprofloxacin (CIPRO) 500 MG tablet Take 1 tablet (500 mg total) by mouth 2 (two) times daily. 06/20/18   Royston Cowper, MD  docusate sodium (COLACE) 100 MG capsule Take 2 capsules (200 mg total) by mouth 2 (two) times daily. 06/20/18   Royston Cowper, MD  Glucosamine-Chondroitin 500-250 MG CAPS Take 1 capsule by mouth every Monday, Wednesday, and Friday.     [provider]  Meth-Hyo-M Bl-Na Phos-Ph Sal (URIBEL) 118 MG CAPS Take 1 capsule (118 mg total) by mouth every 6 (six) hours as needed (dysuria). 06/20/18   Royston Cowper, MD  Multiple Vitamins-Minerals (SUPER ANTIOXIDANT PO) Take 1 tablet by mouth every Monday, Wednesday, and Friday.     [provider]  Omega-3 Fatty Acids (FISH OIL) 1000 MG CAPS Take 1,000 mg by mouth 4 (four) times a week.     [provider]  tamsulosin (FLOMAX) 0.4 MG CAPS capsule Take 0.4 mg by mouth daily after supper.    [provider]    Anti-infectives (From admission, onward)   Start     Dose/Rate Route Frequency Ordered Stop   07/12/18 2030  piperacillin-tazobactam (ZOSYN) IVPB 3.375 g     3.375 g 100 mL/hr over 30 Minutes Intravenous  Once 07/12/18 2023 07/12/18 2137      Scheduled Meds: . heparin  5,000 Units Subcutaneous Q8H  . ketorolac  15 mg Intravenous Q6H  . pantoprazole (PROTONIX) IV  40 mg Intravenous QHS  . tamsulosin  0.4 mg Oral QPC supper   Continuous Infusions: . acetaminophen 1,000 mg (07/13/18 4827)  . dextrose 5 % and 0.9 % NaCl with KCl 20 mEq/L 125 mL/hr at 07/13/18 0913   PRN Meds:.hydrALAZINE, morphine injection, ondansetron **OR** ondansetron (ZOFRAN) IV, prochlorperazine **OR** prochlorperazine  No Known Allergies  Physical Exam  Vitals  Blood pressure 119/61, pulse 77, temperature 97.9 F (36.6 C), temperature source Oral, resp. rate 20, height 5\' 10"  (1.778 m), weight 68 kg, SpO2 95 %.   1. General ; ; 84 year old male patient lying in the bed and does not appear to be in distress, pleasantly confused.  2. Normal affect and insight, Not Suicidal or Homicidal, 3. No F.N deficits, ALL C.Nerves Intact, Strength 5/5 all 4 extremities, Sensation intact all 4 extremities, Plantars down going.  4. Ears and Eyes appear Normal, Conjunctivae clear, PERRLA. Moist Oral Mucosa.  5. Supple Neck, No JVD, No cervical lymphadenopathy appriciated, No Carotid Bruits.  6. Symmetrical Chest wall movement, Good air movement bilaterally, CTAB.  7. RRR, N patient has ejection systolic murmur present in aortic area.  No  Parasternal Heave.  8. Positive Bowel Sounds, appears intact, NG tube draining black tarry stuff in canister.  9.  No Cyanosis, Normal Skin Turgor, No Skin Rash or Bruise.  10. Good muscle tone,  joints appear normal , no effusions, Normal ROM.  11. No Palpable Lymph Nodes in Neck or Axillae    Data Review  CBC Recent Labs  Lab 07/12/18 1939 07/13/18  0453  WBC 15.4* 12.7*  HGB 13.3 12.0*  HCT 41.3 36.9*  PLT 283 240  MCV 90.8 90.9  MCH 29.2 29.6  MCHC 32.2 32.5  RDW 13.0 13.1  LYMPHSABS 0.7  --   MONOABS 1.0  --   EOSABS 0.0  --   BASOSABS 0.0  --    ------------------------------------------------------------------------------------------------------------------  Chemistries  Recent Labs  Lab 07/12/18 1939 07/13/18 0453  NA 139 140  K 3.9 4.4  CL 104 109  CO2 29 25  GLUCOSE 152* 157*  BUN 21 19  CREATININE 0.81 0.79  CALCIUM 9.0 8.5*  MG  --  2.2  AST 14*  --   ALT 11  --   ALKPHOS 79  --   BILITOT 1.0  --    ------------------------------------------------------------------------------------------------------------------ estimated creatinine clearance is 64.9 mL/min (by C-G formula based on SCr of 0.79 mg/dL). ------------------------------------------------------------------------------------------------------------------ No results for input(s): TSH, T4TOTAL, T3FREE, THYROIDAB in the last 72 hours.  Invalid input(s): FREET3   Coagulation profile No results for input(s): INR, PROTIME in the last 168 hours. ------------------------------------------------------------------------------------------------------------------- No results for input(s): DDIMER in the last 72 hours. -------------------------------------------------------------------------------------------------------------------  Cardiac Enzymes No results for input(s): CKMB, TROPONINI, MYOGLOBIN in the last 168 hours.  Invalid input(s):  CK ------------------------------------------------------------------------------------------------------------------ Invalid input(s): POCBNP   ---------------------------------------------------------------------------------------------------------------  Urinalysis    Component Value Date/Time   COLORURINE RED (A) 07/12/2018 2008   APPEARANCEUR TURBID (A) 07/12/2018 2008   LABSPEC 1.026 07/12/2018 2008   PHURINE 8.0 07/12/2018 2008   GLUCOSEU NEGATIVE 07/12/2018 2008   HGBUR MODERATE (A) 07/12/2018 2008   [place] NEGATIVE 07/12/2018 2008   KETONESUR 5 (A) 07/12/2018 2008   PROTEINUR >=300 (A) 07/12/2018 2008   NITRITE POSITIVE (A) 07/12/2018 2008   LEUKOCYTESUR MODERATE (A) 07/12/2018 2008     Imaging results:   Ct Abdomen Pelvis Wo Contrast  Result Date: 07/12/2018 CLINICAL DATA:  Nausea and vomiting over last 2 days, unable to keep any food down, high grade bowel obstruction, intractable hiccups EXAM: CT ABDOMEN AND PELVIS WITHOUT CONTRAST TECHNIQUE: Multidetector CT imaging of the abdomen and pelvis was performed following the standard protocol without IV contrast. Sagittal and coronal MPR images reconstructed from axial data set. Oral contrast was not administered. COMPARISON:  None Correlation: CT chest 06/21/2016 FINDINGS: Lower chest: Mild RIGHT basilar atelectasis. Lung bases appear hyperaerated. Minimal infiltrate in lingula. Hepatobiliary: Tiny appendix cysts. Liver otherwise unremarkable. Contracted gallbladder. Pancreas: Atrophic pancreas without obvious mass Spleen: Normal appearance Adrenals/Urinary Tract: Thickening of adrenal glands without discrete mass. 14 x 12 mm cyst posterior RIGHT kidney. Tiny nonobstructing LEFT renal calculus. Kidneys, ureters, and bladder otherwise normal appearance. Stomach/Bowel: Distended stomach containing fluid and food debris. Fluid within distal esophagus which likely reflects reflux and gastric distention. Dilated small bowel loops  throughout abdomen and pelvis. Distal small bowel decompressed. Transition zone is at a small bowel loop which extends into a LEFT inguinal hernia, causing obstruction. Colon decompressed. Vascular/Lymphatic: Extensive atherosclerotic calcification aorta without aneurysm. No adenopathy. Reproductive: Prostatic enlargement, gland 5.4 x 5.3 x 5.1 cm (volume = 76 cm^3). Other: No free air or free fluid.  No additional hernias. Musculoskeletal: Degenerative disc disease changes of the thoracolumbar spine. Old appearing superior endplate height loss/compression deformity of L1 vertebral body unchanged. IMPRESSION: High-grade mid small bowel obstruction secondary to a small bowel loop within the LEFT inguinal hernia. Prostatic enlargement. Minimal infiltrate in lingula. Nonobstructing LEFT renal calculus. Electronically Signed   By: Lavonia Dana M.D.   On: 07/12/2018 20:00  Dg Abd Portable 2v  Result Date: 07/13/2018 CLINICAL DATA:  Small bowel obstruction EXAM: PORTABLE ABDOMEN - 2 VIEW COMPARISON:  CT scan July 12, 2018 FINDINGS: The NG tube terminates in the stomach. Mildly dilated loops of small bowel remain. IMPRESSION: The NG tube terminates in the stomach. Mildly dilated loops of small bowel remain. Electronically Signed   By: Dorise Bullion III M.D   On: 07/13/2018 08:18     Assessment & Plan  Active Problems:   Incarcerated inguinal hernia    36. /84 year old male with incarcerated left inguinal hernia status post repair by surgery yesterday, patient also has small bowel obstruction, getting NG tube to suction, now on n.p.o., IV fluids. 2.  Acute delirium likely secondary to advanced age, postoperative state, UTI: Continue IV fluids, treat the UTI, obtain urine cultures, avoid narcotics, #3 has baseline dementia with cognitive impairment as per the charts, use Aricept. 4.  History of BPH, bladder outlet obstruction, patient followed by Dr. Eliberto Ivory, status post laser surgery for the prostate  recently.  Patient also noted to have adenocarcinoma of the prostate.  DVT Prophylaxis Heparin -  Lovenox -  AM Labs Ordered, also please review Full Orders  Family Communication: Plan discussed with nurse.   Thank you for the consult, we will follow the patient with you in the Hospital.   Epifanio Lesches M.D on 07/13/2018 at 10:39 AM

## 2018-07-13 NOTE — Care Management Note (Signed)
Case Management Note  Patient Details  Name: CLETO CLAGGETT MRN: [id number] Date of Birth: [birth year]  Subjective/Objective:                  Presented from home for nausea and vomiting.  Small bowel obstruction due to inguinal hernia.  Also found to have ileus.  Currently has nasogastric tube to low internmittent suction. Sips.  Has episode of delirium which required 1:1 safety sitter which has since been discontinued.  Physical therapy has recommended skilled nursing facility. Wife is not able to provide physical assist.  Patient ambulated 120 feet but required 1 person hand held assist.  No family present to discuss discharge disposition   Action/Plan:  Hope to see functional status improve so can return home with home health if at all possible.  Expected Discharge Date:  07/15/18               Expected Discharge Plan:     In-House Referral:     Discharge planning Services     Post Acute Care Choice:    Choice offered to:     DME Arranged:    DME Agency:     HH Arranged:    HH Agency:     Status of Service:     If discussed at [employer], dates discussed:    Additional Comments:  Katrina Stack, RN 07/13/2018, 6:00 PM

## 2018-07-13 NOTE — Progress Notes (Signed)
Unable to complete admissions questions due to patient being confused since admission to room. Will continue to monitor.

## 2018-07-13 NOTE — Progress Notes (Signed)
Patient more confused and pulled IVs out. Trying to pull on NG Tubes. Notified Dr. Dahlia Byes of patient's confusion and concern for safety. MD ordered to discontinue foley catheter and ordered a sitter at bedside. Will remove foley and RN staff at bedside now.

## 2018-07-13 NOTE — Progress Notes (Signed)
POD # 1 Did well , some delirium but now improved AVSS KUB persistent dilation sb + flatus  PE NAD Abd: soft, T, decreased BS, incisions c/d/i, no peritonitis Ext: well perfused  A/p Doing well Keep NGT for now since there is an ileus Appreciate hospitalist consult mobilize

## 2018-07-13 NOTE — Evaluation (Signed)
Physical Therapy Evaluation Patient Details Name: Jeffery [name] MRN: [id number] DOB: 1930/12/28 Today's Date: 07/13/2018   History of Present Illness  84 yo male with onset of SBO and incarcerated inguinal hernia was admitted and referred to PT.  Pt has L kidney stone, drainage in NG cannister, ileus and chronic L1 fracture noted.  PMHx:  PNA, HOH, empyema, atherosclerosis,   Clinical Impression  Pt is noted to have some confusion but is able to follow instructions for mobility evaluation.  He is somewhat impulsive, has a Actuary with him and is on fall precautions.  Has used IV pole to walk but requires more support, and therefore is going to need RW and more practice to recover his independence with gait.  Given unsafe presentation and incontinence with urine, need for AD and lack of awareness of safety, will progress him to walking acutely but will need SNF to make the full transition home with wife, who cannot assist him physically.  Strengthen hips and monitor his safety with instructions.    Follow Up Recommendations SNF    Equipment Recommendations  None recommended by PT    Recommendations for Other Services       Precautions / Restrictions Precautions Precautions: Fall(telemetry) Restrictions Weight Bearing Restrictions: No      Mobility  Bed Mobility Overal bed mobility: Needs Assistance Bed Mobility: Supine to Sit;Sit to Supine     Supine to sit: Min assist Sit to supine: Min assist   General bed mobility comments: help to lift trunk to get into bed and afterward to reposition on bed  Transfers Overall transfer level: Needs assistance Equipment used: 1 person hand held assist Transfers: Sit to/from Stand Sit to Stand: Min assist;From elevated surface(using IV pole)         General transfer comment: pt relies on support of walking device  Ambulation/Gait Ambulation/Gait assistance: Min guard;Min assist Gait Distance (Feet): 120 Feet Assistive device: IV  Pole;1 person hand held assist Gait Pattern/deviations: Step-through pattern;Decreased stride length;Shuffle Gait velocity: reduced Gait velocity interpretation: <1.31 ft/sec, indicative of household ambulator General Gait Details: pt is flexed over IV pole, using RW with support of belt and cues for safety and setting limits of distance  Stairs            Wheelchair Mobility    Modified Rankin (Stroke Patients Only)       Balance Overall balance assessment: Needs assistance Sitting-balance support: Feet supported Sitting balance-Leahy Scale: Fair     Standing balance support: Bilateral upper extremity supported;During functional activity Standing balance-Leahy Scale: Poor                               Pertinent Vitals/Pain Pain Assessment: No/denies pain    Home Living Family/patient expects to be discharged to:: Private residence Living Arrangements: Spouse/significant other Available Help at Discharge: Family;Available 24 hours/day Type of Home: House       Home Layout: One level Home Equipment: Cane - single point      Prior Function Level of Independence: Independent         Comments: Home walking with no device     Hand Dominance   Dominant Hand: Right    Extremity/Trunk Assessment   Upper Extremity Assessment Upper Extremity Assessment: Overall WFL for tasks assessed    Lower Extremity Assessment Lower Extremity Assessment: Generalized weakness    Cervical / Trunk Assessment Cervical / Trunk Assessment: Normal  Communication  Communication: No difficulties  Cognition Arousal/Alertness: Awake/alert Behavior During Therapy: Impulsive Overall Cognitive Status: Impaired/Different from baseline Area of Impairment: Memory;Following commands;Safety/judgement;Awareness;Problem solving                     Memory: Decreased recall of precautions;Decreased short-term memory Following Commands: Follows one step commands  with increased time Safety/Judgement: Decreased awareness of safety;Decreased awareness of deficits Awareness: Intellectual;Anticipatory Problem Solving: Slow processing;Decreased initiation;Requires verbal cues General Comments: pt is cued for use of IV pole as walker but cannot walk without wb support      General Comments      Exercises     Assessment/Plan    PT Assessment Patient needs continued PT services  PT Problem List Decreased strength;Decreased range of motion;Decreased activity tolerance;Decreased balance;Decreased mobility;Decreased coordination;Decreased knowledge of use of DME;Decreased safety awareness       PT Treatment Interventions DME instruction;Gait training;Stair training;Functional mobility training;Therapeutic activities;Therapeutic exercise;Balance training;Neuromuscular re-education;Patient/family education    PT Goals (Current goals can be found in the Care Plan section)  Acute Rehab PT Goals Patient Stated Goal: to walk and try to get home PT Goal Formulation: With patient Time For Goal Achievement: 07/27/18 Potential to Achieve Goals: Good    Frequency Min 2X/week   Barriers to discharge Decreased caregiver support home with wife who is not well and in hosp currently    Co-evaluation               AM-PAC PT "6 Clicks" Mobility  Outcome Measure Help needed turning from your back to your side while in a flat bed without using bedrails?: A Little Help needed moving from lying on your back to sitting on the side of a flat bed without using bedrails?: A Little Help needed moving to and from a bed to a chair (including a wheelchair)?: A Little Help needed standing up from a chair using your arms (e.g., wheelchair or bedside chair)?: A Little Help needed to walk in hospital room?: A Lot Help needed climbing 3-5 steps with a railing? : Total 6 Click Score: 15    End of Session Equipment Utilized During Treatment: Gait belt Activity  Tolerance: Patient tolerated treatment well;Patient limited by fatigue;Other (comment)(higher fall risk) Patient left: in bed;with call bell/phone within reach;with bed alarm set Nurse Communication: Mobility status PT Visit Diagnosis: Unsteadiness on feet (R26.81);Muscle weakness (generalized) (M62.81);Difficulty in walking, not elsewhere classified (R26.2)    Time: 1448-1856 PT Time Calculation (min) (ACUTE ONLY): 36 min   Charges:   PT Evaluation $PT Eval Moderate Complexity: 1 Mod         Ramond Dial 07/13/2018, 4:52 PM   Mee Hives, PT MS Acute Rehab Dept. Number: [place]

## 2018-07-14 ENCOUNTER — Inpatient Hospital Stay: Payer: Medicare Other

## 2018-07-14 LAB — BASIC METABOLIC PANEL
Anion gap: 3 — ABNORMAL LOW (ref 5–15)
Anion gap: 4 — ABNORMAL LOW (ref 5–15)
BUN: 15 mg/dL (ref 8–23)
BUN: 14 mg/dL (ref 8–23)
CO2: 26 mmol/L (ref 22–32)
CO2: 25 mmol/L (ref 22–32)
Calcium: 8.1 mg/dL — ABNORMAL LOW (ref 8.9–10.3)
Calcium: 8.1 mg/dL — ABNORMAL LOW (ref 8.9–10.3)
Chloride: 115 mmol/L — ABNORMAL HIGH (ref 98–111)
Chloride: 114 mmol/L — ABNORMAL HIGH (ref 98–111)
Creatinine, Ser: 0.87 mg/dL (ref 0.61–1.24)
Creatinine, Ser: 0.83 mg/dL (ref 0.61–1.24)
GFR calc Af Amer: >60 mL/min (ref >60)
GFR calc Af Amer: >60 mL/min (ref >60)
GFR calc non Af Amer: >60 mL/min (ref >60)
GFR calc non Af Amer: >60 mL/min (ref >60)
Glucose, Bld: 99 mg/dL (ref 70–99)
Glucose, Bld: 104 mg/dL — ABNORMAL HIGH (ref 70–99)
Potassium: 3.8 mmol/L (ref 3.5–5.1)
Potassium: 4.1 mmol/L (ref 3.5–5.1)
Sodium: 144 mmol/L (ref 135–145)
Sodium: 143 mmol/L (ref 135–145)

## 2018-07-14 LAB — CBC
HCT: 33.9 % — ABNORMAL LOW (ref 39.0–52.0)
Hemoglobin: 10.5 g/dL — ABNORMAL LOW (ref 13.0–17.0)
MCH: 29.2 pg (ref 26.0–34.0)
MCHC: 31 g/dL (ref 30.0–36.0)
MCV: 94.4 fL (ref 80.0–100.0)
PLATELETS: 148 10*3/uL — AB (ref 150–400)
RBC: 3.59 MIL/uL — ABNORMAL LOW (ref 4.22–5.81)
RDW: 13.2 % (ref 11.5–15.5)
WBC: 5.3 10*3/uL (ref 4.0–10.5)
nRBC: 0 % (ref 0.0–0.2)

## 2018-07-14 MED ORDER — OXYCODONE HCL 5 MG PO TABS: 5.0000 mg | ORAL_TABLET | Freq: Four times a day (QID) | ORAL | Status: DC | PRN

## 2018-07-14 MED ORDER — ACETAMINOPHEN 500 MG PO TABS
1000.0000 mg | ORAL_TABLET | Freq: Four times a day (QID) | ORAL | Status: DC
  Administered 2018-07-14 – 2018-07-17 (×12): 1000 mg via ORAL
  Filled 2018-07-14 (×12): qty 2

## 2018-07-14 NOTE — Progress Notes (Signed)
Physical Therapy Treatment Patient Details Name: Jeffery [name] MRN: [id number] DOB: Nov 25, 1930 Today's Date: 07/14/2018    History of Present Illness 84 yo male with onset of SBO and incarcerated inguinal hernia was admitted and referred to PT.  Pt has L kidney stone, drainage in NG cannister, ileus and chronic L1 fracture noted.  PMHx:  PNA, HOH, empyema, atherosclerosis,     PT Comments    Pt was able to make much more improvement despite his low control of balance yesterday.  Has issues of safety awareness and requires an AD to steady himself.  However, did demonstrate with RW a safer gait and use of stairs.  Follow up with all plans to strengthen and progress his mobility, and will follow him for length of time to get to DC.   Follow Up Recommendations  SNF     Equipment Recommendations  None recommended by PT    Recommendations for Other Services       Precautions / Restrictions Precautions Precautions: Fall Restrictions Weight Bearing Restrictions: No    Mobility  Bed Mobility Overal bed mobility: Modified Independent                Transfers Overall transfer level: Modified independent   Transfers: Sit to/from Stand              Ambulation/Gait Ambulation/Gait assistance: Min guard Gait Distance (Feet): 500 Feet Assistive device: Rolling walker (2 wheeled);1 person hand held assist Gait Pattern/deviations: Step-through pattern;Decreased stride length Gait velocity: normal Gait velocity interpretation: <1.31 ft/sec, indicative of household ambulator General Gait Details: more upright with walker but suspect he will not use once discharged   Stairs Stairs: Yes Stairs assistance: Min guard Stair Management: Two rails;Forwards;Alternating pattern Number of Stairs: 7     Wheelchair Mobility    Modified Rankin (Stroke Patients Only)       Balance Overall balance assessment: Needs assistance Sitting-balance support: Feet  supported Sitting balance-Leahy Scale: Good     Standing balance support: Bilateral upper extremity supported;During functional activity Standing balance-Leahy Scale: Fair                              Cognition Arousal/Alertness: Awake/alert Behavior During Therapy: Impulsive Overall Cognitive Status: Impaired/Different from baseline Area of Impairment: Memory;Safety/judgement;Awareness;Problem solving                     Memory: Decreased short-term memory Following Commands: Follows one step commands with increased time Safety/Judgement: Decreased awareness of safety;Decreased awareness of deficits Awareness: Intellectual Problem Solving: Requires verbal cues General Comments: pt required redirection for use of RW but can follow along with one prompt initially      Exercises      General Comments        Pertinent Vitals/Pain Pain Assessment: No/denies pain    Home Living                      Prior Function            PT Goals (current goals can now be found in the care plan section) Acute Rehab PT Goals Patient Stated Goal: to walk and try to get home Progress towards PT goals: Progressing toward goals    Frequency    Min 2X/week      PT Plan Current plan remains appropriate    Co-evaluation  AM-PAC PT "6 Clicks" Mobility   Outcome Measure  Help needed turning from your back to your side while in a flat bed without using bedrails?: A Little Help needed moving from lying on your back to sitting on the side of a flat bed without using bedrails?: A Little Help needed moving to and from a bed to a chair (including a wheelchair)?: A Little Help needed standing up from a chair using your arms (e.g., wheelchair or bedside chair)?: A Little Help needed to walk in hospital room?: A Little Help needed climbing 3-5 steps with a railing? : A Little 6 Click Score: 18    End of Session Equipment Utilized During  Treatment: Gait belt Activity Tolerance: Patient tolerated treatment well Patient left: in bed;with call bell/phone within reach;with nursing/sitter in room Nurse Communication: Mobility status PT Visit Diagnosis: Unsteadiness on feet (R26.81);Muscle weakness (generalized) (M62.81);Difficulty in walking, not elsewhere classified (R26.2)     Time: 7939-0300 PT Time Calculation (min) (ACUTE ONLY): 20 min  Charges:  $Gait Training: 8-22 mins                     Ramond Dial 07/14/2018, 1:23 PM   Mee Hives, PT MS Acute Rehab Dept. Number: [place] [facility] [place]

## 2018-07-14 NOTE — Progress Notes (Signed)
POD # 2 Doing much better pasing gas Minimal pain Delirium improving AVSS Labs ok  PE NAD Alert, oriented and conversant Abd: soft, incisions c/d/i, no peritonitis no tenderness   A/p doing well Resolving SBO from incarcerated IH Mobilize DC NGT Clears DC 24-48 hrs

## 2018-07-14 NOTE — Care Management Important Message (Signed)
Copy of signed IM left with patient in room.  

## 2018-07-14 NOTE — Care Management (Addendum)
Patient still requiring 1:1 safety sitter for acute delerium. PT rec SNF. We were hopeful patient would progress enough to go home with home health but unfortunately the patients spouse has also since been admitted to the hospital. Therefor at this time patient cannot return home alone. RNCM to continue to follow for any needs and we will collaborate with social work to establish transitioning of care plans.   Update 1343- Patient was able to ambulate 500 feet with PT. PT still rec SNF. Per MD patients mental status is improving. Family voices concerns about a home d/c since wife is primary caregiver and she is expected to be hospitalized over the weekend. We will work with patient and family to decide on disposition.

## 2018-07-14 NOTE — Progress Notes (Signed)
[facility] at [place] NAME: Jeffery [name]    MR#:  [id number]  DATE OF BIRTH:  01/02/1931  SUBJECTIVE:  CHIEF COMPLAINT: Patient is pleasantly confused.  Sitter at bedside  REVIEW OF SYSTEMS:  Review of system unobtainable as the patient is altered  DRUG ALLERGIES:  No Known Allergies  VITALS:  Blood pressure 117/62, pulse 66, temperature 98.1 F (36.7 C), temperature source Oral, resp. rate 17, height 5\' 10"  (1.778 m), weight 68 kg, SpO2 100 %.  PHYSICAL EXAMINATION:  GENERAL:  84 y.o.-year-old patient lying in the bed with no acute distress.  EYES: Pupils equal, round, reactive to light and accommodation. No scleral icterus. Extraocular muscles intact.  HEENT: Head atraumatic, normocephalic. Oropharynx and nasopharynx clear.  NECK:  Supple, no jugular venous distention. No thyroid enlargement, no tenderness.  LUNGS: Normal breath sounds bilaterally, no wheezing, rales,rhonchi or crepitation. No use of accessory muscles of respiration.  CARDIOVASCULAR: S1, S2 normal. No murmurs, rubs, or gallops.  ABDOMEN: Soft, nontender, nondistended. Bowel sounds present.  EXTREMITIES: No pedal edema, cyanosis, or clubbing.  NEUROLOGIC: Awake, alert and oriented x1 sensation intact. Gait not checked.  PSYCHIATRIC: The patient is alert and oriented x 1 SKIN: No obvious rash, lesion, or ulcer.    LABORATORY PANEL:   CBC Recent Labs  Lab 07/14/18 0437  WBC 5.3  HGB 10.5*  HCT 33.9*  PLT 148*   ------------------------------------------------------------------------------------------------------------------  Chemistries  Recent Labs  Lab 07/12/18 1939 07/13/18 0453  07/14/18 0803  NA 139 140   < > 143  K 3.9 4.4   < > 4.1  CL 104 109   < > 114*  CO2 29 25   < > 25  GLUCOSE 152* 157*   < > 104*  BUN 21 19   < > 14  CREATININE 0.81 0.79   < > 0.83  CALCIUM 9.0 8.5*   < > 8.1*  MG  --  2.2  --   --   AST 14*  --   --   --    ALT 11  --   --   --   ALKPHOS 79  --   --   --   BILITOT 1.0  --   --   --    < > = values in this interval not displayed.   ------------------------------------------------------------------------------------------------------------------  Cardiac Enzymes No results for input(s): TROPONINI in the last 168 hours. ------------------------------------------------------------------------------------------------------------------  RADIOLOGY:  Ct Abdomen Pelvis Wo Contrast  Result Date: 07/12/2018 CLINICAL DATA:  Nausea and vomiting over last 2 days, unable to keep any food down, high grade bowel obstruction, intractable hiccups EXAM: CT ABDOMEN AND PELVIS WITHOUT CONTRAST TECHNIQUE: Multidetector CT imaging of the abdomen and pelvis was performed following the standard protocol without IV contrast. Sagittal and coronal MPR images reconstructed from axial data set. Oral contrast was not administered. COMPARISON:  None Correlation: CT chest 06/21/2016 FINDINGS: Lower chest: Mild RIGHT basilar atelectasis. Lung bases appear hyperaerated. Minimal infiltrate in lingula. Hepatobiliary: Tiny appendix cysts. Liver otherwise unremarkable. Contracted gallbladder. Pancreas: Atrophic pancreas without obvious mass Spleen: Normal appearance Adrenals/Urinary Tract: Thickening of adrenal glands without discrete mass. 14 x 12 mm cyst posterior RIGHT kidney. Tiny nonobstructing LEFT renal calculus. Kidneys, ureters, and bladder otherwise normal appearance. Stomach/Bowel: Distended stomach containing fluid and food debris. Fluid within distal esophagus which likely reflects reflux and gastric distention. Dilated small bowel loops throughout abdomen and pelvis. Distal small bowel decompressed. Transition zone is  at a small bowel loop which extends into a LEFT inguinal hernia, causing obstruction. Colon decompressed. Vascular/Lymphatic: Extensive atherosclerotic calcification aorta without aneurysm. No adenopathy.  Reproductive: Prostatic enlargement, gland 5.4 x 5.3 x 5.1 cm (volume = 76 cm^3). Other: No free air or free fluid.  No additional hernias. Musculoskeletal: Degenerative disc disease changes of the thoracolumbar spine. Old appearing superior endplate height loss/compression deformity of L1 vertebral body unchanged. IMPRESSION: High-grade mid small bowel obstruction secondary to a small bowel loop within the LEFT inguinal hernia. Prostatic enlargement. Minimal infiltrate in lingula. Nonobstructing LEFT renal calculus. Electronically Signed   By: Lavonia Dana M.D.   On: 07/12/2018 20:00   Dg Abd Portable 2v  Result Date: 07/14/2018 CLINICAL DATA:  Follow-up small bowel obstruction. EXAM: PORTABLE ABDOMEN - 2 VIEW COMPARISON:  07/13/2018 and earlier, including CT abdomen and pelvis 07/12/2018. FINDINGS: Significant improvement in the small bowel obstruction since yesterday, with few air distended small bowel loops in the LEFT UPPER QUADRANT. Gas and stool throughout normal caliber colon from cecum to rectum. No evidence of free intraperitoneal air on the [employer]. Nasogastric to tip projects over the distal body of the stomach. IMPRESSION: Significant improvement in the small bowel obstruction since yesterday. Electronically Signed   By: Evangeline Dakin M.D.   On: 07/14/2018 07:52   Dg Abd Portable 2v  Result Date: 07/13/2018 CLINICAL DATA:  Small bowel obstruction EXAM: PORTABLE ABDOMEN - 2 VIEW COMPARISON:  CT scan July 12, 2018 FINDINGS: The NG tube terminates in the stomach. Mildly dilated loops of small bowel remain. IMPRESSION: The NG tube terminates in the stomach. Mildly dilated loops of small bowel remain. Electronically Signed   By: Dorise Bullion III M.D   On: 07/13/2018 08:18    EKG:   Orders placed or performed during the hospital encounter of 07/12/18  . EKG 12-Lead  . EKG 12-Lead    ASSESSMENT AND PLAN:    Acute delirium likely secondary to advanced age, postoperative state,  UTI: Continue IV fluids, treat the UTI, urine culture with Staphylococcus aureus, sensitivity pending, on IV Rocephin avoid narcotics if possible  baseline dementia with cognitive impairment as per the charts, continue Aricept.  -incarcerated left inguinal hernia status post repair by surgery 07/12/2018 patient also has small bowel obstruction, getting NG tube to suction, now tolerating clear liquid diet., IV fluids.  -History of BPH, bladder outlet obstruction, patient followed by Dr. Eliberto Ivory, status post laser surgery for the prostate recently.  Patient also noted to have adenocarcinoma of the prostate.     All the records are reviewed and case discussed with Care Management/Social Workerr.  CODE STATUS: fc   TOTAL TIME TAKING CARE OF THIS PATIENT: 3 3 minutes.     Note: This dictation was prepared with Dragon dictation along with smaller phrase technology. Any transcriptional errors that result from this process are unintentional.   Nicholes Mango M.D on 07/14/2018 at 2:22 PM  Between 7am to 6pm - Pager - [phone] After 6pm go to [email or web address] - password [email or web address] [facility]  [place] [facility]  Office  [phone]  CC: Primary care physician; Albina Billet, MD

## 2018-07-15 LAB — URINE CULTURE: Culture: >=100000 — AB

## 2018-07-15 MED ORDER — CEPHALEXIN 500 MG PO CAPS
500.0000 mg | ORAL_CAPSULE | Freq: Three times a day (TID) | ORAL | Status: DC
  Administered 2018-07-16: 500 mg via ORAL
  Filled 2018-07-15: qty 1

## 2018-07-15 MED ORDER — TIMOLOL MALEATE 0.5 % OP SOLN
1.0000 [drp] | Freq: Two times a day (BID) | OPHTHALMIC | Status: DC
  Administered 2018-07-15 – 2018-07-17 (×4): 1 [drp] via OPHTHALMIC
  Filled 2018-07-15: qty 5

## 2018-07-15 MED ORDER — BRIMONIDINE TARTRATE-TIMOLOL 0.2-0.5 % OP SOLN
1.0000 [drp] | Freq: Two times a day (BID) | OPHTHALMIC | Status: DC
  Filled 2018-07-15: qty 5

## 2018-07-15 MED ORDER — DONEPEZIL HCL 5 MG PO TABS
10.0000 mg | ORAL_TABLET | Freq: Every day | ORAL | Status: DC
  Administered 2018-07-15 – 2018-07-16 (×2): 10 mg via ORAL
  Filled 2018-07-15 (×3): qty 2

## 2018-07-15 MED ORDER — BRIMONIDINE TARTRATE 0.2 % OP SOLN
1.0000 [drp] | Freq: Two times a day (BID) | OPHTHALMIC | Status: DC
  Administered 2018-07-15 – 2018-07-17 (×4): 1 [drp] via OPHTHALMIC
  Filled 2018-07-15: qty 5

## 2018-07-15 NOTE — Progress Notes (Signed)
07/15/2018  Subjective: Patient is 3 Days Post-Op incarcerated left inguinal hernia repair.  No acute events.  NG tube removed and started on clears which he's tolerated.  Denies any nausea and continues with flatus.    Vital signs: Temp:  [97.8 F (36.6 C)-98.2 F (36.8 C)] 97.8 F (36.6 C) (12/28 0416) Pulse Rate:  [61-66] 61 (12/28 0416) Resp:  [17-20] 18 (12/28 0416) BP: (117-151)/(62-79) 132/79 (12/28 0416) SpO2:  [98 %-100 %] 98 % (12/28 0416)   Intake/Output: 12/27 0701 - 12/28 0700 In: 579.8 [P.O.:480; IV Piggyback:99.8] Out: 200 [Urine:200] Last BM Date: 07/13/18  Physical Exam: Constitutional: No acute distress Abdomen:  Soft, nondistended, appropriately tender to palpation.  Incision is clean, dry, intact, with some swelling at the incision site, expected after surgery.  No evidence of infection.  Labs:  Recent Labs    07/13/18 0453 07/14/18 0437  WBC 12.7* 5.3  HGB 12.0* 10.5*  HCT 36.9* 33.9*  PLT 240 148*   Recent Labs    07/14/18 0437 07/14/18 0803  NA 144 143  K 3.8 4.1  CL 115* 114*  CO2 26 25  GLUCOSE 99 104*  BUN 15 14  CREATININE 0.87 0.83  CALCIUM 8.1* 8.1*   No results for input(s): LABPROT, INR in the last 72 hours.  Imaging: No results found.  Assessment/Plan: This is a 84 y.o. male s/p incarcerated left inguinal hernia repair.  --advance diet today to soft diet. --if doing well could potentially go home today vs tomorrow.  Given his memory issues, it's unclear if he's had flatus today or not, but abdomen is not distended. --continue rocephin for UTI, pending susceptibilities on culture.   Melvyn Neth, [facility]

## 2018-07-15 NOTE — Progress Notes (Addendum)
[facility] at [place] NAME: Jeffery [name]    MR#:  [id number]  DATE OF BIRTH:  24-Jan-1931  SUBJECTIVE:   More awake  REVIEW OF SYSTEMS:  Review of system unobtainable as the patient is altered  DRUG ALLERGIES:  No Known Allergies  VITALS:  Blood pressure (!) 141/72, pulse 66, temperature 97.8 F (36.6 C), temperature source Oral, resp. rate 15, height 5\' 10"  (1.778 m), weight 68 kg, SpO2 99 %.  PHYSICAL EXAMINATION:  GENERAL:  84 y.o.-year-old patient lying in the bed with no acute distress.  EYES: Pupils equal, round, reactive to light and accommodation. No scleral icterus. Extraocular muscles intact.  HEENT: Head atraumatic, normocephalic. Oropharynx and nasopharynx clear.  NECK:  Supple, no jugular venous distention. No thyroid enlargement, no tenderness.  LUNGS: Normal breath sounds bilaterally, no wheezing, rales,rhonchi or crepitation. No use of accessory muscles of respiration.  CARDIOVASCULAR: S1, S2 normal. No murmurs, rubs, or gallops.  ABDOMEN: Soft, nontender, nondistended. Bowel sounds present.  EXTREMITIES: No pedal edema, cyanosis, or clubbing.  NEUROLOGIC: Awake, alert and oriented x1 sensation intact. Gait not checked.  PSYCHIATRIC: The patient is alert and oriented x 1 SKIN: No obvious rash, lesion, or ulcer.    LABORATORY PANEL:   CBC Recent Labs  Lab 07/14/18 0437  WBC 5.3  HGB 10.5*  HCT 33.9*  PLT 148*   ------------------------------------------------------------------------------------------------------------------  Chemistries  Recent Labs  Lab 07/12/18 1939 07/13/18 0453  07/14/18 0803  NA 139 140   < > 143  K 3.9 4.4   < > 4.1  CL 104 109   < > 114*  CO2 29 25   < > 25  GLUCOSE 152* 157*   < > 104*  BUN 21 19   < > 14  CREATININE 0.81 0.79   < > 0.83  CALCIUM 9.0 8.5*   < > 8.1*  MG  --  2.2  --   --   AST 14*  --   --   --   ALT 11  --   --   --   ALKPHOS 79  --   --   --    BILITOT 1.0  --   --   --    < > = values in this interval not displayed.   ------------------------------------------------------------------------------------------------------------------  Cardiac Enzymes No results for input(s): TROPONINI in the last 168 hours. ------------------------------------------------------------------------------------------------------------------  RADIOLOGY:  Dg Abd Portable 2v  Result Date: 07/14/2018 CLINICAL DATA:  Follow-up small bowel obstruction. EXAM: PORTABLE ABDOMEN - 2 VIEW COMPARISON:  07/13/2018 and earlier, including CT abdomen and pelvis 07/12/2018. FINDINGS: Significant improvement in the small bowel obstruction since yesterday, with few air distended small bowel loops in the LEFT UPPER QUADRANT. Gas and stool throughout normal caliber colon from cecum to rectum. No evidence of free intraperitoneal air on the [employer]. Nasogastric to tip projects over the distal body of the stomach. IMPRESSION: Significant improvement in the small bowel obstruction since yesterday. Electronically Signed   By: Evangeline Dakin M.D.   On: 07/14/2018 07:52    EKG:   Orders placed or performed during the hospital encounter of 07/12/18  . EKG 12-Lead  . EKG 12-Lead    ASSESSMENT AND PLAN:   *Acute metabolic encephalopathy over baseline dementia. Due to acute illness and UTI  * MSSA UTI likely due to recent catheterization On IV ceftriaxone.  Will change to Keflex.  * Baseline dementia with cognitive impairment as per  the charts, continue Aricept.  * Incarcerated left inguinal hernia status post repair by surgery 07/12/2018 patient also had small bowel obstruction NG tube removed.  Started on a diet.  * History of BPH, bladder outlet obstruction, patient followed by Dr. Eliberto Ivory, status post laser surgery for the prostate recently.  Patient also noted to have adenocarcinoma of the prostate.  All the records are reviewed and case discussed with Care  Management/Social Workerr.  CODE STATUS:  FULL CODE  TOTAL TIME TAKING CARE OF THIS PATIENT: 30 minutes.   Note: This dictation was prepared with Dragon dictation along with smaller phrase technology. Any transcriptional errors that result from this process are unintentional.   Neita Carp M.D on 07/15/2018 at 2:59 PM  Between 7am to 6pm - Pager - [phone]  After 6pm go to [email or web address] - password [email or web address] [facility]  [place] [facility]  Office  [phone]  CC: Primary care physician; Albina Billet, MD

## 2018-07-16 MED ORDER — CEPHALEXIN 500 MG PO CAPS
500.0000 mg | ORAL_CAPSULE | Freq: Three times a day (TID) | ORAL | Status: AC
  Administered 2018-07-16 – 2018-07-17 (×4): 500 mg via ORAL
  Filled 2018-07-16 (×4): qty 1

## 2018-07-16 NOTE — Clinical Social Work Note (Signed)
CSW has sent a referral for SNF per [facility] family meeting as the patient may not be safe to return home without significant supports. Per the family meeting, the family may be able to attend to the patient's needs until his wife is released from the hospital; however, they are concerned that they will not be able to manage his care as fully as needed. The CSW will follow with the [facility] team to assist with transition of care at discharge to the venue of the patient's choice.   Santiago Bumpers, MSW, Latanya Presser [phone]

## 2018-07-16 NOTE — Care Management Note (Signed)
Case Management Note  Patient Details  Name: Jeffery [name] MRN: [id number] Date of Birth: 1931/05/16  Subjective/Objective:   RNCM has been following patient for disposition planning. Patient is demented at baseline and his spouse provides all supports.I had lengthy meeting today with patients spouse (patient in room 69) niece and cousins. Per our conversations patient is able to do most of his activities of daily living but due to the dementia needs encouragement to eat and assistance with things to provide safety. Most family and supports do not live locally except a cousin Michelene Heady who I spoke with today. PT rec SNF but the patient was able to walk 500 feet without any DME. The family and wife tell me his mobility and strength are not an issue. The issue is that the patient will likely be discharged tomorrow and the spouse will not be there to care for him. They are agreeable to home health once the wife gets home and they can assist her with his care during her recovery. We talked about the likelihood of a medicare denial for SNF since the patients strength is not an issue. They would like him to get rehab for "3 or 4 days" so they can monitor the patient until the wife is out of the hospital and healthy enough to care for him. I did talk briefly to the social work team about the disposition because the family mentions potentially paying out of pocket for a few days worth of rehab if that's what's needed during the wifes recovery. Ultimately, I placed a referral with [facility] upon family's request and they are working out arrangements to potentially have [facility] stay with the patient until spouse is discharged and having support from home health. Family was very understanding on the uniqueness of the situation and ultimately agreeable to home health but still want to have  A "dollar figure" for out of pocket pricing for rehab if things cannot be worked out. RNCM team will assist with transition of care  planning.                Action/Plan:   Expected Discharge Date:  07/15/18               Expected Discharge Plan:  [place]  In-House Referral:     Discharge planning Services  CM Consult  Post Acute Care Choice:  Home Health Choice offered to:     DME Arranged:    DME Agency:     HH Arranged:    [place] Agency:  [place]  Status of Service:     If discussed at [employer], dates discussed:    Additional Comments:  Latanya Maudlin, RN 07/16/2018, 3:53 PM

## 2018-07-16 NOTE — NC FL2 (Signed)
[place] LEVEL OF CARE SCREENING TOOL     IDENTIFICATION  Patient Name: Jeffery [name] Birthdate: [birth year] Sex: male Admission Date (Current Location): 07/12/2018  [place] and [place] Number:  Engineering geologist and Address:  [facility], [address], [place], [place] [address]      Provider Number: [id number]  Attending Physician Name and Address:  Jules Husbands, MD  Relative Name and Phone Number:  Letona, [place] Spouse [phone]      Current Level of Care: Hospital Recommended Level of Care: [place] Prior Approval Number:    Date Approved/Denied:   PASRR Number: [id number]  Discharge Plan: SNF    Current Diagnoses: Patient Active Problem List   Diagnosis Date Noted  . Incarcerated inguinal hernia 07/12/2018  . Right inguinal hernia 11/25/2016  . Empyema lung ([place])   . Pleural effusion on right   . Pleural effusion, bacterial   . Empyema ([place]) 06/17/2016  . Impingement syndrome of shoulder region 02/25/2016  . Shoulder pain 02/25/2016  . Subacromial impingement 02/25/2016  . Mixed hyperlipidemia 04/01/2015  . Murmur, cardiac 04/01/2015    Orientation RESPIRATION BLADDER Height & Weight     Self, Time, Situation, Place  Normal Continent Weight: 150 lb (68 kg) Height:  5\' 10"  (177.8 cm)  BEHAVIORAL SYMPTOMS/MOOD NEUROLOGICAL BOWEL NUTRITION STATUS      Continent Diet(Soft)  AMBULATORY STATUS COMMUNICATION OF NEEDS Skin   Extensive Assist Verbally Surgical wounds                       Personal Care Assistance Level of Assistance  Bathing, Feeding, Dressing Bathing Assistance: Limited assistance Feeding assistance: Limited assistance Dressing Assistance: Limited assistance     Functional Limitations Info  Sight, Hearing, Speech Sight Info: Adequate Hearing Info: Adequate Speech Info: Adequate    SPECIAL CARE FACTORS FREQUENCY  PT (By licensed PT), OT (By licensed OT)      PT Frequency: Up to 5X per week OT Frequency: Up to 3X per week            Contractures Contractures Info: Not present    Additional Factors Info  Code Status, Allergies Code Status Info: Full Allergies Info: No Known Allergies           Current Medications (07/16/2018):  This is the current hospital active medication list Current Facility-Administered Medications  Medication Dose Route Frequency Provider Last Rate Last Dose  . acetaminophen (TYLENOL) tablet 1,000 mg  1,000 mg Oral Q6H Pabon, Diego F, MD   1,000 mg at 07/16/18 1414  . brimonidine (ALPHAGAN) 0.2 % ophthalmic solution 1 drop  1 drop Both Eyes Q12H Pabon, Diego F, MD   1 drop at 07/16/18 1109   And  . timolol (TIMOPTIC) 0.5 % ophthalmic solution 1 drop  1 drop Both Eyes Q12H Pabon, Diego F, MD   1 drop at 07/16/18 1110  . cephALEXin (KEFLEX) capsule 500 mg  500 mg Oral Q8H Sudini, Srikar, MD   500 mg at 07/16/18 1414  . donepezil (ARICEPT) tablet 10 mg  10 mg Oral QHS Piscoya, Jose, MD   10 mg at 07/15/18 2140  . heparin injection 5,000 Units  5,000 Units Subcutaneous Q8H Pabon, Clyde F, MD   5,000 Units at 07/16/18 1414  . hydrALAZINE (APRESOLINE) injection 10 mg  10 mg Intravenous Q2H PRN Pabon, Diego F, MD      . ketorolac (TORADOL) 15 MG/ML injection 15 mg  15 mg Intravenous Q6H Pabon, Diego F, MD   15 mg at 07/16/18 0650  . morphine 4 MG/ML injection 2 mg  2 mg Intravenous Q3H PRN Pabon, Diego F, MD      . ondansetron (ZOFRAN-ODT) disintegrating tablet 4 mg  4 mg Oral Q6H PRN Pabon, Diego F, MD       Or  . ondansetron (ZOFRAN) injection 4 mg  4 mg Intravenous Q6H PRN Pabon, Diego F, MD      . oxyCODONE (Oxy IR/ROXICODONE) immediate release tablet 5 mg  5 mg Oral Q6H PRN Pabon, Diego F, MD      . pantoprazole (PROTONIX) injection 40 mg  40 mg Intravenous QHS Caroleen Hamman F, MD   40 mg at 07/15/18 2143  . prochlorperazine (COMPAZINE) tablet 10 mg  10 mg Oral Q6H PRN Pabon, Diego F, MD       Or  .  prochlorperazine (COMPAZINE) injection 5-10 mg  5-10 mg Intravenous Q6H PRN Pabon, Diego F, MD      . tamsulosin (FLOMAX) capsule 0.4 mg  0.4 mg Oral QPC supper Pabon, [place] F, MD   0.4 mg at 07/15/18 1801     Discharge Medications: Please see discharge summary for a list of discharge medications.  Relevant Imaging Results:  Relevant Lab Results:   Additional Information [id number]  Zettie Pho, LCSW

## 2018-07-16 NOTE — Progress Notes (Signed)
[place] Hospital Day(s): 4.   Post op day(s): 4 Days Post-Op.   Interval History: Patient seen and examined, no acute events or new complaints overnight. Patient reports had a good bowel movement this morning. Refers tolerated diet.   Vital signs in last 24 hours: [min-max] current  Temp:  [97.6 F (36.4 C)-98.1 F (36.7 C)] 97.6 F (36.4 C) (12/29 0414) Pulse Rate:  [64-69] 64 (12/29 0414) Resp:  [15-16] 16 (12/29 0414) BP: (94-146)/(62-72) 146/71 (12/29 0414) SpO2:  [96 %-99 %] 96 % (12/29 0414)     Height: 5\' 10"  (177.8 cm) Weight: 68 kg BMI (Calculated): 21.52    Physical Exam:  Constitutional: alert, cooperative and no distress  Respiratory: breathing non-labored at rest  Cardiovascular: regular rate and sinus rhythm  Gastrointestinal: soft, non-tender, and non-distended. Left inguinal wound dry and clean with expected swelling. No hematoma or fluid collection.   Labs:  CBC Latest Ref Rng & Units 07/14/2018 07/13/2018 07/12/2018  WBC 4.0 - 10.5 K/uL 5.3 12.7(H) 15.4(H)  Hemoglobin 13.0 - 17.0 g/dL 10.5(L) 12.0(L) 13.3  Hematocrit 39.0 - 52.0 % 33.9(L) 36.9(L) 41.3  Platelets 150 - 400 K/uL 148(L) 240 283   CMP Latest Ref Rng & Units 07/14/2018 07/14/2018 07/13/2018  Glucose 70 - 99 mg/dL 104(H) 99 157(H)  BUN 8 - 23 mg/dL 14 15 19   Creatinine 0.61 - 1.24 mg/dL 0.83 0.87 0.79  Sodium 135 - 145 mmol/L 143 144 140  Potassium 3.5 - 5.1 mmol/L 4.1 3.8 4.4  Chloride 98 - 111 mmol/L 114(H) 115(H) 109  CO2 22 - 32 mmol/L 25 26 25   Calcium 8.9 - 10.3 mg/dL 8.1(L) 8.1(L) 8.5(L)  Total Protein 6.5 - 8.1 g/dL - - -  Total Bilirubin 0.3 - 1.2 mg/dL - - -  Alkaline Phos 38 - 126 U/L - - -  AST 15 - 41 U/L - - -  ALT 0 - 44 U/L - - -    Imaging studies: No new pertinent imaging studies   Assessment/Plan:  84 y.o. male with left incarcerated inguinal hernia with small bowel obstruction 4 Days Post-Op s/p inguinal hernia repair.  Patient today doing  better. Had bowel movement, ambulating, pain control. Needs skill nursing facility for post op care since care taker is hospitalized. Agree with Hospitalist for treatment of UTI.   Arnold Long, MD

## 2018-07-16 NOTE — Progress Notes (Signed)
[place] [facility] [place] NAME: Jeffery [name]    MR#:  [id number]  DATE OF BIRTH:  11/15/1930  SUBJECTIVE:   More awake  REVIEW OF SYSTEMS:  Review of system unobtainable as the patient is altered  DRUG ALLERGIES:  No Known Allergies  VITALS:  Blood pressure (!) 146/71, pulse 64, temperature 97.6 F (36.4 C), temperature source Oral, resp. rate 16, height 5\' 10"  (1.778 m), weight 68 kg, SpO2 96 %.  PHYSICAL EXAMINATION:  GENERAL:  84 y.o.-year-old patient lying in the bed with no acute distress.  EYES: Pupils equal, round, reactive to light and accommodation. No scleral icterus. Extraocular muscles intact.  HEENT: Head atraumatic, normocephalic. Oropharynx and nasopharynx clear.  NECK:  Supple, no jugular venous distention. No thyroid enlargement, no tenderness.  LUNGS: Normal breath sounds bilaterally, no wheezing, rales,rhonchi or crepitation. No use of accessory muscles of respiration.  CARDIOVASCULAR: S1, S2 normal. No murmurs, rubs, or gallops.  ABDOMEN: Soft, nontender, nondistended. Bowel sounds present.  EXTREMITIES: No pedal edema, cyanosis, or clubbing.  NEUROLOGIC: Awake, alert and oriented x1 sensation intact. Gait not checked.  PSYCHIATRIC: The patient is alert and oriented x 1 SKIN: No obvious rash, lesion, or ulcer.    LABORATORY PANEL:   CBC Recent Labs  Lab 07/14/18 0437  WBC 5.3  HGB 10.5*  HCT 33.9*  PLT 148*   ------------------------------------------------------------------------------------------------------------------  Chemistries  Recent Labs  Lab 07/12/18 1939 07/13/18 0453  07/14/18 0803  NA 139 140   < > 143  K 3.9 4.4   < > 4.1  CL 104 109   < > 114*  CO2 29 25   < > 25  GLUCOSE 152* 157*   < > 104*  BUN 21 19   < > 14  CREATININE 0.81 0.79   < > 0.83  CALCIUM 9.0 8.5*   < > 8.1*  MG  --  2.2  --   --   AST 14*  --   --   --   ALT 11  --   --   --   ALKPHOS 79  --   --   --    BILITOT 1.0  --   --   --    < > = values in this interval not displayed.   ------------------------------------------------------------------------------------------------------------------  Cardiac Enzymes No results for input(s): TROPONINI in the last 168 hours. ------------------------------------------------------------------------------------------------------------------  RADIOLOGY:  No results found.  EKG:   Orders placed or performed during the hospital encounter of 07/12/18  . EKG 12-Lead  . EKG 12-Lead    ASSESSMENT AND PLAN:   *Acute metabolic encephalopathy over baseline dementia. Resolved Due to acute illness and UTI  * MSSA UTI likely due to recent catheterization On IV ceftriaxone.   Changed to keflex. Will stop after tomorrows dose.  * Baseline dementia with cognitive impairment as per the charts, continue Aricept.  * Incarcerated left inguinal hernia status post repair by surgery 07/12/2018 patient also had small bowel obstruction NG tube removed.  Started on a diet. And doing well  * History of BPH, bladder outlet obstruction, patient followed by Dr. Eliberto Ivory, status post laser surgery for the prostate recently.  Patient also noted to have adenocarcinoma of the prostate.  WIll sign off. Please call with questions  All the records are reviewed and case discussed with Care Management/Social Worker  CODE STATUS:  FULL CODE  TOTAL TIME TAKING CARE OF THIS PATIENT: 30 minutes.  Note: This dictation was prepared with Dragon dictation along with smaller phrase technology. Any transcriptional errors that result from this process are unintentional.   Neita Carp M.D on 07/16/2018 at 11:48 AM  Between 7am to 6pm - Pager - [phone]  After 6pm go to [email or web address] - password [email or web address] [facility]  [facility]  Office  [phone]  CC: Primary care physician; Albina Billet, MD

## 2018-07-17 LAB — CBC
HEMATOCRIT: 34.4 % — AB (ref 39.0–52.0)
Hemoglobin: 11.2 g/dL — ABNORMAL LOW (ref 13.0–17.0)
MCH: 28.9 pg (ref 26.0–34.0)
MCHC: 32.6 g/dL (ref 30.0–36.0)
MCV: 88.9 fL (ref 80.0–100.0)
Platelets: 162 10*3/uL (ref 150–400)
RBC: 3.87 MIL/uL — AB (ref 4.22–5.81)
RDW: 12.7 % (ref 11.5–15.5)
WBC: 5.5 10*3/uL (ref 4.0–10.5)
nRBC: 0 % (ref 0.0–0.2)

## 2018-07-17 MED ORDER — PANTOPRAZOLE SODIUM 40 MG PO TBEC: 40.0000 mg | DELAYED_RELEASE_TABLET | Freq: Every day | ORAL | Status: DC

## 2018-07-17 MED ORDER — IBUPROFEN 800 MG PO TABS: 800.0000 mg | ORAL_TABLET | Freq: Three times a day (TID) | ORAL | 0 refills | Status: DC | PRN

## 2018-07-17 NOTE — Discharge Instructions (Signed)
In addition to included general post-operative instructions for open left inguinal hernia repair,  Diet: Resume home heart healthy diet.   Activity: No heavy lifting >20 pounds (children, pets, laundry, garbage) or strenuous activity until follow-up, but light activity and walking are encouraged. Do not drive or drink alcohol if taking narcotic pain medications.  Wound care: You may shower/get incision wet with soapy water and pat dry (do not rub incisions), but no baths or submerging incision underwater until follow-up.   Medications: Resume all home medications. For mild to moderate pain: acetaminophen (Tylenol) or ibuprofen/naproxen (if no kidney disease). Combining Tylenol with alcohol can substantially increase your risk of causing liver disease.   Call office [phone] ) at any time if any questions, worsening pain, fevers/chills, bleeding, drainage from incision site, or other concerns.

## 2018-07-17 NOTE — Discharge Summary (Signed)
[facility] SURGICAL DISCHARGE SUMMARY  Patient ID: Jeffery [name] MRN: [id number] DOB/AGE: Dec 08, 1930 84 y.o.  Admit date: 07/12/2018 Discharge date: 07/17/2018  Discharge Diagnoses Incarcerated Left Inguinal Hernia  Consultants Medicine  Procedures Left Open Inguinal Hernia Repair   HPI: Jeffery [name] is a 84 y.o. male who presented to [facility] on 12/25 with 2 days of left inguinal pain and associated nausea and emesis. He described the pain as sharp and was constant. History was limited by history of dementia, however, work up in the ED was concerning for SBO with transition point at the left inguinal hernia which was incarcerated in addition to a mild leukocytosis.   Hospital Course: Informed consent was obtained and documented, and patient underwent uneventful left open inguinal hernia repair  (Dr Dahlia Byes, 07/12/2018).  Post-operatively, patient's pain and nausea improved/resolved and advancement of patient's diet and ambulation were well-tolerated. The remainder of patient's hospital course was essentially unremarkable, and discharge planning was initiated accordingly with patient safely able to be discharged home with appropriate discharge instructions, pain control, and outpatient follow-up after all of his questions were answered to his expressed satisfaction.  Will discharge home with [facility] and under the care of family members.   Discharge Condition: Good    Allergies as of 07/17/2018   No Known Allergies     Medication List    STOP taking these medications   ciprofloxacin 500 MG tablet Commonly known as:  CIPRO     TAKE these medications   acetaminophen-codeine 300-30 MG tablet Commonly known as:  TYLENOL #3 Take 1-2 tablets by mouth every 4 (four) hours as needed for moderate pain.   COMBIGAN 0.2-0.5 % ophthalmic solution Generic drug:  brimonidine-timolol Place 1 drop into both eyes every 12 (twelve) hours.   docusate sodium 100 MG  capsule Commonly known as:  COLACE Take 2 capsules (200 mg total) by mouth 2 (two) times daily.   donepezil 10 MG tablet Commonly known as:  ARICEPT Take 10 mg by mouth at bedtime.   Fish Oil 1000 MG Caps Take 1,000 mg by mouth 4 (four) times a week.   Glucosamine-Chondroitin 500-250 MG Caps Take 1 capsule by mouth every Monday, Wednesday, and Friday.   ibuprofen 800 MG tablet Commonly known as:  ADVIL,MOTRIN Take 1 tablet (800 mg total) by mouth every 8 (eight) hours as needed.   SUPER ANTIOXIDANT PO Take 1 tablet by mouth every Monday, Wednesday, and Friday.   SUPER B-50 PO Take 1 capsule by mouth every Monday, Wednesday, and Friday.   tamsulosin 0.4 MG Caps capsule Commonly known as:  FLOMAX Take 0.4 mg by mouth daily after supper.   URIBEL 118 MG Caps Take 1 capsule (118 mg total) by mouth every 6 (six) hours as needed (dysuria).        Follow-up Information    Pabon, [place] F, MD. Schedule an appointment as soon as possible for a visit in 2 week(s).   Specialty:  General Surgery Why:  s/p left open inguinal hernia repair on 12/25 with Dr Ulyses Amor information: [address] [place] [address] [phone]            -- Edison Simon , PA-C [facility]  07/17/2018, 12:36 PM [phone] M-F: 7am - 4pm

## 2018-07-17 NOTE — Care Management Important Message (Signed)
Copy of signed IM left with patient in room.  

## 2018-07-17 NOTE — Progress Notes (Signed)
[place] Hospital Day(s): 5.   Post op day(s): 5 Days Post-Op.   Interval History: Patient seen and examined, no acute events or new complaints overnight. Patient reports that he is feeling well and has no complaints of abdominal pain, nausea, emesis, fever, or chills. History limited given history of dementia. Chart review with 2 reported stools. Tolerating diet. Discharge planning limited currently secondary to social issues.   Review of Systems:  Constitutional: denies fever, chills  Respiratory: denies any shortness of breath  Cardiovascular: denies chest pain or palpitations  Gastrointestinal: denies abdominal pain, N/V, or diarrhea/and bowel function as per interval history   Vital signs in last 24 hours: [min-max] current  Temp:  [97.6 F (36.4 C)-98 F (36.7 C)] 97.6 F (36.4 C) (12/30 0458) Pulse Rate:  [61-69] 67 (12/30 0458) Resp:  [15-20] 20 (12/30 0458) BP: (125-152)/(75-87) 143/87 (12/30 0458) SpO2:  [98 %] 98 % (12/30 0458)     Height: 5\' 10"  (177.8 cm) Weight: 68 kg BMI (Calculated): 21.52   Intake/Output this shift:  No intake/output data recorded.   Intake/Output last 2 shifts:  @IOLAST2SHIFTS @   Physical Exam:  Constitutional: alert, cooperative and no distress  Respiratory: breathing non-labored at rest  Gastrointestinal: soft, non-tender, and non-distended, no hernia Integumentary: incision in left groin CDI, no erythema or drainage. Surrounding ecchymosis  Labs:  CBC Latest Ref Rng & Units 07/17/2018 07/14/2018 07/13/2018  WBC 4.0 - 10.5 K/uL 5.5 5.3 12.7(H)  Hemoglobin 13.0 - 17.0 g/dL 11.2(L) 10.5(L) 12.0(L)  Hematocrit 39.0 - 52.0 % 34.4(L) 33.9(L) 36.9(L)  Platelets 150 - 400 K/uL 162 148(L) 240   CMP Latest Ref Rng & Units 07/14/2018 07/14/2018 07/13/2018  Glucose 70 - 99 mg/dL 104(H) 99 157(H)  BUN 8 - 23 mg/dL 14 15 19   Creatinine 0.61 - 1.24 mg/dL 0.83 0.87 0.79  Sodium 135 - 145 mmol/L 143 144  140  Potassium 3.5 - 5.1 mmol/L 4.1 3.8 4.4  Chloride 98 - 111 mmol/L 114(H) 115(H) 109  CO2 22 - 32 mmol/L 25 26 25   Calcium 8.9 - 10.3 mg/dL 8.1(L) 8.1(L) 8.5(L)  Total Protein 6.5 - 8.1 g/dL - - -  Total Bilirubin 0.3 - 1.2 mg/dL - - -  Alkaline Phos 38 - 126 U/L - - -  AST 15 - 41 U/L - - -  ALT 0 - 44 U/L - - -    Assessment/Plan:  84 y.o. male who is overall doing well 5 Days Post-Op s/p open left inguinal hernia repair for an incarcerated left inguinal hernia, complicated by pertinent comorbidities including HLD, dementia, and advanced age.   - Continue soft diet  - Pain control as needed  - Monitor abdominal examination and on-going bowel function  - Mobilize  - DVT Prophylaxis   - Discharge Planning: Patient is clear for discharge from [facility], however, Primary Caretaker (Pt's Wife) is currently hospitalized and patient does not qualify for SNF. Discussed with CSW and will try and work with patient's family regarding discharge care.      -- Edison Simon, PA-C [facility] 07/17/2018, 9:38 AM [phone] M-F: 7am - 4pm

## 2018-07-17 NOTE — Clinical Social Work Note (Signed)
CSW spoke with patient's wife this morning at her bedside in her hospital room. Patient's wife states that she may discharge today but that they have friends coming in from [facility] today and if she does not discharge, they can take patient home today and stay with him as they are in town for several days. Patient's wife does not feel as though a strange place in a facility is the appropriate place for him.  Shela Leff MSW,LCSW [phone]

## 2018-07-17 NOTE — Care Management Note (Signed)
Case Management Note  Patient Details  Name: CALEY VOLKERT MRN: [id number] Date of Birth: April 16, 1931   Patient to discharge home today.  Wife is also discharging today as well.  Her cousin is to provide transportation at discharge.  Both will be followed by [facility].  Brad with [facility] notified of discharge.   Subjective/Objective:                    Action/Plan:   Expected Discharge Date:  07/17/18               Expected Discharge Plan:  [place]  In-House Referral:     Discharge planning Services  CM Consult  Post Acute Care Choice:  Home Health Choice offered to:  Spouse  DME Arranged:    DME Agency:     HH Arranged:  RN [place] Agency:  [facility]  Status of Service:  Completed, signed off  If discussed at [place] of Stay Meetings, dates discussed:    Additional Comments:  Beverly Sessions, RN 07/17/2018, 5:16 PM

## 2018-07-21 NOTE — Anesthesia Postprocedure Evaluation (Signed)
Anesthesia Post Note  Patient: KYDEN POTASH  Procedure(s) Performed: HERNIA REPAIR INGUINAL ADULT (Left Abdomen)  Patient location during evaluation: PACU Anesthesia Type: General Level of consciousness: awake and alert Pain management: pain level controlled Vital Signs Assessment: post-procedure vital signs reviewed and stable Respiratory status: spontaneous breathing, nonlabored ventilation, respiratory function stable and patient connected to nasal cannula oxygen Cardiovascular status: blood pressure returned to baseline and stable Postop Assessment: no apparent nausea or vomiting Anesthetic complications: no     Last Vitals:  Vitals:   07/17/18 0458 07/17/18 1129  BP: (!) 143/87 122/70  Pulse: 67 61  Resp: 20   Temp: 36.4 C 36.9 C  SpO2: 98% 97%    Last Pain:  Vitals:   07/17/18 1129  TempSrc: Oral  PainSc:                  Molli Barrows

## 2018-07-31 ENCOUNTER — Ambulatory Visit (INDEPENDENT_AMBULATORY_CARE_PROVIDER_SITE_OTHER): Payer: Medicare Other | Admitting: Surgery

## 2018-07-31 ENCOUNTER — Other Ambulatory Visit: Payer: Self-pay

## 2018-07-31 ENCOUNTER — Encounter: Payer: Self-pay | Admitting: Surgery

## 2018-07-31 VITALS — BP 130/72 | HR 65 | Temp 97.5°F | Resp 15 | Ht 70.0 in | Wt 163.0 lb

## 2018-07-31 DIAGNOSIS — Z09 Encounter for follow-up examination after completed treatment for conditions other than malignant neoplasm: Secondary | ICD-10-CM

## 2018-08-02 ENCOUNTER — Ambulatory Visit: Payer: Medicare Other | Attending: Urology | Admitting: Physical Therapy

## 2018-08-02 ENCOUNTER — Encounter: Payer: Self-pay | Admitting: Surgery

## 2018-08-02 ENCOUNTER — Encounter: Payer: Self-pay | Admitting: Physical Therapy

## 2018-08-02 ENCOUNTER — Other Ambulatory Visit: Payer: Self-pay

## 2018-08-02 DIAGNOSIS — R2689 Other abnormalities of gait and mobility: Secondary | ICD-10-CM

## 2018-08-02 DIAGNOSIS — R293 Abnormal posture: Secondary | ICD-10-CM | POA: Insufficient documentation

## 2018-08-02 DIAGNOSIS — M6281 Muscle weakness (generalized): Secondary | ICD-10-CM | POA: Diagnosis present

## 2018-08-02 NOTE — Patient Instructions (Signed)
  Avoid straining pelvic floor, abdominal muscles , spine  Use log rolling technique instead of getting out of bed with your neck or the sit-up     Log rolling into and out of bed If getting out of bed on L side, 1) Bent knees, scoot hips/ shoulder to R   Raise L arm completely overhead, rolling onto armpit  Then lower bent knees to bed to get into complete side lying position  Then drop legs off bed, and push up onto L elbow/forearm, and use R hand to push onto the bend   ______   Proper body mechanics with getting out of a chair to decrease strain  on back &pelvic floor   Avoid holding your breath when Getting out of the chair:  Scoot to front part of chair chair Heels behind feet, feet are hip width apart, nose over toes  Inhale like you are smelling roses Exhale to stand    ______  Take breaks on commercial  And walk in place for better blood flow and healing for hernia repair    Sit with feet on the ground in normal chair, less time in recliner   ______

## 2018-08-02 NOTE — Progress Notes (Signed)
S/p Left IH repair for incarceration 12/25 NO complaints Takng PO, + BM  PE NAD Abd: soft, nt, incision healing well, no infection some edema expected.  A/P doing well No complications F/U prn

## 2018-08-04 NOTE — Therapy (Addendum)
[place] MAIN [facility] SERVICES [address] [place], [place], [address] Phone: [phone]   Fax:  [phone]  Physical Therapy Evaluation  Patient Details  Name: Jeffery [name] MRN: [id number] Date of Birth: [birth year] Referring Provider (PT): Boneta Lucks    Encounter Date: 08/02/2018    Past Medical History:  Diagnosis Date  . Does use hearing aid   . Empyema lung ([place]) 06/03/2016   MODERATE WBC PRESENT, PREDOMINANTLY PMN, STREP INTERMEDIUS  . Hyperlipidemia   . Pneumonia Nov/Dec 2017    Past Surgical History:  Procedure Laterality Date  . APPENDECTOMY  1971  . COLONOSCOPY  2014  . EYE SURGERY     cataract surgery   . GREEN LIGHT LASER TURP (TRANSURETHRAL RESECTION OF PROSTATE N/A 06/20/2018   Procedure: GREEN LIGHT LASER TURP (TRANSURETHRAL RESECTION OF PROSTATE;  Surgeon: Royston Cowper, MD;  Location: [facility] ORS;  Service: Urology;  Laterality: N/A;  . HERNIA REPAIR Left 2008  . INGUINAL HERNIA REPAIR Right 12/01/2016   Right inguinal hernia repair with medium Ultra Pro mesh.  . INGUINAL HERNIA REPAIR Left 07/12/2018   Procedure: HERNIA REPAIR INGUINAL ADULT;  Surgeon: Jules Husbands, MD;  Location: [facility] ORS;  Service: General;  Laterality: Left;  . INSERTION OF MESH Right 12/01/2016   Procedure: INSERTION OF MESH;  Surgeon: Robert Bellow, MD;  Location: [facility] ORS;  Service: General;  Laterality: Right;  . TONSILLECTOMY  1944    There were no vitals filed for this visit.   Subjective Assessment - 08/04/18 0102    Subjective  Urinary leakage:  Pt reports "leakage all the time" since his prostate surgery ( medical records indicated green light laser procedure on 06/20/18).  Pt wore a catheter for one night and it was removed by pt because pt did not know how to empty the catheter. Pt returned to the clinic and did not receive another catheter.    Pt had no leakage prior to his procedure. Pt started to wear padded pants which he  changes 3 x day. Leakage occurs more with getting from sitting.  Two weeks after the prostate surgery, pt was vomitting violently and hiccuping which lead him tot he ER. Pt's wife was told by the surgeon that there was a hernia blocking his intenstine.  ( Surgeon Dr. Lorenz Coaster) Pt remained at the hospital for 4 nights  07/12/18 - 07/17/18 . The hernia was repaired. Vomitting and hiccuping resolved. Leakage still remains. The surgeon educated pt on making sure to keep the hernia repair  ( L)  from get wet from urine in the padded pants. Pt no longer gets up at night to urinate which is a change within the last month. Pt used to get 2-3x night to urinate prior to this change.  Pt has regular bowel movements everyday. Daily water intake 6-8 glasses, zero teas / soda, 1-2 cups of coffee. Pt reported he does not perform any repeated bending, heavy lifting.        Patient is accompained by:  Family member   wife   Pertinent History  Hx inguinal hernial repair B, appendix removed           [facility] PT Assessment - 08/04/18 0049      Assessment   Medical Diagnosis  urinary incontinence     Referring Provider (PT)  Boneta Lucks       Precautions   Precautions  None      Restrictions   Weight Bearing Restrictions  No      Balance Screen   Has the patient fallen in the past 6 months  No      Prior Function   Level of Independence  Independent      Observation/Other Assessments   Observations  slumped posture      Coordination   Gross Motor Movements are Fluid and Coordinated  --   diaphragm excursion with cue for inhale, mild chest breathing.  Overuse of abdominal mm and gluts/ adductors with cue for pelvic floor mm.      Sit to Stand   Comments  straining , breathholding       Strength   Overall Strength Comments  hip/knee flexion knee ext 3+/5 L,  3/5 R        Palpation   SI assessment   levelled iliac crest     Palpation comment  swelling around L inguinal scar incision with well healed  scar        Ambulation/Gait   Gait Comments  slowed gait , plan to measure speed next session                 Objective measurements completed on examination: See above findings.      [place] Adult PT Treatment/Exercise - 08/04/18 0049      Bed Mobility   Bed Mobility  --   head lift/ spinal flexion     Neuro Re-ed    Neuro Re-ed Details   see pt instructions, proper body mechanics to minimize straining abdominopelvic and spine              PT Long Term Goals - 08/02/18 1131      PT LONG TERM GOAL #1   Title  Pt will decrease padded pants from 3 x day to 1x day in order to promote hygiene and QOL     Time  10    Period  Weeks    Status  New    Target Date  10/11/18      PT LONG TERM GOAL #2   Title  Pt will demo decrease abdominal scar restrictions over L lower quadrant in order to promote improved deep core coordination and strength for continence    Time  6    Period  Weeks    Status  New    Target Date  09/15/18      PT LONG TERM GOAL #3   Title  Pt wil demo proper body mechanics to minimize straining pelvic floor and co-activation technique fwith exhalation twith sit to stand to minimize leakage    Time  2    Period  Weeks    Status  New    Target Date  08/18/18      PT LONG TERM GOAL #4   Title  Pt will demo proper pelvic floor strength 3/3/3/3 in order to decrease urinary pads    Time  8    Period  Weeks    Status  New    Target Date  09/29/18      PT LONG TERM GOAL #5   Title  Pt will decrease nocturia form 2-3x  night to 1x night in order to improve QOL    Time  10    Period  Weeks    Status  New    Target Date  10/21/18                 Plan - 08/04/18 0056    Clinical  Impression Statement  Pt is an 85 yo male who complains of urinary leakage following  green light laser procedure on 06/20/18. Pt also had an ER visit two weeks after this surgery for a hernia repair with a hospital stay 07/12/18 - 07/17/18 .  Pelvic PT will be  beneficial for pt to regain continence and to learn proper ways to not strain his abdominopelvic area to minimize worsening of hernia and optimize hernial repair.  Pt's clinical presentations dyscoordination and strength of pelvic floor mm,), poor body mechanics which places strain on the abdominal/pelvic floor mm, weak hip weakness,abdominal scar restrictions ( L LQ hernia scar is well healed but swelling is present). . These are deficits that indicate an ineffective intraabdominal pressure system associated with increased risk for urinary leakage. Pt was provided education on etiology of Sx with anatomy, physiology explanation with images along with the benefits of customized pelvic PT Tx based on pt's medical conditions and musculoskeletal deficits. Following today's Tx, pt demo'd proper co-activation of pelvic floor with deep core with exhalation during sit to stand and log rolling technique in supine to sit to minimize straining abdominal area to optimize hernia repair.      Clinical Presentation  Evolving    Clinical Decision Making  Moderate    Rehab Potential  Good    PT Frequency  1x / week    PT Duration  Other (comment)   10   PT Treatment/Interventions  Gait training;Moist Heat;Therapeutic activities;Therapeutic exercise;Manual techniques;Neuromuscular re-education;Scar mobilization;Taping;Stair training;Functional mobility training;Patient/family education    Consulted and Agree with Plan of Care  Patient       Patient will benefit from skilled therapeutic intervention in order to improve the following deficits and impairments:  Improper body mechanics, Decreased scar mobility, Decreased range of motion, Decreased strength, Increased fascial restricitons, Decreased balance, Decreased mobility, Decreased endurance, Increased muscle spasms, Hypomobility, Postural dysfunction, Pain, Decreased safety awareness, Abnormal gait  Visit Diagnosis: Other abnormalities of gait and  mobility  Muscle weakness (generalized)  Abnormal posture     Problem List Patient Active Problem List   Diagnosis Date Noted  . Incarcerated inguinal hernia 07/12/2018  . Right inguinal hernia 11/25/2016  . Empyema lung ([place])   . Pleural effusion on right   . Pleural effusion, bacterial   . Empyema ([place]) 06/17/2016  . Impingement syndrome of shoulder region 02/25/2016  . Shoulder pain 02/25/2016  . Subacromial impingement 02/25/2016  . Mixed hyperlipidemia 04/01/2015  . Murmur, cardiac 04/01/2015    Jerl Mina ,PT, DPT, E-RYT  08/04/2018, 1:05 AM  [place] MAIN [facility] SERVICES [address] [place], [place], [address] Phone: [phone]   Fax:  [phone]  Name: Jeffery [name] MRN: [id number] Date of Birth: [birth year]

## 2018-08-10 ENCOUNTER — Ambulatory Visit: Payer: Medicare Other | Admitting: Physical Therapy

## 2018-08-10 DIAGNOSIS — R2689 Other abnormalities of gait and mobility: Secondary | ICD-10-CM | POA: Diagnosis not present

## 2018-08-10 DIAGNOSIS — R293 Abnormal posture: Secondary | ICD-10-CM

## 2018-08-10 DIAGNOSIS — M6281 Muscle weakness (generalized): Secondary | ICD-10-CM

## 2018-08-10 NOTE — Patient Instructions (Signed)
PELVIC FLOOR / KEGEL EXERCISES   Pelvic floor/ Kegel exercises are used to strengthen the muscles in the base of your pelvis that are responsible for supporting your pelvic organs and preventing urine/feces leakage. Based on your therapist's recommendations, they can be performed while standing, sitting, or lying down.  Make yourself aware of this muscle group by using these cues:  Imagine you are in a crowded room and you feel the need to pass gas. Your response is to pull up and in at the rectum.  Close the rectum. Pull the muscles up inside your body,feeling your scrotum lifting as well . Feel the pelvic floor muscles lift as if you were walking into a cold lake.  Place your hand on top of your pubic bone. Tighten and draw in the muscles around the anal muscles without squeezing the buttock muscles.  Common Errors:  Breath holding: If you are holding your breath, you may be bearing down against your bladder instead of pulling it up. If you belly bulges up while you are squeezing, you are holding your breath. Be sure to breathe gently in and out while exercising. Counting out loud may help you avoid holding your breath.  Accessory muscle use: You should not see or feel other muscle movement when performing pelvic floor exercises. When done properly, no one can tell that you are performing the exercises. Keep the buttocks, belly and inner thighs relaxed.  Overdoing it: Your muscles can fatigue and stop working for you if you over-exercise. You may actually leak more or feel soreness at the lower abdomen or rectum.  YOUR HOME EXERCISE PROGRAM     SHORT HOLDS: Position: on back, sitting   Inhale and then exhale. Then squeeze the muscle.  (Be sure to let belly sink in with exhales and not push outward)  Perform 10  repetitions, 6  Times/day  ( in bed in morning,  Breakfast, lunch, afternoon, dinner, in bed)   **ALSO SQUEEZE BEFORE YOUR SNEEZE, COUGH, LAUGH to decrease downward pressure    **ALSO EXHALE BEFORE YOU RISE AGAINST GRAVITY (lifting, sit to stand, from squat to stand)

## 2018-08-11 NOTE — Therapy (Signed)
[place] MAIN [facility] SERVICES [address] [place], [place], [address] Phone: [phone]   Fax:  [phone]  Physical Therapy Treatment  Patient Details  Name: Jeffery [name] MRN: [id number] Date of Birth: [birth year] Referring Provider (PT): Boneta Lucks    Encounter Date: 08/10/2018  PT End of Session - 08/11/18 1725    Visit Number  2    Number of Visits  10    Date for PT Re-Evaluation  10/11/18    Authorization Type  [employer]     PT Start Time  1400    PT Stop Time  1455    PT Time Calculation (min)  55 min    Activity Tolerance  Patient tolerated treatment well    Behavior During Therapy  [facility] for tasks assessed/performed       Past Medical History:  Diagnosis Date  . Does use hearing aid   . Empyema lung ([place]) 06/03/2016   MODERATE WBC PRESENT, PREDOMINANTLY PMN, STREP INTERMEDIUS  . Hyperlipidemia   . Pneumonia Nov/Dec 2017    Past Surgical History:  Procedure Laterality Date  . APPENDECTOMY  1971  . COLONOSCOPY  2014  . EYE SURGERY     cataract surgery   . GREEN LIGHT LASER TURP (TRANSURETHRAL RESECTION OF PROSTATE N/A 06/20/2018   Procedure: GREEN LIGHT LASER TURP (TRANSURETHRAL RESECTION OF PROSTATE;  Surgeon: Royston Cowper, MD;  Location: [facility] ORS;  Service: Urology;  Laterality: N/A;  . HERNIA REPAIR Left 2008  . INGUINAL HERNIA REPAIR Right 12/01/2016   Right inguinal hernia repair with medium Ultra Pro mesh.  . INGUINAL HERNIA REPAIR Left 07/12/2018   Procedure: HERNIA REPAIR INGUINAL ADULT;  Surgeon: Jules Husbands, MD;  Location: [facility] ORS;  Service: General;  Laterality: Left;  . INSERTION OF MESH Right 12/01/2016   Procedure: INSERTION OF MESH;  Surgeon: Robert Bellow, MD;  Location: [facility] ORS;  Service: General;  Laterality: Right;  . TONSILLECTOMY  1944    There were no vitals filed for this visit.  Subjective:  Pt's wife reported he is now sitting less in recliner and more time in an upright  chair    [facility] PT Assessment - 08/11/18 1721      Coordination   Gross Motor Movements are Fluid and Coordinated  --   mod cues for less chest breathing in supine/ seated     Sit to Stand   Comments  good caryr over with sit to stand with minor cues , less breathholding       Palpation   Palpation comment  increased scar restriction on R LQ abdomen, L scar well healed, less swelling compared to last week      Bed Mobility   Bed Mobility  --   required cues , reviewing log rolling, not lifting head                Pelvic Floor Special Questions - 08/11/18 1722    External Perineal Exam  with undergarment donned:  overuse of abdominal mm when cued for pelvic floor. Pt required excessive cues to properly not involve accessory mm.          [place] Adult PT Treatment/Exercise - 08/11/18 1721      Therapeutic Activites    Therapeutic Activities  --   cued for proper sit to stand, log rolling to minimize strain     Neuro Re-ed    Neuro Re-ed Details   cued for proper deep core, less  chest breathing, less overuse of abdomen. glut mm with contractions ,       Manual Therapy   Manual therapy comments  fascial release ver R LQ scar.  Withheld manual Tx over L LQ scar 2/2 to recent procedure. L scar is well healed and less swelling noted compared to last session                   PT Long Term Goals - 08/02/18 1131      PT LONG TERM GOAL #1   Title  Pt will decrease padded pants from 3 x day to 1x day in order to promote hygiene and QOL     Time  10    Period  Weeks    Status  New    Target Date  10/11/18      PT LONG TERM GOAL #2   Title  Pt will demo decrease abdominal scar restrictions over L lower quadrant in order to promote improved deep core coordination and strength for continence    Time  6    Period  Weeks    Status  New    Target Date  09/15/18      PT LONG TERM GOAL #3   Title  Pt wil demo proper body mechanics to minimize straining pelvic  floor and co-activation technique fwith exhalation twith sit to stand to minimize leakage    Time  2    Period  Weeks    Status  New    Target Date  08/18/18      PT LONG TERM GOAL #4   Title  Pt will demo proper pelvic floor strength 3/3/3/3 in order to decrease urinary pads    Time  8    Period  Weeks    Status  New    Target Date  09/29/18            Plan - 08/11/18 1725    Clinical Impression Statement  Pt required excessive tactile and visual cues before demonstrating proper coordination of deep core mm with less chest breathing, more diaphragmatic/ pelvic floor excursion, and stronger pelvic floor contraction without receruiting accessory mm. Anticipate pt will regain continence with pelvic floor strengthening. Pt required review of body mechanics again today to minimize abdominopelvic and spinal strain in sit to stand and getting up from supine to sitting on plinth. Pt continues to benefit from skilled PT.       Rehab Potential  Good    PT Frequency  1x / week    PT Duration  Other (comment)   10   PT Treatment/Interventions  Gait training;Moist Heat;Therapeutic activities;Therapeutic exercise;Manual techniques;Neuromuscular re-education;Scar mobilization;Taping;Stair training;Functional mobility training;Patient/family education    Consulted and Agree with Plan of Care  Patient       Patient will benefit from skilled therapeutic intervention in order to improve the following deficits and impairments:  Improper body mechanics, Decreased scar mobility, Decreased range of motion, Decreased strength, Increased fascial restricitons, Decreased balance, Decreased mobility, Decreased endurance, Increased muscle spasms, Hypomobility, Postural dysfunction, Pain, Decreased safety awareness, Abnormal gait  Visit Diagnosis: Other abnormalities of gait and mobility  Muscle weakness (generalized)  Abnormal posture     Problem List Patient Active Problem List   Diagnosis Date  Noted  . Incarcerated inguinal hernia 07/12/2018  . Right inguinal hernia 11/25/2016  . Empyema lung ([place])   . Pleural effusion on right   . Pleural effusion, bacterial   . Empyema ([place]) 06/17/2016  .  Impingement syndrome of shoulder region 02/25/2016  . Shoulder pain 02/25/2016  . Subacromial impingement 02/25/2016  . Mixed hyperlipidemia 04/01/2015  . Murmur, cardiac 04/01/2015    Jerl Mina ,PT, DPT, E-RYT  08/11/2018, 5:28 PM  [place] MAIN [facility] SERVICES [address] [place], [place], [address] Phone: [phone]   Fax:  [phone]  Name: DELRAY REZA MRN: [id number] Date of Birth: 03-25-1931

## 2018-08-12 NOTE — Addendum Note (Signed)
Addended by: Jerl Mina on: 08/12/2018 05:33 PM   Modules accepted: Orders

## 2018-08-17 ENCOUNTER — Ambulatory Visit: Payer: Medicare Other | Admitting: Physical Therapy

## 2018-08-17 DIAGNOSIS — R2689 Other abnormalities of gait and mobility: Secondary | ICD-10-CM

## 2018-08-17 DIAGNOSIS — R293 Abnormal posture: Secondary | ICD-10-CM

## 2018-08-17 DIAGNOSIS — M6281 Muscle weakness (generalized): Secondary | ICD-10-CM

## 2018-08-17 NOTE — Therapy (Signed)
[place] MAIN [facility] SERVICES [address] [place], [place], [address] Phone: [phone]   Fax:  [phone]  Physical Therapy Treatment  Patient Details  Name: Jeffery [name] MRN: [id number] Date of Birth: 1931-04-18 Referring Provider (PT): Boneta Lucks    Encounter Date: 08/17/2018  PT End of Session - 08/17/18 1332    Visit Number  3    Number of Visits  10    Date for PT Re-Evaluation  10/11/18    Authorization Type  [employer]     PT Start Time  1110    PT Stop Time  1145    PT Time Calculation (min)  35 min    Activity Tolerance  Patient tolerated treatment well;No increased pain    Behavior During Therapy  [facility] for tasks assessed/performed       Past Medical History:  Diagnosis Date  . Does use hearing aid   . Empyema lung ([place]) 06/03/2016   MODERATE WBC PRESENT, PREDOMINANTLY PMN, STREP INTERMEDIUS  . Hyperlipidemia   . Pneumonia Nov/Dec 2017    Past Surgical History:  Procedure Laterality Date  . APPENDECTOMY  1971  . COLONOSCOPY  2014  . EYE SURGERY     cataract surgery   . GREEN LIGHT LASER TURP (TRANSURETHRAL RESECTION OF PROSTATE N/A 06/20/2018   Procedure: GREEN LIGHT LASER TURP (TRANSURETHRAL RESECTION OF PROSTATE;  Surgeon: Royston Cowper, MD;  Location: [facility] ORS;  Service: Urology;  Laterality: N/A;  . HERNIA REPAIR Left 2008  . INGUINAL HERNIA REPAIR Right 12/01/2016   Right inguinal hernia repair with medium Ultra Pro mesh.  . INGUINAL HERNIA REPAIR Left 07/12/2018   Procedure: HERNIA REPAIR INGUINAL ADULT;  Surgeon: Jules Husbands, MD;  Location: [facility] ORS;  Service: General;  Laterality: Left;  . INSERTION OF MESH Right 12/01/2016   Procedure: INSERTION OF MESH;  Surgeon: Robert Bellow, MD;  Location: [facility] ORS;  Service: General;  Laterality: Right;  . TONSILLECTOMY  1944    There were no vitals filed for this visit.  Subjective Assessment - 08/17/18 1115    Subjective  Pt reports his leakage is getting  better.  " Everyday it is getting better".   Pt has found that rocking the pelvis has helped with his bowel movements.     Patient is accompained by:  Family member   Wife --- Provided assistance with repeating questions. Pt's wife reported he is showing signed of early dementia.    Pertinent History  Hx inguinal hernial repair B, appendix removed           [facility] PT Assessment - 08/17/18 1125      Observation/Other Assessments   Observations  minor cues fo upright posture                 Pelvic Floor Special Questions - 08/17/18 0001    External Perineal Exam  correct technique nwithout overuse of glut /adductor mm with hooklying. Seated kegels with cues for upright posture, more anterior tilt of pelvis          [facility] Adult PT Treatment/Exercise - 08/17/18 1335      Therapeutic Activites    Other Therapeutic Activities  discussed with pt and wife to buy urinary pads and to not use paper towels in diapers. Provided chart for check off each set of 5 pelvic floor exercises during the day to promote adherence 2/2 to memory deficits and to yield greater outcomes.  Neuro Re-ed    Neuro Re-ed Details   reviewed pelvic floor and log rolling technique.                   PT Long Term Goals - 08/17/18 1136      PT LONG TERM GOAL #1   Title  Pt will decrease padded pants from 3 x day to 1x day in order to promote hygiene and QOL     Time  10    Period  Weeks    Status  On-going      PT LONG TERM GOAL #2   Title  Pt will demo decrease abdominal scar restrictions over L lower quadrant in order to promote improved deep core coordination and strength for continence    Time  6    Period  Weeks    Status  Achieved      PT LONG TERM GOAL #3   Title  Pt wil demo proper body mechanics to minimize straining pelvic floor and co-activation technique fwith exhalation twith sit to stand to minimize leakage    Time  2    Period  Weeks    Status  Achieved      PT LONG  TERM GOAL #4   Title  Pt will demo proper pelvic floor strength 3/3/3/3 in order to decrease urinary pads    Time  8    Period  Weeks    Status  Partially Met      PT LONG TERM GOAL #5   Title  Pt will decrease nocturia form 2-3x  night to 1x night in order to improve QOL    Time  10    Period  Weeks    Status  New      Additional Long Term Goals   Additional Long Term Goals  Yes      PT LONG TERM GOAL #6   Title  Pt will decrease his IPSS score from 11 to < 6 and QOL score from 6 to < 3 in order to improve QOL and pelvic floor     Baseline  08/17/18:  6 pts, and QOL 1pt      Time  4    Period  Weeks    Status  Achieved            Plan - 08/17/18 1332    Clinical Impression Statement  Pt demo'd good carry over with quick pelvic floor contractions in hooklying and seated positions. Pt also demo'd good carry over with pelvic tilts. These improvements match pt's report of noticing improvements with leakage and easier bowel movements.  Pt required moderate cues for log rolling technique to minimize straining abdomen to preserve integrity of hernial repair.  Provided education to pt and wife to buy urinary pads and to not use paper towels in diapers. Provided chart for check off each set of 5 pelvic floor exercises during the day to promote adherence 2/2 to memory deficits and to yield greater outcomes.  Pt continues to benefit from skilled PT.    Rehab Potential  Good    PT Frequency  1x / week    PT Duration  Other (comment)   10   PT Treatment/Interventions  Gait training;Moist Heat;Therapeutic activities;Therapeutic exercise;Manual techniques;Neuromuscular re-education;Scar mobilization;Taping;Stair training;Functional mobility training;Patient/family education    Consulted and Agree with Plan of Care  Patient       Patient will benefit from skilled therapeutic intervention in order to improve the following deficits  and impairments:  Improper body mechanics, Decreased scar  mobility, Decreased range of motion, Decreased strength, Increased fascial restricitons, Decreased balance, Decreased mobility, Decreased endurance, Increased muscle spasms, Hypomobility, Postural dysfunction, Pain, Decreased safety awareness, Abnormal gait  Visit Diagnosis: Other abnormalities of gait and mobility  Muscle weakness (generalized)  Abnormal posture     Problem List Patient Active Problem List   Diagnosis Date Noted  . Incarcerated inguinal hernia 07/12/2018  . Right inguinal hernia 11/25/2016  . Empyema lung ([place])   . Pleural effusion on right   . Pleural effusion, bacterial   . Empyema ([place]) 06/17/2016  . Impingement syndrome of shoulder region 02/25/2016  . Shoulder pain 02/25/2016  . Subacromial impingement 02/25/2016  . Mixed hyperlipidemia 04/01/2015  . Murmur, cardiac 04/01/2015    Jerl Mina ,PT, DPT, E-RYT  08/17/2018, 1:37 PM  [place] MAIN [facility] SERVICES [address] [place], [place], [address] Phone: [phone]   Fax:  [phone]  Name: Jeffery [name] MRN: [id number] Date of Birth: [birth year]

## 2018-08-17 NOTE — Patient Instructions (Addendum)
pelvic floor exercises , do in seated position, can place hands on the chair to help with more upright posture     Inhale and then exhale. Then squeeze the muscle.  (Be sure to let belly sink in with exhales and not push outward)  Perform 10  repetitions, 6  Times/day  ( in bed in morning,  Breakfast, lunch, afternoon, dinner, in bed)    ___Keep up with these exercises   Get urinary pads instead of paper towels.    ___  Complete chart with check off to ensure you have completed 5 reps of pelvic floor contractions across 5 different times a day

## 2018-08-22 ENCOUNTER — Ambulatory Visit: Payer: Medicare Other | Admitting: Physical Therapy

## 2018-08-31 ENCOUNTER — Ambulatory Visit: Payer: Medicare Other | Admitting: Physical Therapy

## 2018-09-14 ENCOUNTER — Ambulatory Visit: Payer: Medicare Other | Attending: Urology | Admitting: Physical Therapy

## 2018-09-14 DIAGNOSIS — M6281 Muscle weakness (generalized): Secondary | ICD-10-CM | POA: Diagnosis present

## 2018-09-14 DIAGNOSIS — R293 Abnormal posture: Secondary | ICD-10-CM | POA: Insufficient documentation

## 2018-09-14 DIAGNOSIS — R2689 Other abnormalities of gait and mobility: Secondary | ICD-10-CM | POA: Diagnosis not present

## 2018-09-15 NOTE — Therapy (Addendum)
[place] MAIN [facility] SERVICES [address] [place], [place], [address] Phone: [phone]   Fax:  [phone]  Physical Therapy Treatment / Discharge Summary   Patient Details  Name: Jeffery [name] MRN: [id number] Date of Birth: June 14, 1931 Referring Provider (PT): Boneta Lucks    Encounter Date: 09/14/2018  PT End of Session - 09/14/18 1428    Visit Number  4    Number of Visits  10    Date for PT Re-Evaluation  10/11/18    Authorization Type  [facility]     PT Start Time  1408    PT Stop Time  1440    PT Time Calculation (min)  32 min    Activity Tolerance  Patient tolerated treatment well;No increased pain    Behavior During Therapy  WFL for tasks assessed/performed       Past Medical History:  Diagnosis Date  . Does use hearing aid   . Empyema lung ([place]) 06/03/2016   MODERATE WBC PRESENT, PREDOMINANTLY PMN, STREP INTERMEDIUS  . Hyperlipidemia   . Pneumonia Nov/Dec 2017    Past Surgical History:  Procedure Laterality Date  . APPENDECTOMY  1971  . COLONOSCOPY  2014  . EYE SURGERY     cataract surgery   . GREEN LIGHT LASER TURP (TRANSURETHRAL RESECTION OF PROSTATE N/A 06/20/2018   Procedure: GREEN LIGHT LASER TURP (TRANSURETHRAL RESECTION OF PROSTATE;  Surgeon: Royston Cowper, MD;  Location: [facility] ORS;  Service: Urology;  Laterality: N/A;  . HERNIA REPAIR Left 2008  . INGUINAL HERNIA REPAIR Right 12/01/2016   Right inguinal hernia repair with medium Ultra Pro mesh.  . INGUINAL HERNIA REPAIR Left 07/12/2018   Procedure: HERNIA REPAIR INGUINAL ADULT;  Surgeon: Jules Husbands, MD;  Location: [facility] ORS;  Service: General;  Laterality: Left;  . INSERTION OF MESH Right 12/01/2016   Procedure: INSERTION OF MESH;  Surgeon: Robert Bellow, MD;  Location: [facility] ORS;  Service: General;  Laterality: Right;  . TONSILLECTOMY  1944    There were no vitals filed for this visit.  Subjective Assessment - 09/14/18 1414    Subjective  Wife reports they  have bought Poise pads instead of paper towels.     Patient is accompained by:  Family member   wife   Pertinent History  Hx inguinal hernial repair B, appendix removed           [facility] PT Assessment - 09/14/18 1447      Palpation   Palpation comment  overuse of glut mm .        Ambulation/Gait   Gait Comments  slowed gait, short stride                    [facility] Adult PT Treatment/Exercise - 09/14/18 1446      Therapeutic Activites    Other Therapeutic Activities  discussed w/ wife and pt w/ benefit for more PT for strengthening/ gait training to minimize gait training       Neuro Re-ed    Neuro Re-ed Details   reviewed pelvic floor contraction. Cued for breathing prior to contraction. cued for less glut activation                   PT Long Term Goals - 09/14/18 1427      PT LONG TERM GOAL #1   Title  Pt will decrease padded pants from 3 x day to 1x day in order to promote hygiene and  QOL     Time  10    Period  Weeks    Status  Unable to assess      PT LONG TERM GOAL #2   Title  Pt will demo decrease abdominal scar restrictions over L lower quadrant in order to promote improved deep core coordination and strength for continence    Time  6    Period  Weeks    Status  Achieved      PT LONG TERM GOAL #3   Title  Pt wil demo proper body mechanics to minimize straining pelvic floor and co-activation technique fwith exhalation twith sit to stand to minimize leakage    Time  2    Period  Weeks    Status  Achieved      PT LONG TERM GOAL #4   Title  Pt will demo proper pelvic floor strength 3/3/3/3 in order to decrease urinary pads    Time  8    Period  Weeks    Status  Achieved      PT LONG TERM GOAL #5   Title  Pt will decrease nocturia form 2-3x  night to 1x night in order to improve QOL    Time  10    Period  Weeks    Status Not met     PT LONG TERM GOAL #6   Title  Pt will decrease his IPSS score from 11 to < 6 and QOL score from 6 to < 3  in order to improve QOL and pelvic floor     Baseline  08/17/18:  6 pts, and QOL 1pt      Time  4    Period  Weeks    Status  Achieved            Plan - 09/14/18 1429    Clinical Impression Statement Pt has achieved 4/6 goals with decreased IPSS score along with improved scar mobility over abdominal scars, improved intraabdominal pressure system (pelvic floor coordination with abdominal/ diaphragm muscles) with proper pelvic floor mm contractions. Pt demonstrates signs of early dementia and wife has been present at all sessions to aid him with understanding. Pt 's wife has been provided information about [facility] services. Pt and wife have been recommended to continue to quick contraction of pelvic floor after d/c. They voiced understanding. Pt has been referred to OPPT for further balance and strengthening as pt demo'd slow gait with increased fall risks. A referral has been faxed for his PCP to sign.   Pt is ready for d/c from pelvic PT  at this time.      Rehab Potential  Good    PT Frequency  1x / week    PT Duration  Other (comment)   10   PT Treatment/Interventions  Gait training;Moist Heat;Therapeutic activities;Therapeutic exercise;Manual techniques;Neuromuscular re-education;Scar mobilization;Taping;Stair training;Functional mobility training;Patient/family education    Consulted and Agree with Plan of Care  Patient       Patient will benefit from skilled therapeutic intervention in order to improve the following deficits and impairments:  Improper body mechanics, Decreased scar mobility, Decreased range of motion, Decreased strength, Increased fascial restricitons, Decreased balance, Decreased mobility, Decreased endurance, Increased muscle spasms, Hypomobility, Postural dysfunction, Pain, Decreased safety awareness, Abnormal gait  Visit Diagnosis: Other abnormalities of gait and mobility  Abnormal posture  Muscle weakness (generalized)     Problem List Patient Active  Problem List   Diagnosis Date Noted  . Incarcerated inguinal hernia 07/12/2018  .  Right inguinal hernia 11/25/2016  . Empyema lung ([place])   . Pleural effusion on right   . Pleural effusion, bacterial   . Empyema ([place]) 06/17/2016  . Impingement syndrome of shoulder region 02/25/2016  . Shoulder pain 02/25/2016  . Subacromial impingement 02/25/2016  . Mixed hyperlipidemia 04/01/2015  . Murmur, cardiac 04/01/2015    Jerl Mina ,PT, DPT, E-RYT  09/15/2018, 12:44 PM  [place] MAIN [facility] SERVICES [address] [place], [place], [address] Phone: [phone]   Fax:  [phone]  Name: Jeffery [name] MRN: [id number] Date of Birth: 03-Jul-1931

## 2018-09-21 ENCOUNTER — Ambulatory Visit: Payer: Medicare Other | Admitting: Physical Therapy

## 2018-09-28 ENCOUNTER — Encounter: Payer: Medicare Other | Admitting: Physical Therapy

## 2018-10-11 ENCOUNTER — Telehealth: Payer: Self-pay | Admitting: *Deleted

## 2018-10-11 NOTE — Telephone Encounter (Signed)
Received request for refill on Ibuprofen 800 mg from [facility] is this still needed?

## 2018-10-24 ENCOUNTER — Ambulatory Visit: Payer: Medicare Other

## 2018-10-24 NOTE — Telephone Encounter (Signed)
No response from pt.

## 2018-10-26 ENCOUNTER — Ambulatory Visit: Payer: Medicare Other

## 2018-10-31 ENCOUNTER — Ambulatory Visit: Payer: Medicare Other

## 2018-11-02 ENCOUNTER — Ambulatory Visit: Payer: Medicare Other

## 2018-11-07 ENCOUNTER — Ambulatory Visit: Payer: Medicare Other

## 2018-11-09 ENCOUNTER — Ambulatory Visit: Payer: Medicare Other

## 2018-11-14 ENCOUNTER — Ambulatory Visit: Payer: Medicare Other

## 2018-11-16 ENCOUNTER — Ambulatory Visit: Payer: Medicare Other

## 2018-11-21 ENCOUNTER — Ambulatory Visit: Payer: Medicare Other

## 2018-11-23 ENCOUNTER — Ambulatory Visit: Payer: Medicare Other

## 2018-11-28 ENCOUNTER — Ambulatory Visit: Payer: Medicare Other

## 2018-11-30 ENCOUNTER — Ambulatory Visit: Payer: Medicare Other

## 2018-12-05 ENCOUNTER — Ambulatory Visit: Payer: Medicare Other

## 2018-12-07 ENCOUNTER — Ambulatory Visit: Payer: Medicare Other

## 2018-12-07 ENCOUNTER — Other Ambulatory Visit: Payer: Self-pay | Admitting: Neurology

## 2018-12-07 DIAGNOSIS — R413 Other amnesia: Secondary | ICD-10-CM

## 2018-12-12 ENCOUNTER — Ambulatory Visit: Payer: Medicare Other

## 2018-12-14 ENCOUNTER — Ambulatory Visit: Payer: Medicare Other

## 2018-12-22 ENCOUNTER — Other Ambulatory Visit: Payer: Self-pay

## 2018-12-22 ENCOUNTER — Ambulatory Visit
Admission: RE | Admit: 2018-12-22 | Discharge: 2018-12-22 | Disposition: A | Discharge status: Home / Self Care | Payer: Medicare Other | Source: Ambulatory Visit | Attending: Neurology | Admitting: Neurology

## 2018-12-22 DIAGNOSIS — R413 Other amnesia: Secondary | ICD-10-CM | POA: Insufficient documentation

## 2019-01-17 ENCOUNTER — Other Ambulatory Visit: Payer: Self-pay

## 2019-01-17 ENCOUNTER — Encounter: Payer: Self-pay | Admitting: Emergency Medicine

## 2019-01-17 ENCOUNTER — Inpatient Hospital Stay
Admission: EM | Admit: 2019-01-17 | Discharge: 2019-01-20 | DRG: 641 | Disposition: A | Discharge status: Home / Self Care | Payer: Medicare Other | Attending: Internal Medicine | Admitting: Internal Medicine

## 2019-01-17 ENCOUNTER — Emergency Department: Payer: Medicare Other

## 2019-01-17 DIAGNOSIS — I480 Paroxysmal atrial fibrillation: Secondary | ICD-10-CM

## 2019-01-17 DIAGNOSIS — Z7901 Long term (current) use of anticoagulants: Secondary | ICD-10-CM

## 2019-01-17 DIAGNOSIS — E861 Hypovolemia: Secondary | ICD-10-CM | POA: Diagnosis present

## 2019-01-17 DIAGNOSIS — E871 Hypo-osmolality and hyponatremia: Secondary | ICD-10-CM | POA: Diagnosis present

## 2019-01-17 DIAGNOSIS — E785 Hyperlipidemia, unspecified: Secondary | ICD-10-CM | POA: Diagnosis present

## 2019-01-17 DIAGNOSIS — I350 Nonrheumatic aortic (valve) stenosis: Secondary | ICD-10-CM | POA: Diagnosis present

## 2019-01-17 DIAGNOSIS — Z8249 Family history of ischemic heart disease and other diseases of the circulatory system: Secondary | ICD-10-CM

## 2019-01-17 DIAGNOSIS — R4182 Altered mental status, unspecified: Secondary | ICD-10-CM | POA: Diagnosis present

## 2019-01-17 DIAGNOSIS — Z87891 Personal history of nicotine dependence: Secondary | ICD-10-CM

## 2019-01-17 DIAGNOSIS — E876 Hypokalemia: Principal | ICD-10-CM | POA: Diagnosis present

## 2019-01-17 DIAGNOSIS — N390 Urinary tract infection, site not specified: Secondary | ICD-10-CM | POA: Diagnosis present

## 2019-01-17 DIAGNOSIS — F0390 Unspecified dementia without behavioral disturbance: Secondary | ICD-10-CM | POA: Diagnosis present

## 2019-01-17 DIAGNOSIS — Z79899 Other long term (current) drug therapy: Secondary | ICD-10-CM

## 2019-01-17 DIAGNOSIS — Z791 Long term (current) use of non-steroidal anti-inflammatories (NSAID): Secondary | ICD-10-CM

## 2019-01-17 DIAGNOSIS — Z1159 Encounter for screening for other viral diseases: Secondary | ICD-10-CM

## 2019-01-17 DIAGNOSIS — E875 Hyperkalemia: Secondary | ICD-10-CM | POA: Diagnosis present

## 2019-01-17 LAB — MAGNESIUM: Magnesium: 2 mg/dL (ref 1.7–2.4)

## 2019-01-17 LAB — COMPREHENSIVE METABOLIC PANEL
ALT: 18 U/L (ref 0–44)
AST: 16 U/L (ref 15–41)
Albumin: 3.6 g/dL (ref 3.5–5.0)
Alkaline Phosphatase: 65 U/L (ref 38–126)
Anion gap: 11 (ref 5–15)
BUN: 28 mg/dL — ABNORMAL HIGH (ref 8–23)
CO2: 24 mmol/L (ref 22–32)
Calcium: 8.4 mg/dL — ABNORMAL LOW (ref 8.9–10.3)
Chloride: 94 mmol/L — ABNORMAL LOW (ref 98–111)
Creatinine, Ser: 1.01 mg/dL (ref 0.61–1.24)
GFR calc Af Amer: >60 mL/min (ref >60)
GFR calc non Af Amer: >60 mL/min (ref >60)
Glucose, Bld: 111 mg/dL — ABNORMAL HIGH (ref 70–99)
Potassium: 3.2 mmol/L — ABNORMAL LOW (ref 3.5–5.1)
Sodium: 129 mmol/L — ABNORMAL LOW (ref 135–145)
Total Bilirubin: 0.8 mg/dL (ref 0.3–1.2)
Total Protein: 6.2 g/dL — ABNORMAL LOW (ref 6.5–8.1)

## 2019-01-17 LAB — CBC
HCT: 37.7 % — ABNORMAL LOW (ref 39.0–52.0)
Hemoglobin: 12.8 g/dL — ABNORMAL LOW (ref 13.0–17.0)
MCH: 29.7 pg (ref 26.0–34.0)
MCHC: 34 g/dL (ref 30.0–36.0)
MCV: 87.5 fL (ref 80.0–100.0)
Platelets: 162 10*3/uL (ref 150–400)
RBC: 4.31 MIL/uL (ref 4.22–5.81)
RDW: 13.3 % (ref 11.5–15.5)
WBC: 10.5 10*3/uL (ref 4.0–10.5)
nRBC: 0 % (ref 0.0–0.2)

## 2019-01-17 LAB — URINALYSIS, COMPLETE (UACMP) WITH MICROSCOPIC
Bacteria, UA: NONE SEEN
Bilirubin Urine: NEGATIVE
Glucose, UA: NEGATIVE mg/dL
Hgb urine dipstick: NEGATIVE
Ketones, ur: NEGATIVE mg/dL
Nitrite: NEGATIVE
Protein, ur: NEGATIVE mg/dL
Specific Gravity, Urine: 1.009 (ref 1.005–1.030)
pH: 6 (ref 5.0–8.0)

## 2019-01-17 LAB — BRAIN NATRIURETIC PEPTIDE: B Natriuretic Peptide: 741 pg/mL — ABNORMAL HIGH (ref 0.0–100.0)

## 2019-01-17 LAB — TROPONIN I (HIGH SENSITIVITY): Troponin I (High Sensitivity): 84 ng/L — ABNORMAL HIGH (ref <18)

## 2019-01-17 MED ORDER — POTASSIUM CHLORIDE CRYS ER 20 MEQ PO TBCR
20.0000 meq | EXTENDED_RELEASE_TABLET | Freq: Every day | ORAL | Status: DC
  Administered 2019-01-18 – 2019-01-20 (×3): 20 meq via ORAL
  Filled 2019-01-17 (×3): qty 1

## 2019-01-17 MED ORDER — METOPROLOL TARTRATE 25 MG PO TABS
12.5000 mg | ORAL_TABLET | Freq: Two times a day (BID) | ORAL | Status: DC
  Administered 2019-01-17 – 2019-01-19 (×3): 12.5 mg via ORAL
  Filled 2019-01-17 (×4): qty 1

## 2019-01-17 MED ORDER — ACETAMINOPHEN 325 MG PO TABS
650.0000 mg | ORAL_TABLET | Freq: Four times a day (QID) | ORAL | Status: DC | PRN
  Administered 2019-01-19 – 2019-01-20 (×2): 650 mg via ORAL
  Filled 2019-01-17 (×2): qty 2

## 2019-01-17 MED ORDER — ONDANSETRON HCL 4 MG PO TABS: 4.0000 mg | ORAL_TABLET | Freq: Four times a day (QID) | ORAL | Status: DC | PRN

## 2019-01-17 MED ORDER — SODIUM CHLORIDE 0.9 % IV BOLUS: 1000.0000 mL | Freq: Once | INTRAVENOUS | Status: DC

## 2019-01-17 MED ORDER — POTASSIUM CHLORIDE IN NACL 20-0.9 MEQ/L-% IV SOLN
INTRAVENOUS | Status: DC
  Administered 2019-01-17 – 2019-01-20 (×5): via INTRAVENOUS
  Filled 2019-01-17 (×7): qty 1000

## 2019-01-17 MED ORDER — ACETAMINOPHEN 650 MG RE SUPP: 650.0000 mg | Freq: Four times a day (QID) | RECTAL | Status: DC | PRN

## 2019-01-17 MED ORDER — SODIUM CHLORIDE 0.9 % IV BOLUS
250.0000 mL | Freq: Once | INTRAVENOUS | Status: AC
  Administered 2019-01-17: 250 mL via INTRAVENOUS

## 2019-01-17 MED ORDER — POTASSIUM CHLORIDE 10 MEQ/100ML IV SOLN
10.0000 meq | Freq: Once | INTRAVENOUS | Status: AC
  Administered 2019-01-17: 10 meq via INTRAVENOUS
  Filled 2019-01-17: qty 100

## 2019-01-17 MED ORDER — METOPROLOL TARTRATE 25 MG PO TABS
25.0000 mg | ORAL_TABLET | Freq: Once | ORAL | Status: AC
  Administered 2019-01-17: 25 mg via ORAL
  Filled 2019-01-17: qty 1

## 2019-01-17 MED ORDER — ENOXAPARIN SODIUM 40 MG/0.4ML ~~LOC~~ SOLN
40.0000 mg | SUBCUTANEOUS | Status: DC
  Administered 2019-01-17: 40 mg via SUBCUTANEOUS
  Filled 2019-01-17: qty 0.4

## 2019-01-17 MED ORDER — ONDANSETRON HCL 4 MG/2ML IJ SOLN: 4.0000 mg | Freq: Four times a day (QID) | INTRAMUSCULAR | Status: DC | PRN

## 2019-01-17 NOTE — ED Notes (Signed)
ED TO INPATIENT HANDOFF REPORT  ED Nurse Name and Phone #:    S Name/Age/Gender Jeffery [name] 85 y.o. male Room/Bed: ED09A/ED09A  Code Status   Code Status: Prior  Home/SNF/Other Home Patient oriented to: self and place Is this baseline? Yes   Triage Complete: Triage complete  Chief Complaint poss uti confused  Triage Note Pt in via POV, wife expresses concern for recurrent UTI due to worsening confusion and generalized weakness over the last few days.  Pt finished antibiotic treatment for UTI 2 days ago.  Wife also reports episode of chills and vomiting over the weekend.  Wife is unsure if confusion is related to Aricept dosing or ongoing UTI.  She has reached out do neurology who has advised to half the Aricept dose but has seen no change in patient symptoms.  Pt A/Ox3 upon arrival, denies any pain at this time.   Allergies No Known Allergies  Level of Care/Admitting Diagnosis ED Disposition    ED Disposition Condition [facility] Area: [place] [[id number]]  Level of Care: Med-Surg [16]  Covid Evaluation: Person Under Investigation (PUI)  Diagnosis: Altered mental status [780.97.ICD-9-CM]  Admitting Physician: Henreitta Leber [[id number]]  Attending Physician: Henreitta Leber [[id number]]  PT Class (Do Not Modify): Observation [104]  PT Acc Code (Do Not Modify): Observation [10022]       B Medical/Surgery History Past Medical History:  Diagnosis Date  . Does use hearing aid   . Empyema lung ([place]) 06/03/2016   MODERATE WBC PRESENT, PREDOMINANTLY PMN, STREP INTERMEDIUS  . Hyperlipidemia   . Pneumonia Nov/Dec 2017   Past Surgical History:  Procedure Laterality Date  . APPENDECTOMY  1971  . COLONOSCOPY  2014  . EYE SURGERY     cataract surgery   . GREEN LIGHT LASER TURP (TRANSURETHRAL RESECTION OF PROSTATE N/A 06/20/2018   Procedure: GREEN LIGHT LASER TURP (TRANSURETHRAL RESECTION OF PROSTATE;  Surgeon: Royston Cowper,  MD;  Location: [facility] ORS;  Service: Urology;  Laterality: N/A;  . HERNIA REPAIR Left 2008  . INGUINAL HERNIA REPAIR Right 12/01/2016   Right inguinal hernia repair with medium Ultra Pro mesh.  . INGUINAL HERNIA REPAIR Left 07/12/2018   Procedure: HERNIA REPAIR INGUINAL ADULT;  Surgeon: Jules Husbands, MD;  Location: [facility] ORS;  Service: General;  Laterality: Left;  . INSERTION OF MESH Right 12/01/2016   Procedure: INSERTION OF MESH;  Surgeon: Robert Bellow, MD;  Location: [facility] ORS;  Service: General;  Laterality: Right;  . TONSILLECTOMY  1944     A IV Location/Drains/Wounds Patient Lines/Drains/Airways Status   Active Line/Drains/Airways    Name:   Placement date:   Placement time:   Site:   Days:   Peripheral IV 07/13/18 Right;Lateral Wrist   07/13/18    0542    Wrist   188   Peripheral IV 01/17/19 Left Antecubital   01/17/19    1751    Antecubital   less than 1   Incision (Closed) 06/18/16 Chest Right   06/18/16    1200     943   Incision (Closed) 12/01/16 Abdomen Other (Comment)   12/01/16    1357     777   Incision (Closed) 06/20/18 Penis Other (Comment)   06/20/18    1355     211   Incision (Closed) 07/12/18 Abdomen Left   07/12/18    2214     189  Intake/Output Last 24 hours  Intake/Output Summary (Last 24 hours) at 01/17/2019 2042 Last data filed at 01/17/2019 2001 Gross per 24 hour  Intake 350 ml  Output -  Net 350 ml    Labs/Imaging Results for orders placed or performed during the hospital encounter of 01/17/19 (from the past 48 hour(s))  Comprehensive metabolic panel     Status: Abnormal   Collection Time: 01/17/19  3:19 PM  Result Value Ref Range   Sodium 129 (L) 135 - 145 mmol/L   Potassium 3.2 (L) 3.5 - 5.1 mmol/L   Chloride 94 (L) 98 - 111 mmol/L   CO2 24 22 - 32 mmol/L   Glucose, Bld 111 (H) 70 - 99 mg/dL   BUN 28 (H) 8 - 23 mg/dL   Creatinine, Ser 1.01 0.61 - 1.24 mg/dL   Calcium 8.4 (L) 8.9 - 10.3 mg/dL   Total Protein 6.2 (L) 6.5 - 8.1 g/dL    Albumin 3.6 3.5 - 5.0 g/dL   AST 16 15 - 41 U/L   ALT 18 0 - 44 U/L   Alkaline Phosphatase 65 38 - 126 U/L   Total Bilirubin 0.8 0.3 - 1.2 mg/dL   GFR calc non Af Amer >60 >60 mL/min   GFR calc Af Amer >60 >60 mL/min   Anion gap 11 5 - 15    Comment: Performed at [facility], [address] [place], [place] [address]  CBC     Status: Abnormal   Collection Time: 01/17/19  3:19 PM  Result Value Ref Range   WBC 10.5 4.0 - 10.5 K/uL   RBC 4.31 4.22 - 5.81 MIL/uL   Hemoglobin 12.8 (L) 13.0 - 17.0 g/dL   HCT 37.7 (L) 39.0 - 52.0 %   MCV 87.5 80.0 - 100.0 fL   MCH 29.7 26.0 - 34.0 pg   MCHC 34.0 30.0 - 36.0 g/dL   RDW 13.3 11.5 - 15.5 %   Platelets 162 150 - 400 K/uL   nRBC 0.0 0.0 - 0.2 %    Comment: Performed at [facility], [address] [place],  [address]  Urinalysis, Complete w Microscopic     Status: Abnormal   Collection Time: 01/17/19  3:19 PM  Result Value Ref Range   Color, Urine YELLOW (A) YELLOW   APPearance CLEAR (A) CLEAR   Specific Gravity, Urine 1.009 1.005 - 1.030   pH 6.0 5.0 - 8.0   Glucose, UA NEGATIVE NEGATIVE mg/dL   Hgb urine dipstick NEGATIVE NEGATIVE   Bilirubin Urine NEGATIVE NEGATIVE   Ketones, ur NEGATIVE NEGATIVE mg/dL   Protein, ur NEGATIVE NEGATIVE mg/dL   Nitrite NEGATIVE NEGATIVE   Leukocytes,Ua TRACE (A) NEGATIVE   RBC / HPF 0-5 0 - 5 RBC/hpf   WBC, UA 0-5 0 - 5 WBC/hpf   Bacteria, UA NONE SEEN NONE SEEN   Squamous Epithelial / LPF 0-5 0 - 5   Mucus PRESENT     Comment: Performed at [facility], [place], [place] [address]  Troponin I (High Sensitivity)     Status: Abnormal   Collection Time: 01/17/19  5:44 PM  Result Value Ref Range   Troponin I (High Sensitivity) 84 (H) <18 ng/L    Comment: (NOTE) Elevated high sensitivity troponin I (hsTnI) values and significant  changes across serial measurements may suggest ACS but many other  chronic and acute conditions are known to elevate  hsTnI results.  Refer to the "Links" section for chest  pain algorithms and additional  guidance. Performed at [facility], [address] [place], [place] [address]   Brain natriuretic peptide     Status: Abnormal   Collection Time: 01/17/19  5:44 PM  Result Value Ref Range   B Natriuretic Peptide 741.0 (H) 0.0 - 100.0 pg/mL    Comment: Performed at [facility], [address] [place], [place] [address]  Magnesium     Status: None   Collection Time: 01/17/19  5:44 PM  Result Value Ref Range   Magnesium 2.0 1.7 - 2.4 mg/dL    Comment: Performed at [facility], [address], [place], [place] [address]   Dg Chest 2 View  Result Date: 01/17/2019 CLINICAL DATA:  Weakness EXAM: CHEST - 2 VIEW COMPARISON:  07/12/2018 FINDINGS: Cardiac shadows within normal limits. The lungs are well aerated bilaterally. Minimal scarring is noted in the right base stable from previous exam. No new focal infiltrate is seen. No bony abnormality is noted. IMPRESSION: Mild right basilar scarring.  No acute abnormality noted. Electronically Signed   By: Inez Catalina M.D.   On: 01/17/2019 18:18   Ct Head Wo Contrast  Result Date: 01/17/2019 CLINICAL DATA:  Altered mental status. EXAM: CT HEAD WITHOUT CONTRAST TECHNIQUE: Contiguous axial images were obtained from the base of the skull through the vertex without intravenous contrast. COMPARISON:  MRI dated December 22, 2018. FINDINGS: Brain: No evidence of acute infarction, hemorrhage, hydrocephalus, extra-axial collection or mass lesion/mass effect. Again noted is atrophy and chronic microvascular ischemic changes bilaterally. Vascular: No hyperdense vessel or unexpected calcification. Skull: Normal. Negative for fracture or focal lesion. Sinuses/Orbits: No acute finding. Other: None. IMPRESSION: No acute intracranial abnormality detected. Electronically Signed   By: Constance Holster M.D.   On: 01/17/2019 18:09    Pending Labs Unresulted  Labs (From admission, onward)    Start     Ordered   01/17/19 1844  Novel Coronavirus,NAA,(SEND-OUT TO REF LAB - TAT 24-48 hrs); Hosp Order  (Asymptomatic Patients Labs)  ONCE - STAT,   STAT    Question:  Rule Out  Answer:  Yes   01/17/19 1843   01/17/19 1508  Urine culture  ONCE - STAT,   STAT     01/17/19 1507   Signed and Held  Basic metabolic panel  Tomorrow morning,   R     Signed and Held   Signed and Held  CBC  Tomorrow morning,   R     Signed and Held   Signed and Held  CBC  (enoxaparin (LOVENOX)    CrCl >/= 30 ml/min)  Once,   R    Comments: Baseline for enoxaparin therapy IF NOT ALREADY DRAWN.  Notify MD if PLT < 100 K.    Signed and Held   Signed and Held  Creatinine, serum  (enoxaparin (LOVENOX)    CrCl >/= 30 ml/min)  Once,   R    Comments: Baseline for enoxaparin therapy IF NOT ALREADY DRAWN.    Signed and Held   Signed and Held  Creatinine, serum  (enoxaparin (LOVENOX)    CrCl >/= 30 ml/min)  Weekly,   R    Comments: while on enoxaparin therapy    Signed and Held          Vitals/Pain Today's Vitals   01/17/19 1504 01/17/19 1733 01/17/19 1850 01/17/19 2005  BP:  110/76 106/72   Pulse:  77 (!) 103   Resp:  18 17   Temp:  TempSrc:      SpO2:  99% 100%   Weight: 68 kg     Height: 5\' 11"  (1.803 m)     PainSc: 0-No pain 0-No pain 0-No pain 0-No pain    Isolation Precautions No active isolations  Medications Medications  potassium chloride SA (K-DUR) CR tablet 20 mEq (has no administration in time range)  metoprolol tartrate (LOPRESSOR) tablet 25 mg (25 mg Oral Given 01/17/19 1844)  potassium chloride 10 mEq in 100 mL IVPB (0 mEq Intravenous Stopped 01/17/19 2001)  sodium chloride 0.9 % bolus 250 mL (0 mLs Intravenous Stopped 01/17/19 1852)    Mobility walks with device Low fall risk   Focused Assessments Neuro Assessment Handoff:  Swallow screen pass?           Neuro Assessment: Within Defined Limits Neuro Checks:      Last Documented NIHSS  Modified Score:   Has TPA been given? No If patient is a Neuro Trauma and patient is going to OR before floor call report to [place] nurse: [phone] or [phone]  ,    R Recommendations: See Admitting Provider Note  Report given to:   Additional Notes:

## 2019-01-17 NOTE — H&P (Signed)
[facility] NAME: Jeffery [name]    MR#:  [id number]  DATE OF BIRTH:  06-29-1931  DATE OF ADMISSION:  01/17/2019  PRIMARY CARE PHYSICIAN: Albina Billet, MD   REQUESTING/REFERRING PHYSICIAN: Dr. Delman Kitten  CHIEF COMPLAINT:   Chief Complaint  Patient presents with  . Altered Mental Status  . Recurrent UTI    HISTORY OF PRESENT ILLNESS:  Jeffery [name]  is a 85 y.o. male with a known history of dementia, hyperlipidemia, previous history of empyema who presents to the hospital due to altered mental status and suspected UTI.  Patient himself is a poor historian therefore most of the history obtained from the chart and from the ER physician.  I attempted to call the patient's wife multiple times but there was no answer.  Patient apparently has been more more confused over the past few days to week and his wife thought that he had a recurrent UTI, although when patient presented to the ER his urinalysis was negative.  Incidentally patient was noted to be mildly hyponatremic hypokalemic and also noted to be new onset atrial fibrillation.  Hospitalist services were contacted for admission.  Patient has had no fever, chills, cough, shortness of breath, nausea, vomiting diarrhea or any other associated symptoms as per the chart reviewed.  PAST MEDICAL HISTORY:   Past Medical History:  Diagnosis Date  . Does use hearing aid   . Empyema lung ([place]) 06/03/2016   MODERATE WBC PRESENT, PREDOMINANTLY PMN, STREP INTERMEDIUS  . Hyperlipidemia   . Pneumonia Nov/Dec 2017    PAST SURGICAL HISTORY:   Past Surgical History:  Procedure Laterality Date  . APPENDECTOMY  1971  . COLONOSCOPY  2014  . EYE SURGERY     cataract surgery   . GREEN LIGHT LASER TURP (TRANSURETHRAL RESECTION OF PROSTATE N/A 06/20/2018   Procedure: GREEN LIGHT LASER TURP (TRANSURETHRAL RESECTION OF PROSTATE;  Surgeon: Royston Cowper, MD;  Location: [facility] ORS;  Service: Urology;   Laterality: N/A;  . HERNIA REPAIR Left 2008  . INGUINAL HERNIA REPAIR Right 12/01/2016   Right inguinal hernia repair with medium Ultra Pro mesh.  . INGUINAL HERNIA REPAIR Left 07/12/2018   Procedure: HERNIA REPAIR INGUINAL ADULT;  Surgeon: Jules Husbands, MD;  Location: [facility] ORS;  Service: General;  Laterality: Left;  . INSERTION OF MESH Right 12/01/2016   Procedure: INSERTION OF MESH;  Surgeon: Robert Bellow, MD;  Location: [facility] ORS;  Service: General;  Laterality: Right;  . TONSILLECTOMY  1944    SOCIAL HISTORY:   Social History   Tobacco Use  . Smoking status: Former Smoker    Packs/day: 1.00    Years: 10.00    Pack years: 10.00    Types: Cigarettes  . Smokeless tobacco: Never Used  . Tobacco comment: quit smoking in 1970  Substance Use Topics  . Alcohol use: Yes    Comment: occ 3/week    FAMILY HISTORY:   Family History  Problem Relation Age of Onset  . Heart attack Father   . ALS Sister   . Cancer Sister   . ALS Sister     DRUG ALLERGIES:  No Known Allergies  REVIEW OF SYSTEMS:   Review of Systems  Unable to perform ROS: Mental acuity    MEDICATIONS AT HOME:   Prior to Admission medications   Medication Sig Start Date End Date Taking? Authorizing Provider  donepezil (ARICEPT) 23 MG TABS tablet Take 23 mg  by mouth daily. 01/01/19  Yes [provider]      VITAL SIGNS:  Blood pressure 106/72, pulse (!) 103, temperature 98.1 F (36.7 C), temperature source Oral, resp. rate 17, height 5\' 11"  (1.803 m), weight 68 kg, SpO2 100 %.  PHYSICAL EXAMINATION:  Physical Exam  GENERAL:  85 y.o.-year-old patient lying in the bed in no acute distress.  EYES: Pupils equal, round, reactive to light and accommodation. No scleral icterus. Extraocular muscles intact.  HEENT: Head atraumatic, normocephalic. Oropharynx and nasopharynx clear. No oropharyngeal erythema, moist oral mucosa  NECK:  Supple, no jugular venous distention. No thyroid enlargement, no  tenderness.  LUNGS: Normal breath sounds bilaterally, no wheezing, rales, rhonchi. No use of accessory muscles of respiration.  CARDIOVASCULAR: S1, S2 RRR. No murmurs, rubs, gallops, clicks.  ABDOMEN: Soft, nontender, nondistended. Bowel sounds present. No organomegaly or mass.  EXTREMITIES: No pedal edema, cyanosis, or clubbing. + 2 pedal & radial pulses b/l.   NEUROLOGIC: Cranial nerves II through XII are intact. No focal Motor or sensory deficits appreciated b/l. Globally weak PSYCHIATRIC: The patient is alert and oriented x 1.  SKIN: No obvious rash, lesion, or ulcer.   LABORATORY PANEL:   CBC Recent Labs  Lab 01/17/19 1519  WBC 10.5  HGB 12.8*  HCT 37.7*  PLT 162   ------------------------------------------------------------------------------------------------------------------  Chemistries  Recent Labs  Lab 01/17/19 1519 01/17/19 1744  NA 129*  --   K 3.2*  --   CL 94*  --   CO2 24  --   GLUCOSE 111*  --   BUN 28*  --   CREATININE 1.01  --   CALCIUM 8.4*  --   MG  --  2.0  AST 16  --   ALT 18  --   ALKPHOS 65  --   BILITOT 0.8  --    ------------------------------------------------------------------------------------------------------------------  Cardiac Enzymes No results for input(s): TROPONINI in the last 168 hours. ------------------------------------------------------------------------------------------------------------------  RADIOLOGY:  Dg Chest 2 View  Result Date: 01/17/2019 CLINICAL DATA:  Weakness EXAM: CHEST - 2 VIEW COMPARISON:  07/12/2018 FINDINGS: Cardiac shadows within normal limits. The lungs are well aerated bilaterally. Minimal scarring is noted in the right base stable from previous exam. No new focal infiltrate is seen. No bony abnormality is noted. IMPRESSION: Mild right basilar scarring.  No acute abnormality noted. Electronically Signed   By: Inez Catalina M.D.   On: 01/17/2019 18:18   Ct Head Wo Contrast  Result Date: 01/17/2019  CLINICAL DATA:  Altered mental status. EXAM: CT HEAD WITHOUT CONTRAST TECHNIQUE: Contiguous axial images were obtained from the base of the skull through the vertex without intravenous contrast. COMPARISON:  MRI dated December 22, 2018. FINDINGS: Brain: No evidence of acute infarction, hemorrhage, hydrocephalus, extra-axial collection or mass lesion/mass effect. Again noted is atrophy and chronic microvascular ischemic changes bilaterally. Vascular: No hyperdense vessel or unexpected calcification. Skull: Normal. Negative for fracture or focal lesion. Sinuses/Orbits: No acute finding. Other: None. IMPRESSION: No acute intracranial abnormality detected. Electronically Signed   By: Constance Holster M.D.   On: 01/17/2019 18:09     IMPRESSION AND PLAN:   85 y.o. male with a known history of dementia, hyperlipidemia, previous history of empyema who presents to the hospital due to altered mental status and suspected UTI.  1.  Altered mental status-suspected to be metabolic encephalopathy due to underlying dementia combined with electrolyte abnormalities of hyponatremia and hypokalemia.  Patient's urinalysis is negative for UTI. -We will hydrate the patient  with IV fluids, follow mental status.  CT head was negative for acute pathology.  2.  Hyponatremia- mild and likely hypovolemic hypo-tonic in nature. -We will gently hydrate the patient with IV fluids, follow sodium.  3.  Hypokalemia-will replace potassium orally and intravenously.  Repeat in the morning. -Magnesium level is normal.  4.  History of dementia-patient mental status has been slowly getting worse which could be worsening dementia.  Recently taken off his Aricept.  We will get physical therapy consult to assess mobility.  5.  New onset atrial fibrillation-this was instantly noted in the ER while patient was on the monitor.  Patient is clinically asymptomatic. - We will start on low-dose metoprolol, will get echocardiogram, cardiology  consulted with Dr. Saralyn Pilar.    All the records are reviewed and case discussed with ED provider. Management plans discussed with the patient, family and they are in agreement.  CODE STATUS: Full code  TOTAL TIME TAKING CARE OF THIS PATIENT: 40 minutes.    Henreitta Leber M.D on 01/17/2019 at 7:35 PM  Between 7am to 6pm - Pager - [phone]  After 6pm go to [email or web address] - password [email or web address] [facility]  [facility]  Office  [phone]  CC: Primary care physician; Albina Billet, MD

## 2019-01-17 NOTE — ED Notes (Signed)
Nurse unable to take report  

## 2019-01-17 NOTE — ED Notes (Signed)
As per wife patient increase in change of mental status. md at bedside to assess plan care.

## 2019-01-17 NOTE — ED Notes (Signed)
Returned from CT.

## 2019-01-17 NOTE — ED Notes (Signed)
Patient incontinent of bladder, peri care provided, patient changed into gown, yellow socks applied. Report called to unit.

## 2019-01-17 NOTE — ED Provider Notes (Signed)
[facility] Emergency Department Provider Note   ____________________________________________   First MD Initiated Contact with Patient 01/17/19 1648     (approximate)  I have reviewed the triage vital signs and the nursing notes.   HISTORY  Chief Complaint Altered Mental Status and Recurrent UTI   EM caveat: Patient with confusion, wife provides majority history  HPI Jeffery [name] is a 85 y.o. male   has been developing some dementia, but wife reports over the last 1 week he has been feeling more fatigued he was treated for urinary tract infection, but he continues to do seem more confused than normal  He had some nausea and vomiting a couple days ago that went away.  He has not been complain of any pain.  He has not had any ongoing fever.  Patient reports he just does not feel well, he cannot describe it comes and goes.  Past Medical History:  Diagnosis Date  . Does use hearing aid   . Empyema lung ([place]) 06/03/2016   MODERATE WBC PRESENT, PREDOMINANTLY PMN, STREP INTERMEDIUS  . Hyperlipidemia   . Pneumonia Nov/Dec 2017    Patient Active Problem List   Diagnosis Date Noted  . Incarcerated inguinal hernia 07/12/2018  . Right inguinal hernia 11/25/2016  . Empyema lung ([place])   . Pleural effusion on right   . Pleural effusion, bacterial   . Empyema ([place]) 06/17/2016  . Impingement syndrome of shoulder region 02/25/2016  . Shoulder pain 02/25/2016  . Subacromial impingement 02/25/2016  . Mixed hyperlipidemia 04/01/2015  . Murmur, cardiac 04/01/2015    Past Surgical History:  Procedure Laterality Date  . APPENDECTOMY  1971  . COLONOSCOPY  2014  . EYE SURGERY     cataract surgery   . GREEN LIGHT LASER TURP (TRANSURETHRAL RESECTION OF PROSTATE N/A 06/20/2018   Procedure: GREEN LIGHT LASER TURP (TRANSURETHRAL RESECTION OF PROSTATE;  Surgeon: Royston Cowper, MD;  Location: [facility] ORS;  Service: Urology;  Laterality: N/A;  . HERNIA REPAIR Left  2008  . INGUINAL HERNIA REPAIR Right 12/01/2016   Right inguinal hernia repair with medium Ultra Pro mesh.  . INGUINAL HERNIA REPAIR Left 07/12/2018   Procedure: HERNIA REPAIR INGUINAL ADULT;  Surgeon: Jules Husbands, MD;  Location: [facility] ORS;  Service: General;  Laterality: Left;  . INSERTION OF MESH Right 12/01/2016   Procedure: INSERTION OF MESH;  Surgeon: Robert Bellow, MD;  Location: [facility] ORS;  Service: General;  Laterality: Right;  . TONSILLECTOMY  1944    Prior to Admission medications   Medication Sig Start Date End Date Taking? Authorizing Provider  acetaminophen-codeine (TYLENOL #3) 300-30 MG tablet Take 1-2 tablets by mouth every 4 (four) hours as needed for moderate pain. 06/20/18   Royston Cowper, MD  B Complex-Biotin-FA (SUPER B-50 PO) Take 1 capsule by mouth every Monday, Wednesday, and Friday.     [provider]  brimonidine-timolol (COMBIGAN) 0.2-0.5 % ophthalmic solution Place 1 drop into both eyes every 12 (twelve) hours.    [provider]  docusate sodium (COLACE) 100 MG capsule Take 2 capsules (200 mg total) by mouth 2 (two) times daily. 06/20/18   Royston Cowper, MD  donepezil (ARICEPT) 10 MG tablet Take 10 mg by mouth at bedtime.    [provider]  Glucosamine-Chondroitin 500-250 MG CAPS Take 1 capsule by mouth every Monday, Wednesday, and Friday.     [provider]  ibuprofen (ADVIL,MOTRIN) 800 MG tablet Take 1 tablet (800 mg  total) by mouth every 8 (eight) hours as needed. 07/17/18   Tylene Fantasia, PA-C  Meth-Hyo-M Bl-Na Phos-Ph Sal (URIBEL) 118 MG CAPS Take 1 capsule (118 mg total) by mouth every 6 (six) hours as needed (dysuria). 06/20/18   Royston Cowper, MD  Multiple Vitamins-Minerals (SUPER ANTIOXIDANT PO) Take 1 tablet by mouth every Monday, Wednesday, and Friday.     [provider]  Omega-3 Fatty Acids (FISH OIL) 1000 MG CAPS Take 1,000 mg by mouth 4 (four) times a week.     [provider]   tamsulosin (FLOMAX) 0.4 MG CAPS capsule Take 0.4 mg by mouth daily after supper.    [provider]    Allergies Patient has no known allergies.  Family History  Problem Relation Age of Onset  . Heart attack Father   . ALS Sister   . Cancer Sister   . ALS Sister     Social History Social History   Tobacco Use  . Smoking status: Former Smoker    Packs/day: 1.00    Years: 10.00    Pack years: 10.00    Types: Cigarettes  . Smokeless tobacco: Never Used  . Tobacco comment: quit smoking in 1970  Substance Use Topics  . Alcohol use: Yes    Comment: occ 3/week  . Drug use: No    Review of Systems Constitutional: No fever/chills Eyes: No visual changes. Cardiovascular: Denies chest pain.  Wife reports has a known heart murmur Respiratory: Denies shortness of breath. Gastrointestinal: No abdominal pain.  Did throw up twice over the weekend Genitourinary: Negative for dysuria.  Treated recently for UTI however. Musculoskeletal: Negative for back pain. Skin: Negative for rash. Neurological: Negative for headaches, areas of focal weakness or numbness.    ____________________________________________   PHYSICAL EXAM:  VITAL SIGNS: ED Triage Vitals  Enc Vitals Group     BP 01/17/19 1503 114/77     Pulse Rate 01/17/19 1503 76     Resp 01/17/19 1503 16     Temp 01/17/19 1503 98.1 F (36.7 C)     Temp Source 01/17/19 1503 Oral     SpO2 01/17/19 1503 97 %     Weight 01/17/19 1504 150 lb (68 kg)     Height 01/17/19 1504 5\' 11"  (1.803 m)     Head Circumference --      Peak Flow --      Pain Score 01/17/19 1504 0     Pain Loc --      Pain Edu? --      Excl. in [place]? --     Constitutional: Alert and oriented to self and wife but not to situation. Well appearing and in no acute distress. Eyes: Conjunctivae are normal. Head: Atraumatic. Nose: No congestion/rhinnorhea. Mouth/Throat: Mucous membranes are moist. Neck: No stridor.  Cardiovascular: Tachycardic,  irregularly irregular.  Moderate systolic ejection murmur good peripheral circulation. Respiratory: Normal respiratory effort.  No retractions. Lungs CTAB. Gastrointestinal: Soft and nontender. No distention. Musculoskeletal: No lower extremity tenderness nor edema. Neurologic:  Normal speech and language. No gross focal neurologic deficits are appreciated.  Skin:  Skin is warm, dry and intact. No rash noted. Psychiatric: Mood and affect are somewhat flat but pleasant. Speech and behavior are normal.  ____________________________________________   LABS (all labs ordered are listed, but only abnormal results are displayed)  Labs Reviewed  COMPREHENSIVE METABOLIC PANEL - Abnormal; Notable for the following components:      Result Value   Sodium 129 (*)  Potassium 3.2 (*)    Chloride 94 (*)    Glucose, Bld 111 (*)    BUN 28 (*)    Calcium 8.4 (*)    Total Protein 6.2 (*)    All other components within normal limits  CBC - Abnormal; Notable for the following components:   Hemoglobin 12.8 (*)    HCT 37.7 (*)    All other components within normal limits  URINALYSIS, COMPLETE (UACMP) WITH MICROSCOPIC - Abnormal; Notable for the following components:   Color, Urine YELLOW (*)    APPearance CLEAR (*)    Leukocytes,Ua TRACE (*)    All other components within normal limits  BRAIN NATRIURETIC PEPTIDE - Abnormal; Notable for the following components:   B Natriuretic Peptide 741.0 (*)    All other components within normal limits  URINE CULTURE  NOVEL CORONAVIRUS, NAA (HOSPITAL ORDER, SEND-OUT TO REF LAB)  TROPONIN I (HIGH SENSITIVITY)  MAGNESIUM  CBG MONITORING, ED   ____________________________________________  EKG  Reviewed and interpreted by me at 1720 Heart rate 120 QRS 90 QTc 460 Atrial fibrillation, no evidence of acute ischemia ____________________________________________  RADIOLOGY  Dg Chest 2 View  Result Date: 01/17/2019 CLINICAL DATA:  Weakness EXAM: CHEST - 2  VIEW COMPARISON:  07/12/2018 FINDINGS: Cardiac shadows within normal limits. The lungs are well aerated bilaterally. Minimal scarring is noted in the right base stable from previous exam. No new focal infiltrate is seen. No bony abnormality is noted. IMPRESSION: Mild right basilar scarring.  No acute abnormality noted. Electronically Signed   By: Inez Catalina M.D.   On: 01/17/2019 18:18   Ct Head Wo Contrast  Result Date: 01/17/2019 CLINICAL DATA:  Altered mental status. EXAM: CT HEAD WITHOUT CONTRAST TECHNIQUE: Contiguous axial images were obtained from the base of the skull through the vertex without intravenous contrast. COMPARISON:  MRI dated December 22, 2018. FINDINGS: Brain: No evidence of acute infarction, hemorrhage, hydrocephalus, extra-axial collection or mass lesion/mass effect. Again noted is atrophy and chronic microvascular ischemic changes bilaterally. Vascular: No hyperdense vessel or unexpected calcification. Skull: Normal. Negative for fracture or focal lesion. Sinuses/Orbits: No acute finding. Other: None. IMPRESSION: No acute intracranial abnormality detected. Electronically Signed   By: Constance Holster M.D.   On: 01/17/2019 18:09    ____________________________________________   PROCEDURES  Procedure(s) performed: None  Procedures  Critical Care performed: No  ____________________________________________   INITIAL IMPRESSION / ASSESSMENT AND PLAN / ED COURSE  Pertinent labs & imaging results that were available during my care of the patient were reviewed by me and considered in my medical decision making (see chart for details).   Patient lab work returns shows hyponatremia, hypokalemia.  Also he is having paroxysmal episodes of A. fib in the ER which is new diagnosis.  Discussed with Dr. Saralyn Pilar, will admit, collect electrolytes, further work-up under the internal medicine service.  Family including wife agreeable with plan.  Patient does have dementia but it seems he  is had a rather rapid progression of his symptoms or the last several days.  Based on his worsening mental status new onset A. fib, CT the head was performed which is negative for acute stroke.  Discussed with hospitalist will admit for further evaluation  Jeffery [name] was evaluated in Emergency Department on 01/18/2019 for the symptoms described in the history of present illness. He was evaluated in the context of the global COVID-19 pandemic, which necessitated consideration that the patient might be at risk for infection with the SARS-CoV-2 virus  that causes COVID-19. Institutional protocols and algorithms that pertain to the evaluation of patients at risk for COVID-19 are in a state of rapid change based on information released by regulatory bodies including the CDC and federal and state organizations. These policies and algorithms were followed during the patient's care in the ED.  No high risk COVID factors, send out test ordered.      ____________________________________________   FINAL CLINICAL IMPRESSION(S) / ED DIAGNOSES  Final diagnoses:  Paroxysmal atrial fibrillation ([place])  Hyponatremia  Hypokalemia        Note:  This document was prepared using Dragon voice recognition software and may include unintentional dictation errors       Delman Kitten, MD 01/18/19 [phone]

## 2019-01-17 NOTE — ED Triage Notes (Signed)
Pt in via POV, wife expresses concern for recurrent UTI due to worsening confusion and generalized weakness over the last few days.  Pt finished antibiotic treatment for UTI 2 days ago.  Wife also reports episode of chills and vomiting over the weekend.  Wife is unsure if confusion is related to Aricept dosing or ongoing UTI.  She has reached out do neurology who has advised to half the Aricept dose but has seen no change in patient symptoms.  Pt A/Ox3 upon arrival, denies any pain at this time.

## 2019-01-17 NOTE — Progress Notes (Signed)
   Jeffery [name] at [facility] Day: 0 days Jeffery [name] is a 85 y.o. male has medical history of advanced dementia, previous history of prostate cancer presenting with Altered Mental Status and Recurrent UTI .   Spoke to the patient's wife about patient's CODE STATUS.  Explained to her the difference between full code and DNR.  Patient's wife does not want aggressive resuscitation and wants her husband to be given a fair chance but does not want to keep him alive on machines for prolonged period of time.  Patient will remain a full code for now.  Advance care planning discussed with patient  without additional Family at bedside. All questions in regards to overall condition and expected prognosis answered. The decision was made to continue current code status  CODE STATUS: full Time spent: 16 minutes

## 2019-01-18 ENCOUNTER — Observation Stay
Admit: 2019-01-18 | Discharge: 2019-01-18 | Disposition: A | Discharge status: Home / Self Care | Payer: Medicare Other | Attending: Specialist | Admitting: Specialist

## 2019-01-18 ENCOUNTER — Encounter: Payer: Self-pay | Admitting: *Deleted

## 2019-01-18 DIAGNOSIS — E876 Hypokalemia: Secondary | ICD-10-CM | POA: Diagnosis present

## 2019-01-18 DIAGNOSIS — E785 Hyperlipidemia, unspecified: Secondary | ICD-10-CM | POA: Diagnosis present

## 2019-01-18 DIAGNOSIS — Z1159 Encounter for screening for other viral diseases: Secondary | ICD-10-CM | POA: Diagnosis not present

## 2019-01-18 DIAGNOSIS — E871 Hypo-osmolality and hyponatremia: Secondary | ICD-10-CM | POA: Diagnosis present

## 2019-01-18 DIAGNOSIS — F0390 Unspecified dementia without behavioral disturbance: Secondary | ICD-10-CM | POA: Diagnosis present

## 2019-01-18 DIAGNOSIS — Z791 Long term (current) use of non-steroidal anti-inflammatories (NSAID): Secondary | ICD-10-CM | POA: Diagnosis not present

## 2019-01-18 DIAGNOSIS — Z87891 Personal history of nicotine dependence: Secondary | ICD-10-CM | POA: Diagnosis not present

## 2019-01-18 DIAGNOSIS — E861 Hypovolemia: Secondary | ICD-10-CM | POA: Diagnosis present

## 2019-01-18 DIAGNOSIS — N390 Urinary tract infection, site not specified: Secondary | ICD-10-CM | POA: Diagnosis present

## 2019-01-18 DIAGNOSIS — R4182 Altered mental status, unspecified: Secondary | ICD-10-CM | POA: Diagnosis present

## 2019-01-18 DIAGNOSIS — I480 Paroxysmal atrial fibrillation: Secondary | ICD-10-CM | POA: Diagnosis present

## 2019-01-18 DIAGNOSIS — Z7901 Long term (current) use of anticoagulants: Secondary | ICD-10-CM | POA: Diagnosis not present

## 2019-01-18 DIAGNOSIS — I350 Nonrheumatic aortic (valve) stenosis: Secondary | ICD-10-CM | POA: Diagnosis present

## 2019-01-18 DIAGNOSIS — Z8249 Family history of ischemic heart disease and other diseases of the circulatory system: Secondary | ICD-10-CM | POA: Diagnosis not present

## 2019-01-18 DIAGNOSIS — Z79899 Other long term (current) drug therapy: Secondary | ICD-10-CM | POA: Diagnosis not present

## 2019-01-18 DIAGNOSIS — E875 Hyperkalemia: Secondary | ICD-10-CM | POA: Diagnosis present

## 2019-01-18 LAB — CBC
HCT: 38 % — ABNORMAL LOW (ref 39.0–52.0)
Hemoglobin: 13 g/dL (ref 13.0–17.0)
MCH: 30 pg (ref 26.0–34.0)
MCHC: 34.2 g/dL (ref 30.0–36.0)
MCV: 87.6 fL (ref 80.0–100.0)
Platelets: 156 10*3/uL (ref 150–400)
RBC: 4.34 MIL/uL (ref 4.22–5.81)
RDW: 13.1 % (ref 11.5–15.5)
WBC: 7.2 10*3/uL (ref 4.0–10.5)
nRBC: 0 % (ref 0.0–0.2)

## 2019-01-18 LAB — URINE CULTURE: Culture: <10000 — AB

## 2019-01-18 LAB — BASIC METABOLIC PANEL
Anion gap: 8 (ref 5–15)
BUN: 16 mg/dL (ref 8–23)
CO2: 24 mmol/L (ref 22–32)
Calcium: 8.2 mg/dL — ABNORMAL LOW (ref 8.9–10.3)
Chloride: 106 mmol/L (ref 98–111)
Creatinine, Ser: 0.71 mg/dL (ref 0.61–1.24)
GFR calc Af Amer: >60 mL/min (ref >60)
GFR calc non Af Amer: >60 mL/min (ref >60)
Glucose, Bld: 98 mg/dL (ref 70–99)
Potassium: 3.6 mmol/L (ref 3.5–5.1)
Sodium: 138 mmol/L (ref 135–145)

## 2019-01-18 LAB — ECHOCARDIOGRAM COMPLETE
Height: 71 in
Weight: 2411.2 oz

## 2019-01-18 MED ORDER — APIXABAN 5 MG PO TABS
5.0000 mg | ORAL_TABLET | Freq: Two times a day (BID) | ORAL | Status: DC
  Administered 2019-01-18 – 2019-01-20 (×5): 5 mg via ORAL
  Filled 2019-01-18 (×5): qty 1

## 2019-01-18 MED ORDER — QUETIAPINE FUMARATE 25 MG PO TABS
25.0000 mg | ORAL_TABLET | Freq: Every evening | ORAL | Status: DC | PRN
  Administered 2019-01-18: 25 mg via ORAL
  Filled 2019-01-18: qty 1

## 2019-01-18 NOTE — Progress Notes (Signed)
Patient had a fall. Patient's bed alarm was alarming when I come in see the patient I heard him fell. The fall was unwitness but I did found the patient sitting in the floor. He denied hitting his head. Assessed patient for any injury, which he does not have, he denied pain, VSS. Notified Gardiner Barefoot, and attempted to call the wife Katharine Look and left a voice mail. Patient is confused, he was in low bed, yellow socks and fall bracelet in place. He just went to the bathroom 30 mins before the fall. RN gave Seroquel to help him sleep. Will place tele sitter on 1:1 sitter if available. RN will continue to monitor.

## 2019-01-18 NOTE — Progress Notes (Signed)
[facility] [place] NAME: Jeffery [name]    MR#:  [id number]  DATE OF BIRTH:  26-Dec-1930  SUBJECTIVE:  CHIEF COMPLAINT:   Chief Complaint  Patient presents with   Altered Mental Status   Recurrent UTI   No new complaint this morning.  Patient pleasantly confused. REVIEW OF SYSTEMS:  ROS Unobtainable due to underlying medical condition. DRUG ALLERGIES:  No Known Allergies VITALS:  Blood pressure 105/72, pulse (!) 103, temperature (!) 97.5 F (36.4 C), temperature source Oral, resp. rate 18, height 5\' 11"  (1.803 m), weight 68.4 kg, SpO2 99 %. PHYSICAL EXAMINATION:  Physical Exam  GENERAL:  85 y.o.-year-old patient lying in the bed in no acute distress.  EYES: Pupils equal, round, reactive to light and accommodation. No scleral icterus. Extraocular muscles intact.  HEENT: Head atraumatic, normocephalic. Oropharynx and nasopharynx clear. No oropharyngeal erythema, moist oral mucosa  NECK:  Supple, no jugular venous distention. No thyroid enlargement, no tenderness.  LUNGS: Normal breath sounds bilaterally, no wheezing, rales, rhonchi. No use of accessory muscles of respiration.  CARDIOVASCULAR: S1, S2 RRR. No murmurs, rubs, gallops, clicks.  ABDOMEN: Soft, nontender, nondistended. Bowel sounds present. No organomegaly or mass.  EXTREMITIES: No pedal edema, cyanosis, or clubbing. + 2 pedal & radial pulses b/l.   NEUROLOGIC: Moving all extremities with no focal deficit.  Generalized weakness.  PSYCHIATRIC: The patient is alert and oriented x 1.  Pleasantly confused SKIN: Noted diffuse spots /petechial rash which appears to be chronic.  Present on admission most likely   LABORATORY PANEL:  Male CBC Recent Labs  Lab 01/18/19 0617  WBC 7.2  HGB 13.0  HCT 38.0*  PLT 156   ------------------------------------------------------------------------------------------------------------------ Chemistries  Recent Labs  Lab 01/17/19 1519  01/17/19 1744 01/18/19 0617  NA 129*  --  138  K 3.2*  --  3.6  CL 94*  --  106  CO2 24  --  24  GLUCOSE 111*  --  98  BUN 28*  --  16  CREATININE 1.01  --  0.71  CALCIUM 8.4*  --  8.2*  MG  --  2.0  --   AST 16  --   --   ALT 18  --   --   ALKPHOS 65  --   --   BILITOT 0.8  --   --    RADIOLOGY:  Dg Chest 2 View  Result Date: 01/17/2019 CLINICAL DATA:  Weakness EXAM: CHEST - 2 VIEW COMPARISON:  07/12/2018 FINDINGS: Cardiac shadows within normal limits. The lungs are well aerated bilaterally. Minimal scarring is noted in the right base stable from previous exam. No new focal infiltrate is seen. No bony abnormality is noted. IMPRESSION: Mild right basilar scarring.  No acute abnormality noted. Electronically Signed   By: Inez Catalina M.D.   On: 01/17/2019 18:18   Ct Head Wo Contrast  Result Date: 01/17/2019 CLINICAL DATA:  Altered mental status. EXAM: CT HEAD WITHOUT CONTRAST TECHNIQUE: Contiguous axial images were obtained from the base of the skull through the vertex without intravenous contrast. COMPARISON:  MRI dated December 22, 2018. FINDINGS: Brain: No evidence of acute infarction, hemorrhage, hydrocephalus, extra-axial collection or mass lesion/mass effect. Again noted is atrophy and chronic microvascular ischemic changes bilaterally. Vascular: No hyperdense vessel or unexpected calcification. Skull: Normal. Negative for fracture or focal lesion. Sinuses/Orbits: No acute finding. Other: None. IMPRESSION: No acute intracranial abnormality detected. Electronically Signed   By: Jamie Kato.D.  On: 01/17/2019 18:09   ASSESSMENT AND PLAN:   85 y.o. male with a known history of dementia, hyperlipidemia, previous history of empyema who presents to the hospital due to altered mental status and suspected UTI.  1.  Altered mental status-suspected to be metabolic encephalopathy due to underlying dementia combined with electrolyte abnormalities of hyponatremia and hypokalemia.  Patient's  urinalysis is negative for UTI. -We will hydrate the patient with IV fluids, follow mental status.  CT head was negative for acute pathology.  2.  Hyponatremia- mild and likely hypovolemic hypo-tonic in nature. Resolved with IV fluid hydration.  3.  Hypokalemia Replaced   4.  History of dementia-patient mental status has been slowly getting worse which could be worsening dementia.  Recently taken off his Aricept. We will get physical therapy consult to assess mobility.  5.  New onset atrial fibrillation-this was instantly noted in the ER while patient was on the monitor.  Patient is clinically asymptomatic. -  Continue metoprolol. Seen by cardiologist.  Recommended placing patient on Eliquis twice daily which has been started.  Follow-up on 2D echocardiogram.   DVT prophylaxis; patient already started on Eliquis   All the records are reviewed and case discussed with Care Management/Social Worker. Management plans discussed with the patient, family and they are in agreement. Called patient's spouse listed in the chart Mrs. Katharine Look and updated her on treatment plans.  All questions were answered.  She agrees with plan of care as outlined.  CODE STATUS: Full Code  TOTAL TIME TAKING CARE OF THIS PATIENT: 35 minutes.   More than 50% of the time was spent in counseling/coordination of care: YES  POSSIBLE D/C IN 2 DAYS, DEPENDING ON CLINICAL CONDITION.   [name] M.D on 01/18/2019 at 1:28 PM  Between 7am to 6pm - Pager - [phone]  After 6pm go to [email or web address] - Proofreader  [facility]  Office  [phone]  CC: Primary care physician; Albina Billet, MD  Note: This dictation was prepared with Dragon dictation along with smaller phrase technology. Any transcriptional errors that result from this process are unintentional.

## 2019-01-18 NOTE — Plan of Care (Signed)
  Problem: Pain Managment: Goal: General experience of comfort will improve Outcome: Completed/Met

## 2019-01-18 NOTE — Progress Notes (Signed)
*  PRELIMINARY RESULTS* Echocardiogram 2D Echocardiogram has been performed.  Jeffery [name] [name] 01/18/2019, 12:27 PM

## 2019-01-18 NOTE — Consult Note (Signed)
[facility] Cardiology  CARDIOLOGY CONSULT NOTE  Patient ID: Jeffery [name] MRN: [id number] DOB/AGE: 1931/07/13 85 y.o.  Admit date: 01/17/2019 Referring Physician Dr. Delman Kitten Primary Physician Dr. Benita Stabile Primary Cardiologist Dr. Bartholome Bill Reason for Consultation New onset atrial fibrillation   HPI: Jeffery [name] is an 85 year old male with a past medical history significant for advanced dementia, aortic stenosis, previous history of empyema, and hyperlipidemia who presented to the ED on 01/17/19 for altered mental status with a suspected UTI.  He was noted to be in new onset atrial fibrillation with rapid ventricular response.  He was also noted to be hyponatremic at 129,  hypokalemic at 3.2, and high sensitivity troponin was mildly elevated at 84.  Urinalysis was negative.    Jeffery [name] is a poor historian due to advanced dementia, but denies current chest pain/heart racing sensation. Unable to provide further history.   He is followed in outpatient cardiology by Dr. Ubaldo Glassing. Most recent echocardiogram on 11/21/18 revealed normal LV function with an EF estimated greater than 55% with mild LVH, moderate AS with a valvular area calculated at .99cm2.    Review of systems complete and found to be negative unless listed above     Past Medical History:  Diagnosis Date  . Does use hearing aid   . Empyema lung ([place]) 06/03/2016   MODERATE WBC PRESENT, PREDOMINANTLY PMN, STREP INTERMEDIUS  . Hyperlipidemia   . Pneumonia Nov/Dec 2017    Past Surgical History:  Procedure Laterality Date  . APPENDECTOMY  1971  . COLONOSCOPY  2014  . EYE SURGERY     cataract surgery   . GREEN LIGHT LASER TURP (TRANSURETHRAL RESECTION OF PROSTATE N/A 06/20/2018   Procedure: GREEN LIGHT LASER TURP (TRANSURETHRAL RESECTION OF PROSTATE;  Surgeon: Royston Cowper, MD;  Location: [facility] ORS;  Service: Urology;  Laterality: N/A;  . HERNIA REPAIR Left 2008  . INGUINAL HERNIA REPAIR Right 12/01/2016   Right inguinal hernia  repair with medium Ultra Pro mesh.  . INGUINAL HERNIA REPAIR Left 07/12/2018   Procedure: HERNIA REPAIR INGUINAL ADULT;  Surgeon: Jules Husbands, MD;  Location: [facility] ORS;  Service: General;  Laterality: Left;  . INSERTION OF MESH Right 12/01/2016   Procedure: INSERTION OF MESH;  Surgeon: Robert Bellow, MD;  Location: [facility] ORS;  Service: General;  Laterality: Right;  . TONSILLECTOMY  1944    Medications Prior to Admission  Medication Sig Dispense Refill Last Dose  . donepezil (ARICEPT) 23 MG TABS tablet Take 23 mg by mouth daily.   Past Week at Unknown time   Social History   Socioeconomic History  . Marital status: Married    Spouse name: Not on file  . Number of children: Not on file  . Years of education: Not on file  . Highest education level: Not on file  Occupational History  . Not on file  Social Needs  . Financial resource strain: Not on file  . Food insecurity    Worry: Not on file    Inability: Not on file  . Transportation needs    Medical: Not on file    Non-medical: Not on file  Tobacco Use  . Smoking status: Former Smoker    Packs/day: 1.00    Years: 10.00    Pack years: 10.00    Types: Cigarettes  . Smokeless tobacco: Never Used  . Tobacco comment: quit smoking in 1970  Substance and Sexual Activity  . Alcohol use: Yes  Comment: occ 3/week  . Drug use: No  . Sexual activity: Not on file  Lifestyle  . Physical activity    Days per week: Not on file    Minutes per session: Not on file  . Stress: Not on file  Relationships  . Social Herbalist on phone: Not on file    Gets together: Not on file    Attends religious service: Not on file    Active member of club or organization: Not on file    Attends meetings of clubs or organizations: Not on file    Relationship status: Not on file  . Intimate partner violence    Fear of current or ex partner: Not on file    Emotionally abused: Not on file    Physically abused: Not on file     Forced sexual activity: Not on file  Other Topics Concern  . Not on file  Social History Narrative  . Not on file    Family History  Problem Relation Age of Onset  . Heart attack Father   . ALS Sister   . Cancer Sister   . ALS Sister       Review of systems complete and found to be negative unless listed above      PHYSICAL EXAM  General: Well developed, well nourished, in no acute distress HEENT:  Normocephalic and atramatic Neck:  No JVD.  Lungs: Clear bilaterally to auscultation and percussion. Heart: Irregularly irregular, rate uncontrolled.  3/6 crescendo decrescendo systolic murmur appreciated.  No gallops or rubs  Abdomen: Bowel sounds are positive, abdomen soft and non-tender  Msk:  Back normal. Normal strength and tone for age. Extremities: No clubbing, cyanosis or edema.   Neuro: Demented Psych:  Demented   Labs:   Lab Results  Component Value Date   WBC 7.2 01/18/2019   HGB 13.0 01/18/2019   HCT 38.0 (L) 01/18/2019   MCV 87.6 01/18/2019   PLT 156 01/18/2019    Recent Labs  Lab 01/17/19 1519 01/18/19 0617  NA 129* 138  K 3.2* 3.6  CL 94* 106  CO2 24 24  BUN 28* 16  CREATININE 1.01 0.71  CALCIUM 8.4* 8.2*  PROT 6.2*  --   BILITOT 0.8  --   ALKPHOS 65  --   ALT 18  --   AST 16  --   GLUCOSE 111* 98   No results found for: CKTOTAL, CKMB, CKMBINDEX, TROPONINI  Lab Results  Component Value Date   CHOL 155 06/03/2016   Lab Results  Component Value Date   HDL 34 (L) 06/03/2016   Lab Results  Component Value Date   LDLCALC 108 (H) 06/03/2016   Lab Results  Component Value Date   TRIG 64 06/03/2016   Lab Results  Component Value Date   CHOLHDL 4.6 06/03/2016   No results found for: LDLDIRECT    Radiology: Dg Chest 2 View  Result Date: 01/17/2019 CLINICAL DATA:  Weakness EXAM: CHEST - 2 VIEW COMPARISON:  07/12/2018 FINDINGS: Cardiac shadows within normal limits. The lungs are well aerated bilaterally. Minimal scarring is noted in  the right base stable from previous exam. No new focal infiltrate is seen. No bony abnormality is noted. IMPRESSION: Mild right basilar scarring.  No acute abnormality noted. Electronically Signed   By: Inez Catalina M.D.   On: 01/17/2019 18:18   Ct Head Wo Contrast  Result Date: 01/17/2019 CLINICAL DATA:  Altered mental status. EXAM: CT HEAD  WITHOUT CONTRAST TECHNIQUE: Contiguous axial images were obtained from the base of the skull through the vertex without intravenous contrast. COMPARISON:  MRI dated December 22, 2018. FINDINGS: Brain: No evidence of acute infarction, hemorrhage, hydrocephalus, extra-axial collection or mass lesion/mass effect. Again noted is atrophy and chronic microvascular ischemic changes bilaterally. Vascular: No hyperdense vessel or unexpected calcification. Skull: Normal. Negative for fracture or focal lesion. Sinuses/Orbits: No acute finding. Other: None. IMPRESSION: No acute intracranial abnormality detected. Electronically Signed   By: Constance Holster M.D.   On: 01/17/2019 18:09   Mr Brain Wo Contrast  Result Date: 12/22/2018 CLINICAL DATA:  Memory loss. Gait disturbance. Confusion. Symptoms progressive over the last 2 years. EXAM: MRI HEAD WITHOUT CONTRAST TECHNIQUE: Multiplanar, multiecho pulse sequences of the brain and surrounding structures were obtained without intravenous contrast. COMPARISON:  None. FINDINGS: Brain: Diffusion imaging does not show any acute or subacute infarction. There chronic small-vessel ischemic changes throughout the pons. No focal cerebellar insult. Cerebral hemispheres show generalized atrophy. This may be slightly temporal lobe predominant. There are chronic small-vessel ischemic changes of the cerebral hemispheric white matter. Ventricular size appears in proportion to the degree of brain atrophy. No mass lesion, hemorrhage or extra-axial collection. Vascular: Major vessels at the base of the brain show flow. Skull and upper cervical spine: Negative  Sinuses/Orbits: Clear/normal Other: None IMPRESSION: No acute or reversible finding. Generalized atrophy, possibly somewhat temporal lobe predominant. Chronic small-vessel ischemic changes of the pons and cerebral hemispheric white matter. Electronically Signed   By: Nelson Chimes M.D.   On: 12/22/2018 14:05    EKG: Atrial fibrillation with rapid ventricular response   ASSESSMENT AND PLAN:  1.  New onset atrial fibrillation   -Remains in atrial fibrillation, rate is decently controlled at 103bpm  -Continue metoprolol; will consider increasing to 25mg  BID if BP can tolerate it   -Recommend discharging with Eliquis BID   -Echocardiogram pending  2.  Hyperkalemia   -Improved; continue potassium supplementation as needed   The history, physical exam findings, and plan of care were all discussed with Dr. Bartholome Bill, and all decision making was made in collaboration.     Signed: Avie Arenas PA-C 01/18/2019, 8:12 AM

## 2019-01-18 NOTE — Progress Notes (Signed)
Notify Dr. Stark Jock and asked if patient can have a sleeping aid tonight as patient is confused (has dementia) and impulsive today, order given for seroquel 25 mg prn. RN will continue to monitor.

## 2019-01-18 NOTE — Plan of Care (Signed)
  Problem: Education: Goal: Knowledge of General Education information will improve Description: Including pain rating scale, medication(s)/side effects and non-pharmacologic comfort measures Outcome: Progressing   Problem: Health Behavior/Discharge Planning: Goal: Ability to manage health-related needs will improve Outcome: Progressing   Problem: Clinical Measurements: Goal: Ability to maintain clinical measurements within normal limits will improve Outcome: Progressing Goal: Will remain free from infection Outcome: Progressing Goal: Diagnostic test results will improve Outcome: Progressing Goal: Respiratory complications will improve Outcome: Progressing Goal: Cardiovascular complication will be avoided Outcome: Progressing   Problem: Activity: Goal: Risk for activity intolerance will decrease Outcome: Progressing   Problem: Nutrition: Goal: Adequate nutrition will be maintained Outcome: Progressing   Problem: Coping: Goal: Level of anxiety will decrease Outcome: Progressing   Problem: Elimination: Goal: Will not experience complications related to bowel motility Outcome: Progressing Goal: Will not experience complications related to urinary retention Outcome: Progressing   Problem: Safety: Goal: Ability to remain free from injury will improve Outcome: Progressing   Problem: Skin Integrity: Goal: Risk for impaired skin integrity will decrease Outcome: Progressing   Problem: Urinary Elimination: Goal: Signs and symptoms of infection will decrease Outcome: Progressing

## 2019-01-18 NOTE — Progress Notes (Signed)
Pt ambulated 160 ft with nursing using a front wheeled walker.  Gait slightly unsteady.  Tolerated activity well.  OOB in chair x90 minutes this AM.  PT updated; evaluation discontinued.

## 2019-01-19 LAB — MAGNESIUM: Magnesium: 2.1 mg/dL (ref 1.7–2.4)

## 2019-01-19 LAB — NOVEL CORONAVIRUS, NAA (HOSP ORDER, SEND-OUT TO REF LAB; TAT 18-24 HRS): SARS-CoV-2, NAA: NOT DETECTED

## 2019-01-19 LAB — BASIC METABOLIC PANEL
Anion gap: 6 (ref 5–15)
BUN: 10 mg/dL (ref 8–23)
CO2: 22 mmol/L (ref 22–32)
Calcium: 8.2 mg/dL — ABNORMAL LOW (ref 8.9–10.3)
Chloride: 112 mmol/L — ABNORMAL HIGH (ref 98–111)
Creatinine, Ser: 0.7 mg/dL (ref 0.61–1.24)
GFR calc Af Amer: >60 mL/min (ref >60)
GFR calc non Af Amer: >60 mL/min (ref >60)
Glucose, Bld: 99 mg/dL (ref 70–99)
Potassium: 3.6 mmol/L (ref 3.5–5.1)
Sodium: 140 mmol/L (ref 135–145)

## 2019-01-19 MED ORDER — HALOPERIDOL LACTATE 5 MG/ML IJ SOLN
5.0000 mg | Freq: Once | INTRAMUSCULAR | Status: AC
  Administered 2019-01-19: 5 mg via INTRAMUSCULAR
  Filled 2019-01-19: qty 1

## 2019-01-19 MED ORDER — METOPROLOL TARTRATE 25 MG PO TABS
25.0000 mg | ORAL_TABLET | Freq: Two times a day (BID) | ORAL | Status: DC
  Administered 2019-01-19 – 2019-01-20 (×2): 25 mg via ORAL
  Filled 2019-01-19 (×2): qty 1

## 2019-01-19 MED ORDER — METOPROLOL TARTRATE 5 MG/5ML IV SOLN
5.0000 mg | INTRAVENOUS | Status: AC
  Administered 2019-01-19: 5 mg via INTRAVENOUS
  Filled 2019-01-19: qty 5

## 2019-01-19 NOTE — Progress Notes (Signed)
Patient continues to be awake and attempting to get out of bed. Difficult to reorient. Patient resting intermittently but not asleep. HR increased to 130-140/s. Gardiner Barefoot NP notified. Dose of IV metoprolol given. Will continue to monitor. This RN sitting at bedside for safety.

## 2019-01-19 NOTE — Progress Notes (Signed)
Patient slightly calmer after haldol. Able to move back to room and get into bed. This Rn continues to sit with patient for safety as he is still unable to be reoriented and continues to report seeing things that are not there which are leading him to try to get up.

## 2019-01-19 NOTE — Progress Notes (Signed)
Patient Name: Jeffery [name] Date of Encounter: 01/19/2019  Hospital Problem List     Active Problems:   Altered mental status   AMS (altered mental status)    Patient Profile     Patient was admitted with altered mental status.  He has transient atrial fibrillation.  Started on rate control with metoprolol and anticoagulated with Eliquis for now.  Subjective   Limited historian  Inpatient Medications    . apixaban  5 mg Oral BID  . metoprolol tartrate  25 mg Oral BID  . potassium chloride  20 mEq Oral Daily    Vital Signs    Vitals:   01/19/19 0253 01/19/19 0423 01/19/19 0603 01/19/19 0753  BP: (!) 147/61 109/75 109/70 118/79  Pulse: 84 (!) 138 73 80  Resp:    19  Temp: 97.9 F (36.6 C)   97.6 F (36.4 C)  TempSrc: Oral   Oral  SpO2: 99% 97% 98% 99%  Weight:      Height:        Intake/Output Summary (Last 24 hours) at 01/19/2019 1129 Last data filed at 01/19/2019 0302 Gross per 24 hour  Intake 1857.58 ml  Output 675 ml  Net 1182.58 ml   Filed Weights   01/17/19 1504 01/17/19 2152  Weight: 68 kg 68.4 kg    Physical Exam    GEN: Well nourished, well developed, in no acute distress.  HEENT: normal.  Neck: Supple, no JVD, carotid bruits, or masses. Cardiac: RRR, no murmurs, rubs, or gallops. No clubbing, cyanosis, edema.  Radials/DP/PT 2+ and equal bilaterally.  Respiratory:  Respirations regular and unlabored, clear to auscultation bilaterally. GI: Soft, nontender, nondistended, BS + x 4. MS: no deformity or atrophy. Skin: warm and dry, no rash. Neuro:  Strength and sensation are intact. Psych: Normal affect.  Labs    CBC Recent Labs    01/17/19 1519 01/18/19 0617  WBC 10.5 7.2  HGB 12.8* 13.0  HCT 37.7* 38.0*  MCV 87.5 87.6  PLT 162 681   Basic Metabolic Panel Recent Labs    01/17/19 1744 01/18/19 0617 01/19/19 0554  NA  --  138 140  K  --  3.6 3.6  CL  --  106 112*  CO2  --  24 22  GLUCOSE  --  98 99  BUN  --  16 10  CREATININE   --  0.71 0.70  CALCIUM  --  8.2* 8.2*  MG 2.0  --  2.1   Liver Function Tests Recent Labs    01/17/19 1519  AST 16  ALT 18  ALKPHOS 65  BILITOT 0.8  PROT 6.2*  ALBUMIN 3.6   No results for input(s): LIPASE, AMYLASE in the last 72 hours. Cardiac Enzymes No results for input(s): CKTOTAL, CKMB, CKMBINDEX, TROPONINI in the last 72 hours. BNP Recent Labs    01/17/19 1744  BNP 741.0*   D-Dimer No results for input(s): DDIMER in the last 72 hours. Hemoglobin A1C No results for input(s): HGBA1C in the last 72 hours. Fasting Lipid Panel No results for input(s): CHOL, HDL, LDLCALC, TRIG, CHOLHDL, LDLDIRECT in the last 72 hours. Thyroid Function Tests No results for input(s): TSH, T4TOTAL, T3FREE, THYROIDAB in the last 72 hours.  Invalid input(s): FREET3  Telemetry    Sinus rhythm with intermittent atrial fibrillation with rapid ventricular response  ECG    A. fib with RVR  Radiology    Dg Chest 2 View  Result Date: 01/17/2019 CLINICAL DATA:  Weakness EXAM: CHEST - 2 VIEW COMPARISON:  07/12/2018 FINDINGS: Cardiac shadows within normal limits. The lungs are well aerated bilaterally. Minimal scarring is noted in the right base stable from previous exam. No new focal infiltrate is seen. No bony abnormality is noted. IMPRESSION: Mild right basilar scarring.  No acute abnormality noted. Electronically Signed   By: Inez Catalina M.D.   On: 01/17/2019 18:18   Ct Head Wo Contrast  Result Date: 01/17/2019 CLINICAL DATA:  Altered mental status. EXAM: CT HEAD WITHOUT CONTRAST TECHNIQUE: Contiguous axial images were obtained from the base of the skull through the vertex without intravenous contrast. COMPARISON:  MRI dated December 22, 2018. FINDINGS: Brain: No evidence of acute infarction, hemorrhage, hydrocephalus, extra-axial collection or mass lesion/mass effect. Again noted is atrophy and chronic microvascular ischemic changes bilaterally. Vascular: No hyperdense vessel or unexpected  calcification. Skull: Normal. Negative for fracture or focal lesion. Sinuses/Orbits: No acute finding. Other: None. IMPRESSION: No acute intracranial abnormality detected. Electronically Signed   By: Constance Holster M.D.   On: 01/17/2019 18:09   Mr Brain Wo Contrast  Result Date: 12/22/2018 CLINICAL DATA:  Memory loss. Gait disturbance. Confusion. Symptoms progressive over the last 2 years. EXAM: MRI HEAD WITHOUT CONTRAST TECHNIQUE: Multiplanar, multiecho pulse sequences of the brain and surrounding structures were obtained without intravenous contrast. COMPARISON:  None. FINDINGS: Brain: Diffusion imaging does not show any acute or subacute infarction. There chronic small-vessel ischemic changes throughout the pons. No focal cerebellar insult. Cerebral hemispheres show generalized atrophy. This may be slightly temporal lobe predominant. There are chronic small-vessel ischemic changes of the cerebral hemispheric white matter. Ventricular size appears in proportion to the degree of brain atrophy. No mass lesion, hemorrhage or extra-axial collection. Vascular: Major vessels at the base of the brain show flow. Skull and upper cervical spine: Negative Sinuses/Orbits: Clear/normal Other: None IMPRESSION: No acute or reversible finding. Generalized atrophy, possibly somewhat temporal lobe predominant. Chronic small-vessel ischemic changes of the pons and cerebral hemispheric white matter. Electronically Signed   By: Nelson Chimes M.D.   On: 12/22/2018 14:05    Assessment & Plan    Atrial fibrillation-intermittent sinus rhythm however when in atrial fibrillation rate is still not well controlled especially with activity.  Given an IV dose of metoprolol early this morning.  Will increase metoprolol tartrate to 25 mg twice daily and continue with Eliquis following for evidence of bleeding or falling.  Signed, Javier Docker Fath MD 01/19/2019, 11:29 AM  Pager: [phone]

## 2019-01-19 NOTE — Progress Notes (Signed)
[facility] [place] NAME: Jeffery [name]    MR#:  [id number]  DATE OF BIRTH:  1930-09-16  SUBJECTIVE:  CHIEF COMPLAINT:   Chief Complaint  Patient presents with  . Altered Mental Status  . Recurrent UTI   Heart rate was reported to have been elevated and patient given IV metoprolol this morning.  Dose of metoprolol p.o. increased by cardiologist.  Patient sitting up in bed.  No chest pain.  No shortness of breath.  Pleasantly confused at baseline  REVIEW OF SYSTEMS:  ROS Unobtainable due to underlying medical condition. DRUG ALLERGIES:  No Known Allergies VITALS:  Blood pressure 118/79, pulse 80, temperature 97.6 F (36.4 C), temperature source Oral, resp. rate 19, height 5\' 11"  (1.803 m), weight 68.4 kg, SpO2 99 %. PHYSICAL EXAMINATION:  Physical Exam  GENERAL:  85 y.o.-year-old patient lying in the bed in no acute distress.  EYES: Pupils equal, round, reactive to light and accommodation. No scleral icterus. Extraocular muscles intact.  HEENT: Head atraumatic, normocephalic. Oropharynx and nasopharynx clear. No oropharyngeal erythema, moist oral mucosa  NECK:  Supple, no jugular venous distention. No thyroid enlargement, no tenderness.  LUNGS: Normal breath sounds bilaterally, no wheezing, rales, rhonchi. No use of accessory muscles of respiration.  CARDIOVASCULAR: S1, S2 RRR. No murmurs, rubs, gallops, clicks.  ABDOMEN: Soft, nontender, nondistended. Bowel sounds present. No organomegaly or mass.  EXTREMITIES: No pedal edema, cyanosis, or clubbing. + 2 pedal & radial pulses b/l.   NEUROLOGIC: Moving all extremities with no focal deficit.  Generalized weakness.  PSYCHIATRIC: The patient is alert and oriented x 1.  Pleasantly confused SKIN: Noted diffuse spots /petechial rash which appears to be chronic.  Present on admission most likely   LABORATORY PANEL:  Male CBC Recent Labs  Lab 01/18/19 0617  WBC 7.2  HGB 13.0  HCT 38.0*   PLT 156   ------------------------------------------------------------------------------------------------------------------ Chemistries  Recent Labs  Lab 01/17/19 1519  01/19/19 0554  NA 129*   < > 140  K 3.2*   < > 3.6  CL 94*   < > 112*  CO2 24   < > 22  GLUCOSE 111*   < > 99  BUN 28*   < > 10  CREATININE 1.01   < > 0.70  CALCIUM 8.4*   < > 8.2*  MG  --    < > 2.1  AST 16  --   --   ALT 18  --   --   ALKPHOS 65  --   --   BILITOT 0.8  --   --    < > = values in this interval not displayed.   RADIOLOGY:  No results found. ASSESSMENT AND PLAN:   85 y.o. male with a known history of dementia, hyperlipidemia, previous history of empyema who presents to the hospital due to altered mental status and suspected UTI.  1.  Altered mental status-suspected to be metabolic encephalopathy due to underlying dementia combined with electrolyte abnormalities of hyponatremia and hypokalemia.  Patient's urinalysis is negative for UTI. -We will hydrate the patient with IV fluids, follow mental status.  CT head was negative for acute pathology.  2.  Hyponatremia- mild and likely hypovolemic hypo-tonic in nature. Resolved with IV fluid hydration.  3.  Hypokalemia Replaced   4.  History of dementia-patient mental status has been slowly getting worse which could be worsening dementia.  Recently taken off his Aricept by her wife.  Wife  wishes to follow-up with neurologist before resuming Aricept. Follow-up on physical therapy evaluation.  5.  New onset atrial fibrillation- appears to be paroxysmal Given a dose of IV metoprolol this morning.  Dose of Lopressor increased from 12.5 to 25 mg p.o. twice daily.  Continue Eliquis which was initiated by cardiologist during this admission. 2D echocardiogram done with ejection fraction of 50 to 55%.  DVT prophylaxis; patient already started on Eliquis   All the records are reviewed and case discussed with Care Management/Social Worker.  Management plans discussed with the patient, family and they are in agreement. Called patient's spouse listed in the chart Mrs. Katharine Look for further updates today.  No response.  Left voice message.  Anticipate discharge tomorrow if heart rate better controlled  CODE STATUS: Full Code  TOTAL TIME TAKING CARE OF THIS PATIENT: 34 minutes.   More than 50% of the time was spent in counseling/coordination of care: YES  POSSIBLE D/C IN 2 DAYS, DEPENDING ON CLINICAL CONDITION.   Jeffery [name] M.D on 01/19/2019 at 11:47 AM  Between 7am to 6pm - Pager - [phone]  After 6pm go to [email or web address] - Proofreader  [facility]  Office  [phone]  CC: Primary care physician; Albina Billet, MD  Note: This dictation was prepared with Dragon dictation along with smaller phrase technology. Any transcriptional errors that result from this process are unintentional.

## 2019-01-19 NOTE — Progress Notes (Signed)
Patient wanted to talked to his wife, while wife is on the phone also updated her about the plan for this patient. Asked wife is there is a possibility for her to sit with the patient if we don't have the staff to watch him today or tonight. And per wife, she needed help herself and also she can't drive at night since she have macular degeneration. Let her know that this RN is sitting with him for now since he is still impulsive to make sure he is safe. Wife also mentioned that whenever patient takes narcotics that it makes him more confused and agitated. No other concern at the moment. RN will continue to monitor.

## 2019-01-19 NOTE — Progress Notes (Signed)
Patient relocated to the hall for closer observation. This RN sitting with patient to keep closer eye on him. Patient continues to try to get up and is very impulsive. Patient seems to be more calm when engaged in conversation. Unable to get sitter for patient at this time. Notified NP Gardiner Barefoot about continued impulsive behavior and inability to get sitter. She ordered one time dose of Haldol IM. Patient given this dose. Will continue to monitor.

## 2019-01-20 LAB — BASIC METABOLIC PANEL
Anion gap: 9 (ref 5–15)
BUN: 7 mg/dL — ABNORMAL LOW (ref 8–23)
CO2: 23 mmol/L (ref 22–32)
Calcium: 8.4 mg/dL — ABNORMAL LOW (ref 8.9–10.3)
Chloride: 108 mmol/L (ref 98–111)
Creatinine, Ser: 0.73 mg/dL (ref 0.61–1.24)
GFR calc Af Amer: >60 mL/min (ref >60)
GFR calc non Af Amer: >60 mL/min (ref >60)
Glucose, Bld: 110 mg/dL — ABNORMAL HIGH (ref 70–99)
Potassium: 3.9 mmol/L (ref 3.5–5.1)
Sodium: 140 mmol/L (ref 135–145)

## 2019-01-20 LAB — MAGNESIUM: Magnesium: 2.1 mg/dL (ref 1.7–2.4)

## 2019-01-20 MED ORDER — APIXABAN 5 MG PO TABS: 5.0000 mg | ORAL_TABLET | Freq: Two times a day (BID) | ORAL | 0 refills | Status: AC

## 2019-01-20 MED ORDER — METOPROLOL TARTRATE 25 MG PO TABS: 25.0000 mg | ORAL_TABLET | Freq: Two times a day (BID) | ORAL | 0 refills | Status: AC

## 2019-01-20 NOTE — Progress Notes (Signed)
Pt discharged home via private car at 1200. Wife, Jeffery [name], came to pick pt up. AVS reviewed with wife and all questions were answered. Jeffery [name] verbalized understanding of discharge instructions. Pt was A&Ox2. VSS. Pt left with clothes and shoes.

## 2019-01-20 NOTE — Discharge Summary (Signed)
[facility] at [place] NAME: Jeffery [name]    MR#:  [id number]  DATE OF BIRTH:  Apr 25, 1931  DATE OF ADMISSION:  01/17/2019   ADMITTING PHYSICIAN: Henreitta Leber, MD  DATE OF DISCHARGE: 01/20/2019  PRIMARY CARE PHYSICIAN: Albina Billet, MD   ADMISSION DIAGNOSIS:  Hypokalemia [E87.6] Hyponatremia [E87.1] Paroxysmal atrial fibrillation ([place]) [I48.0] DISCHARGE DIAGNOSIS:  Active Problems:   Altered mental status   AMS (altered mental status)  SECONDARY DIAGNOSIS:   Past Medical History:  Diagnosis Date  . Does use hearing aid   . Empyema lung ([place]) 06/03/2016   MODERATE WBC PRESENT, PREDOMINANTLY PMN, STREP INTERMEDIUS  . Hyperlipidemia   . Pneumonia Nov/Dec 2017   HOSPITAL COURSE:  Chief complaint; altered mental status  History of presenting complaint; Jeffery [name]  is a 85 y.o. male with a known history of dementia, hyperlipidemia, previous history of empyema who presented to the hospital due to altered mental status and suspected UTI. Patient presented to the ER his urinalysis was negative.  Incidentally patient was noted to be mildly hyponatremic hypokalemic and also noted to be new onset atrial fibrillation.  Hospitalist services were contacted for admission.  Hospital course; 1. Altered mental status-suspected to be metabolic encephalopathy due to underlying dementia combined with electrolyte abnormalities of hyponatremia and hypokalemia. Patient's urinalysis is negative for UTI.  Patient was hydrated with IV fluids and hyponatremia resolved with recent sodium of 140.  Hypokalemia replaced.  CT head negative for any acute findings. Patient appears back to baseline.  Pleasantly confused at baseline.  I offered to prescribe PRN Seroquel for agitation at home but wife declined and wishes to follow-up with his neurologist post discharge.  2. Hyponatremia-mild and likely hypovolemic hypo-tonic in nature. Resolved with IV  fluid hydration.  3. Hypokalemia Replaced   4. History of dementia-patient mental status has been slowly getting worse which could be worsening dementia. Recently taken off his Aricept by her wife.  Wife wishes to follow-up with neurologist before resuming Aricept.  5. New onset atrial fibrillation- appears to be paroxysmal Heart rate currently controlled in the 80s with p.o. metoprolol 25 mg p.o. twice daily.  Seen by cardiologist.  Anticoagulation with Eliquis initiated.  Continue the same on discharge.  Follow-up with cardiologist as outpatient. 2D echocardiogram done with ejection fraction of 50 to 55%.  DISCHARGE CONDITIONS:  Stable CONSULTS OBTAINED:  Treatment Team:  Teodoro Spray, MD DRUG ALLERGIES:  No Known Allergies DISCHARGE MEDICATIONS:   Allergies as of 01/20/2019   No Known Allergies     Medication List    STOP taking these medications   donepezil 23 MG Tabs tablet Commonly known as: ARICEPT     TAKE these medications   apixaban 5 MG Tabs tablet Commonly known as: ELIQUIS Take 1 tablet (5 mg total) by mouth 2 (two) times daily.   metoprolol tartrate 25 MG tablet Commonly known as: LOPRESSOR Take 1 tablet (25 mg total) by mouth 2 (two) times daily.        DISCHARGE INSTRUCTIONS:   DIET:  Cardiac diet DISCHARGE CONDITION:  Stable ACTIVITY:  Activity as tolerated OXYGEN:  Home Oxygen: No.  Oxygen Delivery: room air DISCHARGE LOCATION:  home   If you experience worsening of your admission symptoms, develop shortness of breath, life threatening emergency, suicidal or homicidal thoughts you must seek medical attention immediately by calling 911 or calling your MD immediately  if symptoms less severe.  You Must  read complete instructions/literature along with all the possible adverse reactions/side effects for all the Medicines you take and that have been prescribed to you. Take any new Medicines after you have completely understood and  accpet all the possible adverse reactions/side effects.   Please note  You were cared for by a hospitalist during your hospital stay. If you have any questions about your discharge medications or the care you received while you were in the hospital after you are discharged, you can call the unit and asked to speak with the hospitalist on call if the hospitalist that took care of you is not available. Once you are discharged, your primary care physician will handle any further medical issues. Please note that NO REFILLS for any discharge medications will be authorized once you are discharged, as it is imperative that you return to your primary care physician (or establish a relationship with a primary care physician if you do not have one) for your aftercare needs so that they can reassess your need for medications and monitor your lab values.    On the day of Discharge:  VITAL SIGNS:  Blood pressure (!) 145/86, pulse 80, temperature 98.4 F (36.9 C), temperature source Oral, resp. rate 17, height 5\' 11"  (1.803 m), weight 68.4 kg, SpO2 96 %. PHYSICAL EXAMINATION:  GENERAL:  85 y.o.-year-old patient lying in the bed with no acute distress.  EYES: Pupils equal, round, reactive to light and accommodation. No scleral icterus. Extraocular muscles intact.  HEENT: Head atraumatic, normocephalic. Oropharynx and nasopharynx clear.  NECK:  Supple, no jugular venous distention. No thyroid enlargement, no tenderness.  LUNGS: Normal breath sounds bilaterally, no wheezing, rales,rhonchi or crepitation. No use of accessory muscles of respiration.  CARDIOVASCULAR: S1, S2 normal. No murmurs, rubs, or gallops.  ABDOMEN: Soft, non-tender, non-distended. Bowel sounds present. No organomegaly or mass.  EXTREMITIES: No pedal edema, cyanosis, or clubbing.  NEUROLOGIC: Cranial nerves II through XII are intact. Muscle strength 5/5 in all extremities. Sensation intact. PSYCHIATRIC: The patient is alert and oriented x 1.   SKIN:  Noted diffuse spots /petechial rash which appears to be chronic.  Present on admission most likely     DATA REVIEW:   CBC Recent Labs  Lab 01/18/19 0617  WBC 7.2  HGB 13.0  HCT 38.0*  PLT 156    Chemistries  Recent Labs  Lab 01/17/19 1519  01/20/19 0544  NA 129*   < > 140  K 3.2*   < > 3.9  CL 94*   < > 108  CO2 24   < > 23  GLUCOSE 111*   < > 110*  BUN 28*   < > 7*  CREATININE 1.01   < > 0.73  CALCIUM 8.4*   < > 8.4*  MG  --    < > 2.1  AST 16  --   --   ALT 18  --   --   ALKPHOS 65  --   --   BILITOT 0.8  --   --    < > = values in this interval not displayed.     Microbiology Results  Results for orders placed or performed during the hospital encounter of 01/17/19  Urine culture     Status: Abnormal   Collection Time: 01/17/19  3:19 PM   Specimen: Urine, Random  Result Value Ref Range Status   Specimen Description   Final    URINE, RANDOM Performed at [facility], [address],  [place], [place] [address]    Special Requests   Final    NONE Performed at [facility], [address] [place], [place] [address]    Culture (A)  Final    <10,000 COLONIES/mL INSIGNIFICANT GROWTH Performed at [facility] [address], [place], [place] [address]    Report Status 01/18/2019 FINAL  Final  Novel Coronavirus,NAA,(SEND-OUT TO REF LAB - TAT 24-48 hrs); Hosp Order     Status: None   Collection Time: 01/17/19  7:14 PM   Specimen: Nasopharyngeal Swab; Respiratory  Result Value Ref Range Status   SARS-CoV-2, NAA NOT DETECTED NOT DETECTED Final    Comment: (NOTE) This test was developed and its performance characteristics determined by [employer]. This test has not been FDA cleared or approved. This test has been authorized by FDA under an Emergency Use Authorization (EUA). This test is only authorized for the duration of time the declaration that circumstances exist justifying the authorization of the emergency  use of in vitro diagnostic tests for detection of SARS-CoV-2 virus and/or diagnosis of COVID-19 infection under section 564(b)(1) of the Act, 21 U.S.C. 517OHY-0(V)(3), unless the authorization is terminated or revoked sooner. When diagnostic testing is negative, the possibility of a false negative result should be considered in the context of a patient's recent exposures and the presence of clinical signs and symptoms consistent with COVID-19. An individual without symptoms of COVID-19 and who is not shedding SARS-CoV-2 virus would expect to have a negative (not detected) result in this assay. Performed  At: [facility] [place], [place] [id number] Rush Farmer MD NI:[id number]    [place]  Final    Comment: Performed at [facility], [address] [place], [place] [address]    RADIOLOGY:  No results found.   Management plans discussed with the patient, family and they are in agreement.  CODE STATUS: Full Code   TOTAL TIME TAKING CARE OF THIS PATIENT: 39 minutes.    Nicki Furlan M.D on 01/20/2019 at 11:15 AM  Between 7am to 6pm - Pager - [phone]  After 6pm go to [email or web address] - Proofreader  [facility]  Office  [phone]  CC: Primary care physician; Albina Billet, MD   Note: This dictation was prepared with Dragon dictation along with smaller phrase technology. Any transcriptional errors that result from this process are unintentional.

## 2019-01-23 ENCOUNTER — Ambulatory Visit: Payer: Medicare Other | Attending: Internal Medicine | Admitting: Physical Therapy

## 2019-01-23 ENCOUNTER — Encounter: Payer: Self-pay | Admitting: Physical Therapy

## 2019-01-23 ENCOUNTER — Other Ambulatory Visit: Payer: Self-pay

## 2019-01-23 DIAGNOSIS — R293 Abnormal posture: Secondary | ICD-10-CM | POA: Diagnosis present

## 2019-01-23 DIAGNOSIS — R2681 Unsteadiness on feet: Secondary | ICD-10-CM | POA: Diagnosis present

## 2019-01-23 DIAGNOSIS — R2689 Other abnormalities of gait and mobility: Secondary | ICD-10-CM | POA: Diagnosis not present

## 2019-01-23 DIAGNOSIS — M6281 Muscle weakness (generalized): Secondary | ICD-10-CM | POA: Diagnosis present

## 2019-01-25 NOTE — Therapy (Signed)
[place] MAIN [facility] SERVICES [address] [place], [place], [address] Phone: [phone]   Fax:  [phone]  Physical Therapy Evaluation  Patient Details  Name: Jeffery [name] MRN: [id number] Date of Birth: April 20, 1931 Referring Provider (PT): Dr. Benita Stabile PCP   Encounter Date: 01/23/2019  PT End of Session - 01/25/19 1028    Visit Number  1    Number of Visits  17    Date for PT Re-Evaluation  03/22/19    Authorization Type  start of care 01/23/19    PT Start Time  1102    PT Stop Time  1158    PT Time Calculation (min)  56 min    Equipment Utilized During Treatment  Gait belt    Activity Tolerance  Patient tolerated treatment well    Behavior During Therapy  [facility] for tasks assessed/performed       Past Medical History:  Diagnosis Date  . Does use hearing aid   . Empyema lung ([place]) 06/03/2016   MODERATE WBC PRESENT, PREDOMINANTLY PMN, STREP INTERMEDIUS  . Hyperlipidemia   . Pneumonia Nov/Dec 2017    Past Surgical History:  Procedure Laterality Date  . APPENDECTOMY  1971  . COLONOSCOPY  2014  . EYE SURGERY     cataract surgery   . GREEN LIGHT LASER TURP (TRANSURETHRAL RESECTION OF PROSTATE N/A 06/20/2018   Procedure: GREEN LIGHT LASER TURP (TRANSURETHRAL RESECTION OF PROSTATE;  Surgeon: Royston Cowper, MD;  Location: [facility] ORS;  Service: Urology;  Laterality: N/A;  . HERNIA REPAIR Left 2008  . INGUINAL HERNIA REPAIR Right 12/01/2016   Right inguinal hernia repair with medium Ultra Pro mesh.  . INGUINAL HERNIA REPAIR Left 07/12/2018   Procedure: HERNIA REPAIR INGUINAL ADULT;  Surgeon: Jules Husbands, MD;  Location: [facility] ORS;  Service: General;  Laterality: Left;  . INSERTION OF MESH Right 12/01/2016   Procedure: INSERTION OF MESH;  Surgeon: Robert Bellow, MD;  Location: [facility] ORS;  Service: General;  Laterality: Right;  . TONSILLECTOMY  1944    There were no vitals filed for this visit.   Subjective Assessment - 01/25/19  0917    Subjective  "I need to walk better and reduce my fall risk."    Pertinent History  85 yo Male s/p prostate surgery in Dec 2019 and had subsequent PT in Jan 2020. PT did not help but was suggested that patient receive outpatient PT for weakness and imbalance. He was scheduled to start PT in April 2020 but then was unable to start due to coronavirus. Patient is now wanting to start PT. He has been having trouble with his memory and was recently started on Aricept. However patient started getting confused, wife suspected UTI, patient went to hospital and was diagnosed with abnormal labs and a-fib. They are waiting to hear from cardiologist and neurologist regarding medication and when to resume aricept. He has had 2 falls in last week; He does take frequent naps and will sleep well at night; He does continue to have urinary leakage due to prostate surgery; In addition, patient is very hard of hearing; He denies any dizziness; He denies any numbness/tingling in feet; He uses a SPC intermittently; he is not consistent with using it due to poor memory; He does enjoy walking the dog multiple times a day but only goes a short distance; He does report having to be careful to not let the dog pull him over or trip him;  Limitations  Standing;Walking    How long can you sit comfortably?  NA    How long can you stand comfortably?  10-15 min;    How long can you walk comfortably?  1-2 blocks    Diagnostic tests  MRI- no acute abnormality, does show chronic ischemic disease    Patient Stated Goals  "Improve walking ability, reduce fall risk"    Currently in Pain?  No/denies    Multiple Pain Sites  No         [facility] PT Assessment - 01/25/19 0001      Assessment   Medical Diagnosis  Muscle weakness/Imbalance    Referring Provider (PT)  Dr. Benita Stabile PCP    Onset Date/Surgical Date  --   over last year   Hand Dominance  Left    Next MD Visit  01/23/19    Prior Therapy  had PT following prostate  surgery, denies any PT for this condition;       Precautions   Precautions  Fall      Restrictions   Weight Bearing Restrictions  No      Balance Screen   Has the patient fallen in the past 6 months  Yes    How many times?  2+    Has the patient had a decrease in activity level because of a fear of falling?   No    Is the patient reluctant to leave their home because of a fear of falling?   No      Home Environment   Additional Comments  Lives in 2 story home but able to live on main floor; has 2-3 steps to enter house with railing; able to negotiate alternating feet;       Prior Function   Level of Independence  Independent;Independent with basic ADLs    Vocation  Retired    [employer]  retired Field seismologist    [place]  enjoys walking dog; some woodworking      Cognition   Overall Cognitive Status  --   decreased short term memory     Observation/Other Assessments   Observations  very nice gentleman; hard of hearing      Sensation   Light Touch  Appears Intact      Coordination   Gross Motor Movements are Fluid and Coordinated  Yes    Fine Motor Movements are Fluid and Coordinated  Yes    Finger Nose Finger Test  accurate bilaterally      Posture/Postural Control   Posture Comments  sits with forward flexed posture; able to self correct with cues; denies any back pain with erect posture;       ROM / Strength   AROM / PROM / Strength  AROM;Strength      AROM   Overall AROM Comments  BUE and BLE are [facility]      Strength   Overall Strength Comments  BLE grossly 4/5 with exception of hip grossly 4-/5      Transfers   Comments  able to transfer sit<>Stand without pushing on arm rest but does require supervision for safety;      Ambulation/Gait   Gait Comments  ambulates with reciprocal gait pattern, unsupported, mild increase in base of support with slight left foot drag, decreased hip/knee flexion during swing phase with slightly decreased step length       Standardized Balance Assessment   Five times sit to stand comments   13.2 sec without HHA (low  fall risk)    10 Meter Walk  0.98 m/s without AD (limited community ambulator)      Timed Up and Go Test   Normal TUG (seconds)  12.5    TUG Comments  <14 sec indicates low fall risk      High Level Balance   High Level Balance Comments  able to stand unsupported without loss of balance, feet together eyes closed immediate loss of balance                Objective measurements completed on examination: See above findings.    Will address HEP next visit after patient follows up with MD regarding recent hospitalization;           PT Education - 01/25/19 1028    Education Details  plan of care/recommendations    Person(s) Educated  Patient    Methods  Explanation    Comprehension  Verbalized understanding       PT Short Term Goals - 01/25/19 1036      PT SHORT TERM GOAL #1   Title  Patient will be adherent to HEP at least 3x a week to improve functional strength and balance for better safety at home.    Time  4    Period  Weeks    Status  New    Target Date  02/22/19      PT SHORT TERM GOAL #2   Title  Patient will deny any falls over past 2 weeks to demonstrate improved safety awareness at home and work.    Time  4    Period  Weeks    Status  New    Target Date  02/22/19        PT Long Term Goals - 01/25/19 1037      PT LONG TERM GOAL #1   Title  Patient will increase BLE gross strength to 4+/5 as to improve functional strength for independent gait, increased standing tolerance and increased ADL ability.    Time  8    Period  Weeks    Status  New    Target Date  03/22/19      PT LONG TERM GOAL #2   Title  Patient will score >20 on mini best test to indicate low fall risk and exhibit improved general mobility with ADLs.    Time  8    Period  Weeks    Status  New    Target Date  03/22/19      PT LONG TERM GOAL #3   Title  Patient will be independent  in getting up from floor with minimal difficulty to improve fall recovery at home.    Time  8    Period  Weeks    Status  New    Target Date  03/22/19      PT LONG TERM GOAL #4   Title  Patient will increase 10 meter walk test to >1.52m/s as to improve gait speed for better community ambulation and to reduce fall risk.    Time  8    Period  Weeks    Status  New    Target Date  03/22/19             Plan - 01/25/19 1029    Clinical Impression Statement  85 yo Male reports increased instability and impaired gait over last year. Patient has had multiple medical issues including recent prostate surgery in Dec 2019 which has resulted in urinary leakage  and was recently hospitalized last week for altered mental status and UTI; He has had issues with his memory over last several months and was recently evaluated by neurology; Patient was started on aricept but had a drug reaction and was referred to hospital. He is following up with primary care physician later today to discuss medication moving forward. Patient is very hard of hearing which limited some history intake. Most recent MRI of brain does show chronic ischemic vessel changes but no acute abnormality; He was diagnosed with parkinsonianism type gait, however upon evaluation, patient ambulated with good reciprocal gait pattern, slight increase in base of support with occasional left foot drag but no significant shuffle. His wife reported that his walking is not usually as good as it was today; He does exhibit mild weakness in BLE hip and has a forward flexed posture which is corrected with verbal cues. Initial balance assessments indicate mild fall risk. Patient would benefit from skilled PT intervention to improve strength, balance and mobility;    Personal Factors and Comorbidities  Age;Comorbidity 3+;Past/Current Experience;Time since onset of injury/illness/exacerbation    Comorbidities  hard of hearing, decreased memory/cognition, high  fall risk with recent falls    Examination-Activity Limitations  Caring for Others;Carry;Continence;Lift;Locomotion Level;Stairs;Stand    Examination-Participation Restrictions  Church;Cleaning;Community Activity;Driving;Shop;Yard Work    Stability/Clinical Decision Making  Evolving/Moderate complexity    Clinical Decision Making  Moderate    Rehab Potential  Fair    PT Frequency  2x / week    PT Duration  8 weeks    PT Treatment/Interventions  Cryotherapy;Moist Heat;Gait training;Stair training;Functional mobility training;Therapeutic activities;Therapeutic exercise;Balance training;Neuromuscular re-education;Patient/family education;Orthotic Fit/Training;Energy conservation    PT Next Visit Plan  balance test (Mini best)/ address HEP    PT Home Exercise Plan  will address next visit    Consulted and Agree with Plan of Care  Patient       Patient will benefit from skilled therapeutic intervention in order to improve the following deficits and impairments:  Abnormal gait, Decreased activity tolerance, Decreased balance, Decreased endurance, Decreased knowledge of precautions, Decreased mobility, Decreased safety awareness, Decreased strength, Difficulty walking, Postural dysfunction  Visit Diagnosis: 1. Other abnormalities of gait and mobility   2. Muscle weakness (generalized)   3. Unsteadiness on feet        Problem List Patient Active Problem List   Diagnosis Date Noted  . AMS (altered mental status) 01/18/2019  . Altered mental status 01/17/2019  . Incarcerated inguinal hernia 07/12/2018  . Right inguinal hernia 11/25/2016  . Empyema lung ([place])   . Pleural effusion on right   . Pleural effusion, bacterial   . Empyema ([place]) 06/17/2016  . Impingement syndrome of shoulder region 02/25/2016  . Shoulder pain 02/25/2016  . Subacromial impingement 02/25/2016  . Mixed hyperlipidemia 04/01/2015  . Murmur, cardiac 04/01/2015    Sussie Minor PT, DPT 01/25/2019, 10:54  AM  [place] MAIN [facility] SERVICES [address] [place], [place], [address] Phone: [phone]   Fax:  [phone]  Name: Jeffery [name] MRN: [id number] Date of Birth: 06-26-1931

## 2019-01-30 ENCOUNTER — Other Ambulatory Visit: Payer: Self-pay

## 2019-01-30 ENCOUNTER — Encounter: Payer: Self-pay | Admitting: Physical Therapy

## 2019-01-30 ENCOUNTER — Ambulatory Visit: Payer: Medicare Other | Admitting: Physical Therapy

## 2019-01-30 DIAGNOSIS — R2689 Other abnormalities of gait and mobility: Secondary | ICD-10-CM

## 2019-01-30 DIAGNOSIS — R2681 Unsteadiness on feet: Secondary | ICD-10-CM

## 2019-01-30 DIAGNOSIS — M6281 Muscle weakness (generalized): Secondary | ICD-10-CM

## 2019-01-30 NOTE — Therapy (Signed)
[place] MAIN [facility] SERVICES [address] [place], [place], [address] Phone: [phone]   Fax:  [phone]  Physical Therapy Treatment  Patient Details  Name: Jeffery [name] MRN: [id number] Date of Birth: Jul 19, 1931 Referring Provider (PT): Dr. Benita Stabile PCP   Encounter Date: 01/30/2019  PT End of Session - 01/30/19 1112    Visit Number  2    Number of Visits  17    Date for PT Re-Evaluation  03/22/19    Authorization Type  start of care 01/23/19    PT Start Time  1105    PT Stop Time  1150    PT Time Calculation (min)  45 min    Equipment Utilized During Treatment  Gait belt    Activity Tolerance  Patient tolerated treatment well    Behavior During Therapy  [facility] for tasks assessed/performed       Past Medical History:  Diagnosis Date  . Does use hearing aid   . Empyema lung ([place]) 06/03/2016   MODERATE WBC PRESENT, PREDOMINANTLY PMN, STREP INTERMEDIUS  . Hyperlipidemia   . Pneumonia Nov/Dec 2017    Past Surgical History:  Procedure Laterality Date  . APPENDECTOMY  1971  . COLONOSCOPY  2014  . EYE SURGERY     cataract surgery   . GREEN LIGHT LASER TURP (TRANSURETHRAL RESECTION OF PROSTATE N/A 06/20/2018   Procedure: GREEN LIGHT LASER TURP (TRANSURETHRAL RESECTION OF PROSTATE;  Surgeon: Royston Cowper, MD;  Location: [facility] ORS;  Service: Urology;  Laterality: N/A;  . HERNIA REPAIR Left 2008  . INGUINAL HERNIA REPAIR Right 12/01/2016   Right inguinal hernia repair with medium Ultra Pro mesh.  . INGUINAL HERNIA REPAIR Left 07/12/2018   Procedure: HERNIA REPAIR INGUINAL ADULT;  Surgeon: Jules Husbands, MD;  Location: [facility] ORS;  Service: General;  Laterality: Left;  . INSERTION OF MESH Right 12/01/2016   Procedure: INSERTION OF MESH;  Surgeon: Robert Bellow, MD;  Location: [facility] ORS;  Service: General;  Laterality: Right;  . TONSILLECTOMY  1944    There were no vitals filed for this visit.  Subjective Assessment - 01/30/19  1112    Subjective  Patient reports doing well today; Denies any new falls;    Pertinent History  85 yo Male s/p prostate surgery in Dec 2019 and had subsequent PT in Jan 2020. PT did not help but was suggested that patient receive outpatient PT for weakness and imbalance. He was scheduled to start PT in April 2020 but then was unable to start due to coronavirus. Patient is now wanting to start PT. He has been having trouble with his memory and was recently started on Aricept. However patient started getting confused, wife suspected UTI, patient went to hospital and was diagnosed with abnormal labs and a-fib. They are waiting to hear from cardiologist and neurologist regarding medication and when to resume aricept. He has had 2 falls in last week; He does take frequent naps and will sleep well at night; He does continue to have urinary leakage due to prostate surgery; In addition, patient is very hard of hearing; He denies any dizziness; He denies any numbness/tingling in feet; He uses a SPC intermittently; he is not consistent with using it due to poor memory; He does enjoy walking the dog multiple times a day but only goes a short distance; He does report having to be careful to not let the dog pull him over or trip him;    Limitations  Standing;Walking    How long can you sit comfortably?  NA    How long can you stand comfortably?  10-15 min;    How long can you walk comfortably?  1-2 blocks    Diagnostic tests  MRI- no acute abnormality, does show chronic ischemic disease    Patient Stated Goals  "Improve walking ability, reduce fall risk"    Currently in Pain?  No/denies    Multiple Pain Sites  No         [facility] PT Assessment - 01/30/19 0001      Mini-BESTest   Sit To Stand  Normal: Comes to stand without use of hands and stabilizes independently.    Rise to Toes  Moderate: Heels up, but not full range (smaller than when holding hands), OR noticeable instability for 3 s.    Stand on one leg  (left)  Moderate: < 20 s    Stand on one leg (right)  Moderate: < 20 s    Stand on one leg - lowest score  1    Compensatory Stepping Correction - Forward  Moderate: More than one step is required to recover equilibrium    Compensatory Stepping Correction - Backward  No step, OR would fall if not caught, OR falls spontaneously.    Compensatory Stepping Correction - Left Lateral  Severe: Falls, or cannot step    Compensatory Stepping Correction - Right Lateral  Severe:  Falls, or cannot step    Stepping Corredtion Lateral - lowest score  0    Stance - Feet together, eyes open, firm surface   Normal: 30s    Stance - Feet together, eyes closed, foam surface   Severe: Unable    Incline - Eyes Closed  Normal: Stands independently 30s and aligns with gravity    Change in Gait Speed  Normal: Significantly changes walkling speed without imbalance    Walk with head turns - Horizontal  Normal: performs head turns with no change in gait speed and good balance    Walk with pivot turns  Moderate:Turns with feet close SLOW (>4 steps) with good balance.    Step over obstacles  Moderate: Steps over box but touches box OR displays cautious behavior by slowing gait.    Timed UP & GO with Dual Task  Severe: Stops counting while walking OR stops walking while counting.    Mini-BEST total score  15      Timed Up and Go Test   Normal TUG (seconds)  10.7    Cognitive TUG (seconds)  22.03    TUG Comments  significant difficulty with dual task cognitive TUG indicating increased risk for falls     TREATMENT: Warm up on crosstrainer, level 2 x5 min (Unbilled);   Instructed patient in Mini best test, see above; Patient does require increased cues for correct activity technique; He did have difficulty understanding reaction strategies and dual task TUG;  Instructed patient in HEP for balance exercise: Sit<>Stand from regular chair x5 reps;  Standing at parallel bars: -alternate high knee march x15 reps with 1  rail assist with cues to increase ROM for better hip strengthening and improved weight shift; -tandem stance on firm surface with 1-0 rail assist, 10 sec hold with intermittent hand hold x1 set each LE; -SLS on firm surface with 1-0 rail assist x10 sec hold with intermittent hand hold x1 rep each LE; -forward/backward walking on firm surface x10 feet x4 laps with supervision for safety; Patient required min VCs  for balance stability, including to increase trunk control for less loss of balance with smaller base of support  Provided written HEP for better adherence;                      PT Education - 01/30/19 1112    Education Details  balance/strength, HEP    Person(s) Educated  Patient;Spouse    Methods  Explanation;Verbal cues    Comprehension  Verbalized understanding;Returned demonstration;Verbal cues required;Need further instruction       PT Short Term Goals - 01/25/19 1036      PT SHORT TERM GOAL #1   Title  Patient will be adherent to HEP at least 3x a week to improve functional strength and balance for better safety at home.    Time  4    Period  Weeks    Status  New    Target Date  02/22/19      PT SHORT TERM GOAL #2   Title  Patient will deny any falls over past 2 weeks to demonstrate improved safety awareness at home and work.    Time  4    Period  Weeks    Status  New    Target Date  02/22/19        PT Long Term Goals - 01/25/19 1037      PT LONG TERM GOAL #1   Title  Patient will increase BLE gross strength to 4+/5 as to improve functional strength for independent gait, increased standing tolerance and increased ADL ability.    Time  8    Period  Weeks    Status  New    Target Date  03/22/19      PT LONG TERM GOAL #2   Title  Patient will score >20 on mini best test to indicate low fall risk and exhibit improved general mobility with ADLs.    Time  8    Period  Weeks    Status  New    Target Date  03/22/19      PT LONG TERM GOAL #3    Title  Patient will be independent in getting up from floor with minimal difficulty to improve fall recovery at home.    Time  8    Period  Weeks    Status  New    Target Date  03/22/19      PT LONG TERM GOAL #4   Title  Patient will increase 10 meter walk test to >1.82m/s as to improve gait speed for better community ambulation and to reduce fall risk.    Time  8    Period  Weeks    Status  New    Target Date  03/22/19            Plan - 01/30/19 1206    Clinical Impression Statement  Patient instructed in Mini Best test to assess balance. Patient did test as a high fall risk having increased difficulty with reaction strategies and standing on compliant surfaces. Patient also had significant difficulty with SLS and dual task TUG. He was instructed in advanced balance exercise as part of HEP. patient does require cues for correct exercise technique. He reports mild fatigue at end of session. he would benefit from additional skilled PT intervention to improve strength, balance and gait safety;    Personal Factors and Comorbidities  Age;Comorbidity 3+;Past/Current Experience;Time since onset of injury/illness/exacerbation    Comorbidities  hard of hearing, decreased memory/cognition, high  fall risk with recent falls    Examination-Activity Limitations  Caring for Others;Carry;Continence;Lift;Locomotion Level;Stairs;Stand    Examination-Participation Restrictions  Church;Cleaning;Community Activity;Driving;Shop;Yard Work    Stability/Clinical Decision Making  Evolving/Moderate complexity    Rehab Potential  Fair    PT Frequency  2x / week    PT Duration  8 weeks    PT Treatment/Interventions  Cryotherapy;Moist Heat;Gait training;Stair training;Functional mobility training;Therapeutic activities;Therapeutic exercise;Balance training;Neuromuscular re-education;Patient/family education;Orthotic Fit/Training;Energy conservation    PT Next Visit Plan  balance test (Mini best)/ address HEP     PT Home Exercise Plan  will address next visit    Consulted and Agree with Plan of Care  Patient       Patient will benefit from skilled therapeutic intervention in order to improve the following deficits and impairments:  Abnormal gait, Decreased activity tolerance, Decreased balance, Decreased endurance, Decreased knowledge of precautions, Decreased mobility, Decreased safety awareness, Decreased strength, Difficulty walking, Postural dysfunction  Visit Diagnosis: 1. Other abnormalities of gait and mobility   2. Muscle weakness (generalized)   3. Unsteadiness on feet        Problem List Patient Active Problem List   Diagnosis Date Noted  . AMS (altered mental status) 01/18/2019  . Altered mental status 01/17/2019  . Incarcerated inguinal hernia 07/12/2018  . Right inguinal hernia 11/25/2016  . Empyema lung ([place])   . Pleural effusion on right   . Pleural effusion, bacterial   . Empyema ([place]) 06/17/2016  . Impingement syndrome of shoulder region 02/25/2016  . Shoulder pain 02/25/2016  . Subacromial impingement 02/25/2016  . Mixed hyperlipidemia 04/01/2015  . Murmur, cardiac 04/01/2015    Aleicia Kenagy PT, DPT 01/30/2019, 12:21 PM  [place] MAIN [facility] SERVICES [address] [place], [place], [address] Phone: [phone]   Fax:  [phone]  Name: BREES HOUNSHELL MRN: [id number] Date of Birth: 06/26/1931

## 2019-01-30 NOTE — Patient Instructions (Addendum)
SIT TO STAND: No Device   Sit with feet shoulder-width apart, on floor.(Make sure that you are in a chair that won't move like a chair against a wall or couch etc) Lean chest forward, raise hips up from surface. Straighten hips and knees. Weight bear equally on left and right sides. 10___ reps per set, _2__ sets per day, _5__ days per week Place left leg closer to sitting surface.  Copyright  VHI. All rights reserved.  Backward Walking   Walk backward, toes of each foot coming down first. Take long, even strides. Make sure you have a clear pathway with no obstructions when you do this. Stand beside counter and walk backward  And then walk forward doing opposite directions; repeat 10 laps 2x a day at least 5 days a week.  Copyright  VHI. All rights reserved.  Tandem Walking   Stand beside kitchen sink and place one foot in front of the other, lift your hand and try to hold position for 10 sec. Repeat with other foot in front; Repeat 5 reps with each foot in front 5 days a week.Balance: Unilateral   Attempt to balance on left leg, eyes open. Hold _5-10___ seconds.Start with holding onto counter and if you get your balance you can try to let go of counter. Repeat __5__ times per set. Do __1__ sets per session. Do __1__ sessions per day. Keep eyes open:   http://orth.exer.us/29   Copyright  VHI. All rights reserved.    Knee High    Holding stable object, raise knee to hip level, then lower knee. Repeat with other knee. Repeat _10-15___ times. Do __2__ sessions per day.  http://gt2.exer.us/767   Copyright  VHI. All rights reserved.

## 2019-02-02 ENCOUNTER — Ambulatory Visit: Payer: Medicare Other

## 2019-02-02 ENCOUNTER — Other Ambulatory Visit: Payer: Self-pay

## 2019-02-02 DIAGNOSIS — R2689 Other abnormalities of gait and mobility: Secondary | ICD-10-CM

## 2019-02-02 DIAGNOSIS — R2681 Unsteadiness on feet: Secondary | ICD-10-CM

## 2019-02-02 DIAGNOSIS — R293 Abnormal posture: Secondary | ICD-10-CM

## 2019-02-02 DIAGNOSIS — M6281 Muscle weakness (generalized): Secondary | ICD-10-CM

## 2019-02-02 NOTE — Therapy (Signed)
[place] MAIN [facility] SERVICES [address] [place], [place], [address] Phone: [phone]   Fax:  [phone]  Physical Therapy Treatment  Patient Details  Name: Jeffery [name] MRN: [id number] Date of Birth: 1930/08/14 Referring Provider (PT): Dr. Benita Stabile PCP   Encounter Date: 02/02/2019  PT End of Session - 02/02/19 1140    Visit Number  3    Number of Visits  17    Date for PT Re-Evaluation  03/22/19    Authorization Type  start of care 01/23/19    PT Start Time  1045    PT Stop Time  1129    PT Time Calculation (min)  44 min    Equipment Utilized During Treatment  Gait belt    Activity Tolerance  Patient tolerated treatment well    Behavior During Therapy  [facility] for tasks assessed/performed       Past Medical History:  Diagnosis Date  . Does use hearing aid   . Empyema lung ([place]) 06/03/2016   MODERATE WBC PRESENT, PREDOMINANTLY PMN, STREP INTERMEDIUS  . Hyperlipidemia   . Pneumonia Nov/Dec 2017    Past Surgical History:  Procedure Laterality Date  . APPENDECTOMY  1971  . COLONOSCOPY  2014  . EYE SURGERY     cataract surgery   . GREEN LIGHT LASER TURP (TRANSURETHRAL RESECTION OF PROSTATE N/A 06/20/2018   Procedure: GREEN LIGHT LASER TURP (TRANSURETHRAL RESECTION OF PROSTATE;  Surgeon: Royston Cowper, MD;  Location: [facility] ORS;  Service: Urology;  Laterality: N/A;  . HERNIA REPAIR Left 2008  . INGUINAL HERNIA REPAIR Right 12/01/2016   Right inguinal hernia repair with medium Ultra Pro mesh.  . INGUINAL HERNIA REPAIR Left 07/12/2018   Procedure: HERNIA REPAIR INGUINAL ADULT;  Surgeon: Jules Husbands, MD;  Location: [facility] ORS;  Service: General;  Laterality: Left;  . INSERTION OF MESH Right 12/01/2016   Procedure: INSERTION OF MESH;  Surgeon: Robert Bellow, MD;  Location: [facility] ORS;  Service: General;  Laterality: Right;  . TONSILLECTOMY  1944    There were no vitals filed for this visit.  Subjective Assessment - 02/02/19  1048    Subjective  Patient arrives with wife, reports compliance with HEP. Challenged with the tandem stance portion. No falls or LOB since last session.    Pertinent History  85 yo Male s/p prostate surgery in Dec 2019 and had subsequent PT in Jan 2020. PT did not help but was suggested that patient receive outpatient PT for weakness and imbalance. He was scheduled to start PT in April 2020 but then was unable to start due to coronavirus. Patient is now wanting to start PT. He has been having trouble with his memory and was recently started on Aricept. However patient started getting confused, wife suspected UTI, patient went to hospital and was diagnosed with abnormal labs and a-fib. They are waiting to hear from cardiologist and neurologist regarding medication and when to resume aricept. He has had 2 falls in last week; He does take frequent naps and will sleep well at night; He does continue to have urinary leakage due to prostate surgery; In addition, patient is very hard of hearing; He denies any dizziness; He denies any numbness/tingling in feet; He uses a SPC intermittently; he is not consistent with using it due to poor memory; He does enjoy walking the dog multiple times a day but only goes a short distance; He does report having to be careful to not let  the dog pull him over or trip him;    Limitations  Standing;Walking    How long can you sit comfortably?  NA    How long can you stand comfortably?  10-15 min;    How long can you walk comfortably?  1-2 blocks    Diagnostic tests  MRI- no acute abnormality, does show chronic ischemic disease    Patient Stated Goals  "Improve walking ability, reduce fall risk"    Currently in Pain?  No/denies       Crosstrainer: level 2 seat position: 3 minutes  Neuro Re-edpatient requires CGA for all standing interventions for reduction of LOB and to decrease falls risk.   Airex pad:  -Modified tandem stance 30 seconds each LE back 2 sets total, cueing  for positioning -balloon taps reaching inside and outside BOS with one major LOB requiring PT to perform Mod A to return patient to COM without falling.   Speed ladder in // bars one foot in each box for increasing step length and foot clearance for reduction of symptoms. Patient able to perform x 6 trials. No UE support, max verbal cueing for sequencing and placement of LE's  Speed ladder in // bars: step skipping over one box (two at a time) each foot for widened BOS x 2 trials with intermittent UE support .  Ambulate backwards in // bars with focus on upright posture and hip extension, CGA  Step over half foam roller 10x each LE, no UE support, cueing for hip flexion for reduction of scuffing of limb.    Ther Ex: patient requires CGA for all standing interventions for reduction of LOB and to decrease falls risk.   6" step: step ups 10x each LE, easier/patient preference to step with LLE first requiring cueing for use of RLE. No UE support.   6" step: lateral step ups 10x each LE, requires SUE support. Increased velocity to the right than to the left  Seated modified windmill with focus on upright posture between cross body LE tap. 8x each side  ambulate 60 ft in gym for comparison s/p ladder intervention with increased foot clearance and longer step length  Pt educated throughout session about proper posture and technique with exercises. Improved exercise technique, movement at target joints, use of target muscles after min to mod verbal, visual, tactile cues.             PT Education - 02/02/19 1139    Education Details  exercise technique, stability, body mechanics    Person(s) Educated  Patient    Methods  Explanation;Demonstration;Tactile cues;Verbal cues    Comprehension  Verbalized understanding;Returned demonstration;Verbal cues required;Tactile cues required       PT Short Term Goals - 01/25/19 1036      PT SHORT TERM GOAL #1   Title  Patient will be adherent  to HEP at least 3x a week to improve functional strength and balance for better safety at home.    Time  4    Period  Weeks    Status  New    Target Date  02/22/19      PT SHORT TERM GOAL #2   Title  Patient will deny any falls over past 2 weeks to demonstrate improved safety awareness at home and work.    Time  4    Period  Weeks    Status  New    Target Date  02/22/19        PT Long Term Goals -  01/25/19 1037      PT LONG TERM GOAL #1   Title  Patient will increase BLE gross strength to 4+/5 as to improve functional strength for independent gait, increased standing tolerance and increased ADL ability.    Time  8    Period  Weeks    Status  New    Target Date  03/22/19      PT LONG TERM GOAL #2   Title  Patient will score >20 on mini best test to indicate low fall risk and exhibit improved general mobility with ADLs.    Time  8    Period  Weeks    Status  New    Target Date  03/22/19      PT LONG TERM GOAL #3   Title  Patient will be independent in getting up from floor with minimal difficulty to improve fall recovery at home.    Time  8    Period  Weeks    Status  New    Target Date  03/22/19      PT LONG TERM GOAL #4   Title  Patient will increase 10 meter walk test to >1.30m/s as to improve gait speed for better community ambulation and to reduce fall risk.    Time  8    Period  Weeks    Status  New    Target Date  03/22/19            Plan - 02/02/19 1144    Clinical Impression Statement  Patient presents with good motivation to physical therapy. He is challenged with scuffing of LE's with ambulation with noted improvement after foot clearance interventions. Patient demonstrates posterior trunk lean requiring tactile cueing for correction to retain COM. Patient will benefit from additional skilled PT intervention to improve strength, balance, and gait safety.    Personal Factors and Comorbidities  Age;Comorbidity 3+;Past/Current Experience;Time since onset of  injury/illness/exacerbation    Comorbidities  hard of hearing, decreased memory/cognition, high fall risk with recent falls    Examination-Activity Limitations  Caring for Others;Carry;Continence;Lift;Locomotion Level;Stairs;Stand    Examination-Participation Restrictions  Church;Cleaning;Community Activity;Driving;Shop;Yard Work    Stability/Clinical Decision Making  Evolving/Moderate complexity    Rehab Potential  Fair    PT Frequency  2x / week    PT Duration  8 weeks    PT Treatment/Interventions  Cryotherapy;Moist Heat;Gait training;Stair training;Functional mobility training;Therapeutic activities;Therapeutic exercise;Balance training;Neuromuscular re-education;Patient/family education;Orthotic Fit/Training;Energy conservation    PT Next Visit Plan  foot clearance, strengthening    PT Home Exercise Plan  will address next visit    Consulted and Agree with Plan of Care  Patient       Patient will benefit from skilled therapeutic intervention in order to improve the following deficits and impairments:  Abnormal gait, Decreased activity tolerance, Decreased balance, Decreased endurance, Decreased knowledge of precautions, Decreased mobility, Decreased safety awareness, Decreased strength, Difficulty walking, Postural dysfunction  Visit Diagnosis: 1. Other abnormalities of gait and mobility   2. Muscle weakness (generalized)   3. Unsteadiness on feet   4. Abnormal posture        Problem List Patient Active Problem List   Diagnosis Date Noted  . AMS (altered mental status) 01/18/2019  . Altered mental status 01/17/2019  . Incarcerated inguinal hernia 07/12/2018  . Right inguinal hernia 11/25/2016  . Empyema lung ([place])   . Pleural effusion on right   . Pleural effusion, bacterial   . Empyema ([place]) 06/17/2016  . Impingement syndrome of  shoulder region 02/25/2016  . Shoulder pain 02/25/2016  . Subacromial impingement 02/25/2016  . Mixed hyperlipidemia 04/01/2015  . Murmur,  cardiac 04/01/2015   Janna Arch, PT, DPT   02/02/2019, 11:47 AM  [place] MAIN [facility] SERVICES [address] [place], [place], [address] Phone: [phone]   Fax:  [phone]  Name: Jeffery [name] MRN: [id number] Date of Birth: Apr 22, 1931

## 2019-02-05 ENCOUNTER — Ambulatory Visit: Payer: Medicare Other | Admitting: Physical Therapy

## 2019-02-05 ENCOUNTER — Encounter: Payer: Self-pay | Admitting: Physical Therapy

## 2019-02-05 ENCOUNTER — Other Ambulatory Visit: Payer: Self-pay

## 2019-02-05 DIAGNOSIS — R2681 Unsteadiness on feet: Secondary | ICD-10-CM

## 2019-02-05 DIAGNOSIS — R2689 Other abnormalities of gait and mobility: Secondary | ICD-10-CM | POA: Diagnosis not present

## 2019-02-05 DIAGNOSIS — M6281 Muscle weakness (generalized): Secondary | ICD-10-CM

## 2019-02-05 DIAGNOSIS — R293 Abnormal posture: Secondary | ICD-10-CM

## 2019-02-05 NOTE — Therapy (Signed)
[place] MAIN [facility] SERVICES [address] [place], [place], [address] Phone: [phone]   Fax:  [phone]  Physical Therapy Treatment  Patient Details  Name: Jeffery [name] MRN: [id number] Date of Birth: October 03, 1930 Referring Provider (PT): Dr. Benita Stabile PCP   Encounter Date: 02/05/2019  PT End of Session - 02/05/19 1039    Visit Number  4    Number of Visits  17    Date for PT Re-Evaluation  03/22/19    Authorization Type  start of care 01/23/19    PT Start Time  1045    PT Stop Time  1130    PT Time Calculation (min)  45 min    Equipment Utilized During Treatment  Gait belt    Activity Tolerance  Patient tolerated treatment well    Behavior During Therapy  [facility] for tasks assessed/performed       Past Medical History:  Diagnosis Date  . Does use hearing aid   . Empyema lung ([place]) 06/03/2016   MODERATE WBC PRESENT, PREDOMINANTLY PMN, STREP INTERMEDIUS  . Hyperlipidemia   . Pneumonia Nov/Dec 2017    Past Surgical History:  Procedure Laterality Date  . APPENDECTOMY  1971  . COLONOSCOPY  2014  . EYE SURGERY     cataract surgery   . GREEN LIGHT LASER TURP (TRANSURETHRAL RESECTION OF PROSTATE N/A 06/20/2018   Procedure: GREEN LIGHT LASER TURP (TRANSURETHRAL RESECTION OF PROSTATE;  Surgeon: Royston Cowper, MD;  Location: [facility] ORS;  Service: Urology;  Laterality: N/A;  . HERNIA REPAIR Left 2008  . INGUINAL HERNIA REPAIR Right 12/01/2016   Right inguinal hernia repair with medium Ultra Pro mesh.  . INGUINAL HERNIA REPAIR Left 07/12/2018   Procedure: HERNIA REPAIR INGUINAL ADULT;  Surgeon: Jules Husbands, MD;  Location: [facility] ORS;  Service: General;  Laterality: Left;  . INSERTION OF MESH Right 12/01/2016   Procedure: INSERTION OF MESH;  Surgeon: Robert Bellow, MD;  Location: [facility] ORS;  Service: General;  Laterality: Right;  . TONSILLECTOMY  1944    There were no vitals filed for this visit.  Subjective Assessment - 02/05/19  1045    Subjective  Patient arrives with wife; reports doing well; reports adherence with HEP; Denies any new falls; he denies any discomfort or pain;    Pertinent History  85 yo Male s/p prostate surgery in Dec 2019 and had subsequent PT in Jan 2020. PT did not help but was suggested that patient receive outpatient PT for weakness and imbalance. He was scheduled to start PT in April 2020 but then was unable to start due to coronavirus. Patient is now wanting to start PT. He has been having trouble with his memory and was recently started on Aricept. However patient started getting confused, wife suspected UTI, patient went to hospital and was diagnosed with abnormal labs and a-fib. They are waiting to hear from cardiologist and neurologist regarding medication and when to resume aricept. He has had 2 falls in last week; He does take frequent naps and will sleep well at night; He does continue to have urinary leakage due to prostate surgery; In addition, patient is very hard of hearing; He denies any dizziness; He denies any numbness/tingling in feet; He uses a SPC intermittently; he is not consistent with using it due to poor memory; He does enjoy walking the dog multiple times a day but only goes a short distance; He does report having to be careful to not let  the dog pull him over or trip him;    Limitations  Standing;Walking    How long can you sit comfortably?  NA    How long can you stand comfortably?  10-15 min;    How long can you walk comfortably?  1-2 blocks    Diagnostic tests  MRI- no acute abnormality, does show chronic ischemic disease    Patient Stated Goals  "Improve walking ability, reduce fall risk"    Currently in Pain?  No/denies    Multiple Pain Sites  No         TREATMENT: Warm up on Nustep BUE/BLE level 2 x4 min (Unbilled);   Patient instructed in advanced balance exercise  Standing in parallel bars:  Standing on airex foam: -alternate toe taps to 4 inch step with 2-1  rail assist x15 reps bilaterally; -Standing one foot on airex, one foot on 4 inch step, BUE ball pass side/side x5 reps each foot on step -modified tandem stance: head turns side/side x5 reps each foot in front; Patient required min VCs for balance stability, including to increase trunk control for less loss of balance with smaller base of support   Stepping over orange hurdle using 2# ankle weights to challenge LE strength:  Forward/backward with 2-1 rail assist x15 reps  Side step with 2-1 rail assist x15 reps each direction Required mod VCs to increase hip flexion and increase step length for better foot clearance  Instructed patient in dynamic balance exercise: Resisted walking, 12.5# forward/backward, side/side x4 way, x2 laps each direction; required min A for safety and cues to improve weight shift especially with eccentric control for better balance control.  Response to treatment: Patient very hard of hearing requiring increased instruction and demonstration for better activity technique; He was able to negotiate obstacles with less rail assist but does require cues to increase step length for better foot clearance and to improve weight shift for less heavy hitting on step down for better dynamic control. Patient does fatigue with prolonged standing requiring short seated rest break;                      PT Education - 02/05/19 1039    Education Details  exercise technique/balance; HEP reinforced;    Person(s) Educated  Patient    Methods  Explanation;Verbal cues    Comprehension  Verbalized understanding;Returned demonstration;Verbal cues required;Need further instruction       PT Short Term Goals - 01/25/19 1036      PT SHORT TERM GOAL #1   Title  Patient will be adherent to HEP at least 3x a week to improve functional strength and balance for better safety at home.    Time  4    Period  Weeks    Status  New    Target Date  02/22/19      PT SHORT TERM  GOAL #2   Title  Patient will deny any falls over past 2 weeks to demonstrate improved safety awareness at home and work.    Time  4    Period  Weeks    Status  New    Target Date  02/22/19        PT Long Term Goals - 01/25/19 1037      PT LONG TERM GOAL #1   Title  Patient will increase BLE gross strength to 4+/5 as to improve functional strength for independent gait, increased standing tolerance and increased ADL ability.  Time  8    Period  Weeks    Status  New    Target Date  03/22/19      PT LONG TERM GOAL #2   Title  Patient will score >20 on mini best test to indicate low fall risk and exhibit improved general mobility with ADLs.    Time  8    Period  Weeks    Status  New    Target Date  03/22/19      PT LONG TERM GOAL #3   Title  Patient will be independent in getting up from floor with minimal difficulty to improve fall recovery at home.    Time  8    Period  Weeks    Status  New    Target Date  03/22/19      PT LONG TERM GOAL #4   Title  Patient will increase 10 meter walk test to >1.80m/s as to improve gait speed for better community ambulation and to reduce fall risk.    Time  8    Period  Weeks    Status  New    Target Date  03/22/19            Plan - 02/05/19 1135    Clinical Impression Statement  Patient presents with good motivation and participated well within session. He was instructed in advanced balance tasks. Patient does require increased cues for better weight shift especially when stepping over obstacles for better dynamic control. He also requires frequent cues to increase step length especially against resistance. He would benefit from additional skilled PT intervention to improve strength, balance and gait safety;    Personal Factors and Comorbidities  Age;Comorbidity 3+;Past/Current Experience;Time since onset of injury/illness/exacerbation    Comorbidities  hard of hearing, decreased memory/cognition, high fall risk with recent falls     Examination-Activity Limitations  Caring for Others;Carry;Continence;Lift;Locomotion Level;Stairs;Stand    Examination-Participation Restrictions  Church;Cleaning;Community Activity;Driving;Shop;Yard Work    Stability/Clinical Decision Making  Evolving/Moderate complexity    Rehab Potential  Fair    PT Frequency  2x / week    PT Duration  8 weeks    PT Treatment/Interventions  Cryotherapy;Moist Heat;Gait training;Stair training;Functional mobility training;Therapeutic activities;Therapeutic exercise;Balance training;Neuromuscular re-education;Patient/family education;Orthotic Fit/Training;Energy conservation    PT Next Visit Plan  foot clearance, strengthening    PT Home Exercise Plan  will address next visit    Consulted and Agree with Plan of Care  Patient       Patient will benefit from skilled therapeutic intervention in order to improve the following deficits and impairments:  Abnormal gait, Decreased activity tolerance, Decreased balance, Decreased endurance, Decreased knowledge of precautions, Decreased mobility, Decreased safety awareness, Decreased strength, Difficulty walking, Postural dysfunction  Visit Diagnosis: 1. Other abnormalities of gait and mobility   2. Muscle weakness (generalized)   3. Unsteadiness on feet   4. Abnormal posture        Problem List Patient Active Problem List   Diagnosis Date Noted  . AMS (altered mental status) 01/18/2019  . Altered mental status 01/17/2019  . Incarcerated inguinal hernia 07/12/2018  . Right inguinal hernia 11/25/2016  . Empyema lung ([place])   . Pleural effusion on right   . Pleural effusion, bacterial   . Empyema ([place]) 06/17/2016  . Impingement syndrome of shoulder region 02/25/2016  . Shoulder pain 02/25/2016  . Subacromial impingement 02/25/2016  . Mixed hyperlipidemia 04/01/2015  . Murmur, cardiac 04/01/2015    Goro Wenrick PT, DPT 02/05/2019, 11:43 AM  [place] MAIN [facility]  SERVICES [address] [place], [place], [address] Phone: [phone]   Fax:  [phone]  Name: Jeffery [name] MRN: [id number] Date of Birth: 06/09/1931

## 2019-02-06 ENCOUNTER — Ambulatory Visit: Payer: Medicare Other | Admitting: Physical Therapy

## 2019-02-07 ENCOUNTER — Other Ambulatory Visit: Payer: Self-pay

## 2019-02-07 ENCOUNTER — Encounter: Payer: Self-pay | Admitting: Physical Therapy

## 2019-02-07 ENCOUNTER — Ambulatory Visit: Payer: Medicare Other | Admitting: Physical Therapy

## 2019-02-07 DIAGNOSIS — R2689 Other abnormalities of gait and mobility: Secondary | ICD-10-CM

## 2019-02-07 DIAGNOSIS — R2681 Unsteadiness on feet: Secondary | ICD-10-CM

## 2019-02-07 DIAGNOSIS — M6281 Muscle weakness (generalized): Secondary | ICD-10-CM

## 2019-02-07 DIAGNOSIS — R293 Abnormal posture: Secondary | ICD-10-CM

## 2019-02-07 NOTE — Therapy (Signed)
[place] MAIN [facility] SERVICES [address] [place], [place], [address] Phone: [phone]   Fax:  [phone]  Physical Therapy Treatment  Patient Details  Name: Jeffery [name] MRN: [id number] Date of Birth: Sep 13, 1930 Referring Provider (PT): Dr. Benita Stabile PCP   Encounter Date: 02/07/2019  PT End of Session - 02/07/19 1500    Visit Number  5    Number of Visits  17    Date for PT Re-Evaluation  03/22/19    Authorization Type  start of care 01/23/19    PT Start Time  1102    PT Stop Time  1145    PT Time Calculation (min)  43 min    Equipment Utilized During Treatment  Gait belt    Activity Tolerance  Patient tolerated treatment well;No increased pain    Behavior During Therapy  WFL for tasks assessed/performed       Past Medical History:  Diagnosis Date  . Does use hearing aid   . Empyema lung ([place]) 06/03/2016   MODERATE WBC PRESENT, PREDOMINANTLY PMN, STREP INTERMEDIUS  . Hyperlipidemia   . Pneumonia Nov/Dec 2017    Past Surgical History:  Procedure Laterality Date  . APPENDECTOMY  1971  . COLONOSCOPY  2014  . EYE SURGERY     cataract surgery   . GREEN LIGHT LASER TURP (TRANSURETHRAL RESECTION OF PROSTATE N/A 06/20/2018   Procedure: GREEN LIGHT LASER TURP (TRANSURETHRAL RESECTION OF PROSTATE;  Surgeon: Royston Cowper, MD;  Location: [facility] ORS;  Service: Urology;  Laterality: N/A;  . HERNIA REPAIR Left 2008  . INGUINAL HERNIA REPAIR Right 12/01/2016   Right inguinal hernia repair with medium Ultra Pro mesh.  . INGUINAL HERNIA REPAIR Left 07/12/2018   Procedure: HERNIA REPAIR INGUINAL ADULT;  Surgeon: Jules Husbands, MD;  Location: [facility] ORS;  Service: General;  Laterality: Left;  . INSERTION OF MESH Right 12/01/2016   Procedure: INSERTION OF MESH;  Surgeon: Robert Bellow, MD;  Location: [facility] ORS;  Service: General;  Laterality: Right;  . TONSILLECTOMY  1944    There were no vitals filed for this visit.  Subjective  Assessment - 02/07/19 1104    Subjective  Patient arrives with wife; reports doing well; reports adherence with HEP; Denies any new falls; he denies any discomfort or pain;    Pertinent History  85 yo Male s/p prostate surgery in Dec 2019 and had subsequent PT in Jan 2020. PT did not help but was suggested that patient receive outpatient PT for weakness and imbalance. He was scheduled to start PT in April 2020 but then was unable to start due to coronavirus. Patient is now wanting to start PT. He has been having trouble with his memory and was recently started on Aricept. However patient started getting confused, wife suspected UTI, patient went to hospital and was diagnosed with abnormal labs and a-fib. They are waiting to hear from cardiologist and neurologist regarding medication and when to resume aricept. He has had 2 falls in last week; He does take frequent naps and will sleep well at night; He does continue to have urinary leakage due to prostate surgery; In addition, patient is very hard of hearing; He denies any dizziness; He denies any numbness/tingling in feet; He uses a SPC intermittently; he is not consistent with using it due to poor memory; He does enjoy walking the dog multiple times a day but only goes a short distance; He does report having to be careful to  not let the dog pull him over or trip him;    Limitations  Standing;Walking    How long can you sit comfortably?  NA    How long can you stand comfortably?  10-15 min;    How long can you walk comfortably?  1-2 blocks    Diagnostic tests  MRI- no acute abnormality, does show chronic ischemic disease    Patient Stated Goals  "Improve walking ability, reduce fall risk"    Currently in Pain?  No/denies    Multiple Pain Sites  No              TREATMENT: Warm up on Nustep BUE/BLE level 2 x4 min (Unbilled);              Patient instructed in advanced balance exercise  Standing in parallel bars:  Standing on BOSU:   -heel/toe raises x15 with B rail assist with mod VCs to increase ankle AROM and reduce weight shift through trunk for better ankle strategies; -alternate march x15 with B rail assist with cues to increase hip flexion for better weight shift and increased balance challenge; -Standing with feet slightly apart, eyes open, head turns side/side, up/down x5 sets each with min A for safety and cues for upper trunk control and weight shift for better static stance control; Patient required min VCs for balance stability, including to increase trunk control for less loss of balance with smaller base of support   Instructed patient in dynamic balance exercise: Resisted walking, 12.5# forward/backward, side/side x4 way, x2 laps each direction; required min A for safety and cues to improve weight shift especially with eccentric control for better balance control.  4 square stepping, clockwise, counterclockwise x3 sets each with min A and mod Vcs for sequencing and foot placement; patient required cues to increase step length for better foot placement and clearance; 4 square multiple direction stepping with therapist calling out random numbers x2-3 min with min A for safety; He had increased difficulty changing directions having to think about which square to step into next due to cognitive challenge;  Ladder drills: Forward reciprocal gait x4 laps Forward step with contralateral leg lift (SLS) 3 sec hold x4 laps, min A for safety and cues to improve core stabilization for better stance control Side stepping x1 lap each direction; Required mod Vcs for sequencing and foot placement; He required cues to increase hip flexion for better foot clearance and to increase step length to avoid stepping on rungs. Patient had increased difficulty with SLS challenge especially while trying to count out loud, often getting confused with dual task challenge;  Sit<>stand from regular chair with yellow weighted ball  overhead press x10 reps, supervision for safety;   Response to treatment: Patient very hard of hearing requiring increased instruction and demonstration for better activity technique; He was able to negotiate obstacles with less rail assist but does require cues to increase step length for better foot clearance and to improve weight shift for less heavy hitting on step down for better dynamic control. Patient does fatigue with prolonged standing requiring short seated rest break; He also was challenged with dual task activities requiring increased instruction for proper exercise technique;                         PT Education - 02/07/19 1500    Education Details  exercise technique/balance, HEP reinforced;    Person(s) Educated  Patient    Methods  Explanation;Verbal  cues    Comprehension  Verbalized understanding;Returned demonstration;Verbal cues required;Need further instruction       PT Short Term Goals - 01/25/19 1036      PT SHORT TERM GOAL #1   Title  Patient will be adherent to HEP at least 3x a week to improve functional strength and balance for better safety at home.    Time  4    Period  Weeks    Status  New    Target Date  02/22/19      PT SHORT TERM GOAL #2   Title  Patient will deny any falls over past 2 weeks to demonstrate improved safety awareness at home and work.    Time  4    Period  Weeks    Status  New    Target Date  02/22/19        PT Long Term Goals - 01/25/19 1037      PT LONG TERM GOAL #1   Title  Patient will increase BLE gross strength to 4+/5 as to improve functional strength for independent gait, increased standing tolerance and increased ADL ability.    Time  8    Period  Weeks    Status  New    Target Date  03/22/19      PT LONG TERM GOAL #2   Title  Patient will score >20 on mini best test to indicate low fall risk and exhibit improved general mobility with ADLs.    Time  8    Period  Weeks    Status  New     Target Date  03/22/19      PT LONG TERM GOAL #3   Title  Patient will be independent in getting up from floor with minimal difficulty to improve fall recovery at home.    Time  8    Period  Weeks    Status  New    Target Date  03/22/19      PT LONG TERM GOAL #4   Title  Patient will increase 10 meter walk test to >1.86m/s as to improve gait speed for better community ambulation and to reduce fall risk.    Time  8    Period  Weeks    Status  New    Target Date  03/22/19            Plan - 02/07/19 1501    Clinical Impression Statement  Patient motivated and participated well within session. instructed patient in advanced dynamic balance tasks. He had increased difficulty with dual task challenge such as stepping in ladder with SLS while counting with decreased stance control and decreased coordination; Patient required increased cues to increase step length and to improve ankle DF at heel strike for better foot clearance. He exhibited increased shuffle steps today. Unsure if this is related to fatigue or overall imbalance from busy environment. Patient would benefit from additional skilled PT Intervention to improve strength, balance and gait safety;    Personal Factors and Comorbidities  Age;Comorbidity 3+;Past/Current Experience;Time since onset of injury/illness/exacerbation    Comorbidities  hard of hearing, decreased memory/cognition, high fall risk with recent falls    Examination-Activity Limitations  Caring for Others;Carry;Continence;Lift;Locomotion Level;Stairs;Stand    Examination-Participation Restrictions  Church;Cleaning;Community Activity;Driving;Shop;Yard Work    Stability/Clinical Decision Making  Evolving/Moderate complexity    Rehab Potential  Fair    PT Frequency  2x / week    PT Duration  8 weeks    PT  Treatment/Interventions  Cryotherapy;Moist Heat;Gait training;Stair training;Functional mobility training;Therapeutic activities;Therapeutic exercise;Balance  training;Neuromuscular re-education;Patient/family education;Orthotic Fit/Training;Energy conservation    PT Next Visit Plan  foot clearance, strengthening    PT Home Exercise Plan  will address next visit    Consulted and Agree with Plan of Care  Patient       Patient will benefit from skilled therapeutic intervention in order to improve the following deficits and impairments:  Abnormal gait, Decreased activity tolerance, Decreased balance, Decreased endurance, Decreased knowledge of precautions, Decreased mobility, Decreased safety awareness, Decreased strength, Difficulty walking, Postural dysfunction  Visit Diagnosis: 1. Other abnormalities of gait and mobility   2. Muscle weakness (generalized)   3. Unsteadiness on feet   4. Abnormal posture        Problem List Patient Active Problem List   Diagnosis Date Noted  . AMS (altered mental status) 01/18/2019  . Altered mental status 01/17/2019  . Incarcerated inguinal hernia 07/12/2018  . Right inguinal hernia 11/25/2016  . Empyema lung ([place])   . Pleural effusion on right   . Pleural effusion, bacterial   . Empyema ([place]) 06/17/2016  . Impingement syndrome of shoulder region 02/25/2016  . Shoulder pain 02/25/2016  . Subacromial impingement 02/25/2016  . Mixed hyperlipidemia 04/01/2015  . Murmur, cardiac 04/01/2015    Shihab States PT, DPT 02/07/2019, 3:03 PM  [place] MAIN [facility] SERVICES [address] [place], [place], [address] Phone: [phone]   Fax:  [phone]  Name: Jeffery [name] MRN: [id number] Date of Birth: [birth year]

## 2019-02-09 ENCOUNTER — Ambulatory Visit: Payer: Medicare Other

## 2019-02-12 ENCOUNTER — Ambulatory Visit: Payer: Medicare Other | Admitting: Physical Therapy

## 2019-02-14 ENCOUNTER — Ambulatory Visit: Payer: Medicare Other

## 2019-02-14 DIAGNOSIS — M6281 Muscle weakness (generalized): Secondary | ICD-10-CM

## 2019-02-14 DIAGNOSIS — R293 Abnormal posture: Secondary | ICD-10-CM

## 2019-02-14 DIAGNOSIS — R2689 Other abnormalities of gait and mobility: Secondary | ICD-10-CM

## 2019-02-14 DIAGNOSIS — R2681 Unsteadiness on feet: Secondary | ICD-10-CM

## 2019-02-14 NOTE — Therapy (Signed)
[place] MAIN [facility] SERVICES [address] [place], [place], [address] Phone: [phone]   Fax:  [phone]  Physical Therapy Treatment  Patient Details  Name: Jeffery [name] MRN: [id number] Date of Birth: [birth year] Referring Provider (PT): Dr. Benita Stabile PCP   Encounter Date: 02/14/2019  PT End of Session - 02/14/19 0954    Visit Number  6    Number of Visits  17    Date for PT Re-Evaluation  03/22/19    Authorization Type  start of care 01/23/19    PT Start Time  0915    PT Stop Time  1002    PT Time Calculation (min)  47 min    Equipment Utilized During Treatment  Gait belt    Activity Tolerance  Patient tolerated treatment well;No increased pain    Behavior During Therapy  WFL for tasks assessed/performed       Past Medical History:  Diagnosis Date  . Does use hearing aid   . Empyema lung ([place]) 06/03/2016   MODERATE WBC PRESENT, PREDOMINANTLY PMN, STREP INTERMEDIUS  . Hyperlipidemia   . Pneumonia Nov/Dec 2017    Past Surgical History:  Procedure Laterality Date  . APPENDECTOMY  1971  . COLONOSCOPY  2014  . EYE SURGERY     cataract surgery   . GREEN LIGHT LASER TURP (TRANSURETHRAL RESECTION OF PROSTATE N/A 06/20/2018   Procedure: GREEN LIGHT LASER TURP (TRANSURETHRAL RESECTION OF PROSTATE;  Surgeon: Royston Cowper, MD;  Location: [facility] ORS;  Service: Urology;  Laterality: N/A;  . HERNIA REPAIR Left 2008  . INGUINAL HERNIA REPAIR Right 12/01/2016   Right inguinal hernia repair with medium Ultra Pro mesh.  . INGUINAL HERNIA REPAIR Left 07/12/2018   Procedure: HERNIA REPAIR INGUINAL ADULT;  Surgeon: Jules Husbands, MD;  Location: [facility] ORS;  Service: General;  Laterality: Left;  . INSERTION OF MESH Right 12/01/2016   Procedure: INSERTION OF MESH;  Surgeon: Robert Bellow, MD;  Location: [facility] ORS;  Service: General;  Laterality: Right;  . TONSILLECTOMY  1944    There were no vitals filed for this visit.  Subjective  Assessment - 02/14/19 0953    Subjective  Patient reports no falls since last session. Has been compliant with HEP. No falls    Pertinent History  85 yo Male s/p prostate surgery in Dec 2019 and had subsequent PT in Jan 2020. PT did not help but was suggested that patient receive outpatient PT for weakness and imbalance. He was scheduled to start PT in April 2020 but then was unable to start due to coronavirus. Patient is now wanting to start PT. He has been having trouble with his memory and was recently started on Aricept. However patient started getting confused, wife suspected UTI, patient went to hospital and was diagnosed with abnormal labs and a-fib. They are waiting to hear from cardiologist and neurologist regarding medication and when to resume aricept. He has had 2 falls in last week; He does take frequent naps and will sleep well at night; He does continue to have urinary leakage due to prostate surgery; In addition, patient is very hard of hearing; He denies any dizziness; He denies any numbness/tingling in feet; He uses a SPC intermittently; he is not consistent with using it due to poor memory; He does enjoy walking the dog multiple times a day but only goes a short distance; He does report having to be careful to not let the dog pull him over  or trip him;    Limitations  Standing;Walking    How long can you sit comfortably?  NA    How long can you stand comfortably?  10-15 min;    How long can you walk comfortably?  1-2 blocks    Diagnostic tests  MRI- no acute abnormality, does show chronic ischemic disease    Patient Stated Goals  "Improve walking ability, reduce fall risk"    Currently in Pain?  No/denies        Treatment:    Nustep Lvl 4 RPM>60 for cardiovascular support and muscle fatigue   TherEx: cueing for body mechanics and sequencing. Tactile and visual cueing required in addition to verbal cueing due to limited hearing.  Ankle weights 3lb  Seated marches 20x   Seated  LAQ 15x each LE GTB hamstring curl 15x each LE  Neuro Re-ed: CGA required for all tasks due to limited stability   Walking with horizontal head turns reading cards wx 86 ft; cueing for turns to R and L, one near LOB  walking with ball toss 2x 86 ft; verbal cueing for decreasing rate of toss due to increased velocity with increased time with more frequent near LOB requiring CGA at all times.   speed ladder: one foot in each square for longer step length x 4 trials; CGA patient initially challenged with maintaining widened step length but improved with repetition.   Speed ladder: lateral side step 2x each side; more challenging to R, improved coordination to the L.   Speed ladder: two feet in/out with max cueing. Very challenging for patient to perform.     Pt educated throughout session about proper posture and technique with exercises. Improved exercise technique, movement at target joints, use of target muscles after min to mod verbal, visual, tactile cues.                PT Education - 02/14/19 0953    Education Details  exercise technique/ balance    Person(s) Educated  Patient    Methods  Explanation;Demonstration;Tactile cues;Verbal cues    Comprehension  Verbalized understanding;Returned demonstration;Verbal cues required;Tactile cues required;Need further instruction       PT Short Term Goals - 01/25/19 1036      PT SHORT TERM GOAL #1   Title  Patient will be adherent to HEP at least 3x a week to improve functional strength and balance for better safety at home.    Time  4    Period  Weeks    Status  New    Target Date  02/22/19      PT SHORT TERM GOAL #2   Title  Patient will deny any falls over past 2 weeks to demonstrate improved safety awareness at home and work.    Time  4    Period  Weeks    Status  New    Target Date  02/22/19        PT Long Term Goals - 01/25/19 1037      PT LONG TERM GOAL #1   Title  Patient will increase BLE gross  strength to 4+/5 as to improve functional strength for independent gait, increased standing tolerance and increased ADL ability.    Time  8    Period  Weeks    Status  New    Target Date  03/22/19      PT LONG TERM GOAL #2   Title  Patient will score >20 on mini best test to indicate  low fall risk and exhibit improved general mobility with ADLs.    Time  8    Period  Weeks    Status  New    Target Date  03/22/19      PT LONG TERM GOAL #3   Title  Patient will be independent in getting up from floor with minimal difficulty to improve fall recovery at home.    Time  8    Period  Weeks    Status  New    Target Date  03/22/19      PT LONG TERM GOAL #4   Title  Patient will increase 10 meter walk test to >1.44m/s as to improve gait speed for better community ambulation and to reduce fall risk.    Time  8    Period  Weeks    Status  New    Target Date  03/22/19            Plan - 02/14/19 1035    Clinical Impression Statement  Patient presents with good motivation to today's session. Is challenged by multidirectional tasks/dual tasking requiring increased cueing and contact guard. Patient is additionally challenged with single limb stance with increased shuffle/limited foot clearance bilaterally with fatigue. Patient requires intermittent seated rest breaks due to fatigue. Patient will benefit from additional skilled PT intervention to improve strength, balance, and gait safety    Personal Factors and Comorbidities  Age;Comorbidity 3+;Past/Current Experience;Time since onset of injury/illness/exacerbation    Comorbidities  hard of hearing, decreased memory/cognition, high fall risk with recent falls    Examination-Activity Limitations  Caring for Others;Carry;Continence;Lift;Locomotion Level;Stairs;Stand    Examination-Participation Restrictions  Church;Cleaning;Community Activity;Driving;Shop;Yard Work    Stability/Clinical Decision Making  Evolving/Moderate complexity    Rehab  Potential  Fair    PT Frequency  2x / week    PT Duration  8 weeks    PT Treatment/Interventions  Cryotherapy;Moist Heat;Gait training;Stair training;Functional mobility training;Therapeutic activities;Therapeutic exercise;Balance training;Neuromuscular re-education;Patient/family education;Orthotic Fit/Training;Energy conservation    PT Next Visit Plan  foot clearance, strengthening    PT Home Exercise Plan  will address next visit    Consulted and Agree with Plan of Care  Patient       Patient will benefit from skilled therapeutic intervention in order to improve the following deficits and impairments:  Abnormal gait, Decreased activity tolerance, Decreased balance, Decreased endurance, Decreased knowledge of precautions, Decreased mobility, Decreased safety awareness, Decreased strength, Difficulty walking, Postural dysfunction  Visit Diagnosis: 1. Other abnormalities of gait and mobility   2. Muscle weakness (generalized)   3. Unsteadiness on feet   4. Abnormal posture        Problem List Patient Active Problem List   Diagnosis Date Noted  . AMS (altered mental status) 01/18/2019  . Altered mental status 01/17/2019  . Incarcerated inguinal hernia 07/12/2018  . Right inguinal hernia 11/25/2016  . Empyema lung ([place])   . Pleural effusion on right   . Pleural effusion, bacterial   . Empyema ([place]) 06/17/2016  . Impingement syndrome of shoulder region 02/25/2016  . Shoulder pain 02/25/2016  . Subacromial impingement 02/25/2016  . Mixed hyperlipidemia 04/01/2015  . Murmur, cardiac 04/01/2015   Janna Arch, PT, DPT   02/14/2019, 10:37 AM  [place] MAIN [facility] SERVICES [address] [place], [place], [address] Phone: [phone]   Fax:  [phone]  Name: JOSMAR MESSIMER MRN: [id number] Date of Birth: [birth year]

## 2019-02-16 ENCOUNTER — Other Ambulatory Visit: Payer: Self-pay

## 2019-02-16 ENCOUNTER — Ambulatory Visit: Payer: Medicare Other

## 2019-02-16 DIAGNOSIS — R2681 Unsteadiness on feet: Secondary | ICD-10-CM

## 2019-02-16 DIAGNOSIS — R293 Abnormal posture: Secondary | ICD-10-CM

## 2019-02-16 DIAGNOSIS — R2689 Other abnormalities of gait and mobility: Secondary | ICD-10-CM | POA: Diagnosis not present

## 2019-02-16 DIAGNOSIS — M6281 Muscle weakness (generalized): Secondary | ICD-10-CM

## 2019-02-16 NOTE — Therapy (Signed)
[place] MAIN [facility] SERVICES [address] [place], [place], [address] Phone: [phone]   Fax:  [phone]  Physical Therapy Treatment  Patient Details  Name: Jeffery [name] MRN: [id number] Date of Birth: 31-Jul-1930 Referring Provider (PT): Dr. Benita Stabile PCP   Encounter Date: 02/16/2019  PT End of Session - 02/16/19 1049    Visit Number  7    Number of Visits  17    Date for PT Re-Evaluation  03/22/19    Authorization Type  start of care 01/23/19    PT Start Time  4097    PT Stop Time  1129    PT Time Calculation (min)  44 min    Equipment Utilized During Treatment  Gait belt    Activity Tolerance  Patient tolerated treatment well;No increased pain    Behavior During Therapy  WFL for tasks assessed/performed       Past Medical History:  Diagnosis Date  . Does use hearing aid   . Empyema lung ([place]) 06/03/2016   MODERATE WBC PRESENT, PREDOMINANTLY PMN, STREP INTERMEDIUS  . Hyperlipidemia   . Pneumonia Nov/Dec 2017    Past Surgical History:  Procedure Laterality Date  . APPENDECTOMY  1971  . COLONOSCOPY  2014  . EYE SURGERY     cataract surgery   . GREEN LIGHT LASER TURP (TRANSURETHRAL RESECTION OF PROSTATE N/A 06/20/2018   Procedure: GREEN LIGHT LASER TURP (TRANSURETHRAL RESECTION OF PROSTATE;  Surgeon: Royston Cowper, MD;  Location: [facility] ORS;  Service: Urology;  Laterality: N/A;  . HERNIA REPAIR Left 2008  . INGUINAL HERNIA REPAIR Right 12/01/2016   Right inguinal hernia repair with medium Ultra Pro mesh.  . INGUINAL HERNIA REPAIR Left 07/12/2018   Procedure: HERNIA REPAIR INGUINAL ADULT;  Surgeon: Jules Husbands, MD;  Location: [facility] ORS;  Service: General;  Laterality: Left;  . INSERTION OF MESH Right 12/01/2016   Procedure: INSERTION OF MESH;  Surgeon: Robert Bellow, MD;  Location: [facility] ORS;  Service: General;  Laterality: Right;  . TONSILLECTOMY  1944    There were no vitals filed for this visit.  Subjective  Assessment - 02/16/19 1049    Subjective  Patient reports he did a long walk prior to coming to PT walking his dog, No falls since last session. Compliant with HEP. No pain.    Pertinent History  85 yo Male s/p prostate surgery in Dec 2019 and had subsequent PT in Jan 2020. PT did not help but was suggested that patient receive outpatient PT for weakness and imbalance. He was scheduled to start PT in April 2020 but then was unable to start due to coronavirus. Patient is now wanting to start PT. He has been having trouble with his memory and was recently started on Aricept. However patient started getting confused, wife suspected UTI, patient went to hospital and was diagnosed with abnormal labs and a-fib. They are waiting to hear from cardiologist and neurologist regarding medication and when to resume aricept. He has had 2 falls in last week; He does take frequent naps and will sleep well at night; He does continue to have urinary leakage due to prostate surgery; In addition, patient is very hard of hearing; He denies any dizziness; He denies any numbness/tingling in feet; He uses a SPC intermittently; he is not consistent with using it due to poor memory; He does enjoy walking the dog multiple times a day but only goes a short distance; He does report having  to be careful to not let the dog pull him over or trip him;    Limitations  Standing;Walking    How long can you sit comfortably?  NA    How long can you stand comfortably?  10-15 min;    How long can you walk comfortably?  1-2 blocks    Diagnostic tests  MRI- no acute abnormality, does show chronic ischemic disease    Patient Stated Goals  "Improve walking ability, reduce fall risk"    Currently in Pain?  No/denies          Treatment:    Nustep Lvl 4 RPM>60 for cardiovascular support and muscle fatigue     TherEx: cueing for body mechanics and sequencing. Tactile and visual cueing required in addition to verbal cueing due to limited  hearing.  Ankle weights 3lb             Hip extension 12x each LE, tactile cueing to upper chest for upright position (mod cueing), max verbal and visual cueing for sequencing of extension rather than flexion.   Hip flexion marches BUE support 15x each LE             Seated LAQ 15x each LE  RTB  Around ankles: Monster walks/[place] walks: cueing for squatting down, maintaining wide BOS and large step length with CGA and BUE support. Max visual and verbal cueing. 6x length of // bars   Standing heel toe raises 20x with BUE support  Neuro Re-ed: CGA required for all tasks due to limited stability    airex pad: one foot on 6" step , weighted ball chest press 10x each LE, 2 sets. Cueing for sequencing, visual cue to press to PT hand  airex pad:  Basketball toss, tossing balls into basketball hoop with L and R hand to cause pertubation without UE assistance, one near LOB, CGA x4 minutes  walking with ball toss 1x 86 ft; verbal cueing for decreasing rate of toss due to increased velocity with increased time with more frequent near LOB requiring CGA at all times.   Walking in hallway with ball bounce 1x73ft, occasional near LOB,   Ambulate around gym with visual scanning horizontally and vertically for cones, squatting and reaching up/down for cones placed around gym. (x7 cones)   Cone tap: cones placed in front and laterally to patient, progressively decreasing UE support to two fingers patient tapped top of cone, initially knocking over cones patient was able to successfully perform x 8 each direction each LE    Pt educated throughout session about proper posture and technique with exercises. Improved exercise technique, movement at target joints, use of target muscles after min to mod verbal, visual, tactile cues.                       PT Education - 02/16/19 1049    Education Details  exercise technique, stability    Person(s) Educated  Patient    Methods   Explanation;Demonstration;Tactile cues;Verbal cues    Comprehension  Verbalized understanding;Returned demonstration;Verbal cues required;Tactile cues required       PT Short Term Goals - 01/25/19 1036      PT SHORT TERM GOAL #1   Title  Patient will be adherent to HEP at least 3x a week to improve functional strength and balance for better safety at home.    Time  4    Period  Weeks    Status  New  Target Date  02/22/19      PT SHORT TERM GOAL #2   Title  Patient will deny any falls over past 2 weeks to demonstrate improved safety awareness at home and work.    Time  4    Period  Weeks    Status  New    Target Date  02/22/19        PT Long Term Goals - 01/25/19 1037      PT LONG TERM GOAL #1   Title  Patient will increase BLE gross strength to 4+/5 as to improve functional strength for independent gait, increased standing tolerance and increased ADL ability.    Time  8    Period  Weeks    Status  New    Target Date  03/22/19      PT LONG TERM GOAL #2   Title  Patient will score >20 on mini best test to indicate low fall risk and exhibit improved general mobility with ADLs.    Time  8    Period  Weeks    Status  New    Target Date  03/22/19      PT LONG TERM GOAL #3   Title  Patient will be independent in getting up from floor with minimal difficulty to improve fall recovery at home.    Time  8    Period  Weeks    Status  New    Target Date  03/22/19      PT LONG TERM GOAL #4   Title  Patient will increase 10 meter walk test to >1.17m/s as to improve gait speed for better community ambulation and to reduce fall risk.    Time  8    Period  Weeks    Status  New    Target Date  03/22/19            Plan - 02/16/19 1204    Clinical Impression Statement  Patient presents to physical therapy with slight fatigue due to walking his dog prior to his session. Patient performs interventions better with use of demonstration prior to and during intervention due to  limited hearing. Due to patient's competitive spirit use of "games" to drive balance interventions was utilized with patient demonstrating good spirit and participation. Patient will benefit from additional skilled PT intervention to improve strength, balance, and gait safety    Personal Factors and Comorbidities  Age;Comorbidity 3+;Past/Current Experience;Time since onset of injury/illness/exacerbation    Comorbidities  hard of hearing, decreased memory/cognition, high fall risk with recent falls    Examination-Activity Limitations  Caring for Others;Carry;Continence;Lift;Locomotion Level;Stairs;Stand    Examination-Participation Restrictions  Church;Cleaning;Community Activity;Driving;Shop;Yard Work    Stability/Clinical Decision Making  Evolving/Moderate complexity    Rehab Potential  Fair    PT Frequency  2x / week    PT Duration  8 weeks    PT Treatment/Interventions  Cryotherapy;Moist Heat;Gait training;Stair training;Functional mobility training;Therapeutic activities;Therapeutic exercise;Balance training;Neuromuscular re-education;Patient/family education;Orthotic Fit/Training;Energy conservation    PT Next Visit Plan  foot clearance, strengthening    PT Home Exercise Plan  will address next visit    Consulted and Agree with Plan of Care  Patient       Patient will benefit from skilled therapeutic intervention in order to improve the following deficits and impairments:  Abnormal gait, Decreased activity tolerance, Decreased balance, Decreased endurance, Decreased knowledge of precautions, Decreased mobility, Decreased safety awareness, Decreased strength, Difficulty walking, Postural dysfunction  Visit Diagnosis: 1. Other abnormalities of gait  and mobility   2. Muscle weakness (generalized)   3. Unsteadiness on feet   4. Abnormal posture        Problem List Patient Active Problem List   Diagnosis Date Noted  . AMS (altered mental status) 01/18/2019  . Altered mental status  01/17/2019  . Incarcerated inguinal hernia 07/12/2018  . Right inguinal hernia 11/25/2016  . Empyema lung ([place])   . Pleural effusion on right   . Pleural effusion, bacterial   . Empyema ([place]) 06/17/2016  . Impingement syndrome of shoulder region 02/25/2016  . Shoulder pain 02/25/2016  . Subacromial impingement 02/25/2016  . Mixed hyperlipidemia 04/01/2015  . Murmur, cardiac 04/01/2015   Janna Arch, PT, DPT   02/16/2019, 12:06 PM  [place] MAIN [facility] SERVICES [address] [place], [place], [address] Phone: [phone]   Fax:  [phone]  Name: Jeffery [name] MRN: [id number] Date of Birth: June 10, 1931

## 2019-02-19 ENCOUNTER — Encounter: Payer: Self-pay | Admitting: Physical Therapy

## 2019-02-19 ENCOUNTER — Other Ambulatory Visit: Payer: Self-pay

## 2019-02-19 ENCOUNTER — Ambulatory Visit: Payer: Medicare Other | Attending: Internal Medicine | Admitting: Physical Therapy

## 2019-02-19 DIAGNOSIS — R293 Abnormal posture: Secondary | ICD-10-CM | POA: Diagnosis present

## 2019-02-19 DIAGNOSIS — M6281 Muscle weakness (generalized): Secondary | ICD-10-CM | POA: Insufficient documentation

## 2019-02-19 DIAGNOSIS — R2689 Other abnormalities of gait and mobility: Secondary | ICD-10-CM | POA: Diagnosis present

## 2019-02-19 DIAGNOSIS — R2681 Unsteadiness on feet: Secondary | ICD-10-CM | POA: Diagnosis present

## 2019-02-19 NOTE — Therapy (Signed)
[place] MAIN [facility] SERVICES [address] [place], [place], [address] Phone: [phone]   Fax:  [phone]  Physical Therapy Treatment  Patient Details  Name: Jeffery [name] MRN: [id number] Date of Birth: Apr 14, 1931 Referring Provider (PT): Dr. Benita Stabile PCP   Encounter Date: 02/19/2019  PT End of Session - 02/19/19 1056    Visit Number  8    Number of Visits  17    Date for PT Re-Evaluation  03/22/19    Authorization Type  start of care 01/23/19    PT Start Time  1045    PT Stop Time  1142    PT Time Calculation (min)  57 min    Equipment Utilized During Treatment  Gait belt    Activity Tolerance  Patient tolerated treatment well;No increased pain    Behavior During Therapy  WFL for tasks assessed/performed       Past Medical History:  Diagnosis Date  . Does use hearing aid   . Empyema lung ([place]) 06/03/2016   MODERATE WBC PRESENT, PREDOMINANTLY PMN, STREP INTERMEDIUS  . Hyperlipidemia   . Pneumonia Nov/Dec 2017    Past Surgical History:  Procedure Laterality Date  . APPENDECTOMY  1971  . COLONOSCOPY  2014  . EYE SURGERY     cataract surgery   . GREEN LIGHT LASER TURP (TRANSURETHRAL RESECTION OF PROSTATE N/A 06/20/2018   Procedure: GREEN LIGHT LASER TURP (TRANSURETHRAL RESECTION OF PROSTATE;  Surgeon: Royston Cowper, MD;  Location: [facility] ORS;  Service: Urology;  Laterality: N/A;  . HERNIA REPAIR Left 2008  . INGUINAL HERNIA REPAIR Right 12/01/2016   Right inguinal hernia repair with medium Ultra Pro mesh.  . INGUINAL HERNIA REPAIR Left 07/12/2018   Procedure: HERNIA REPAIR INGUINAL ADULT;  Surgeon: Jules Husbands, MD;  Location: [facility] ORS;  Service: General;  Laterality: Left;  . INSERTION OF MESH Right 12/01/2016   Procedure: INSERTION OF MESH;  Surgeon: Robert Bellow, MD;  Location: [facility] ORS;  Service: General;  Laterality: Right;  . TONSILLECTOMY  1944    There were no vitals filed for this visit.  Subjective  Assessment - 02/19/19 1055    Subjective  Patient reports doing well; Denies any pain or soreness; Denies any new falls. His wife reports that he does seem to be walking a little better and dragging his feet less.    Pertinent History  85 yo Male s/p prostate surgery in Dec 2019 and had subsequent PT in Jan 2020. PT did not help but was suggested that patient receive outpatient PT for weakness and imbalance. He was scheduled to start PT in April 2020 but then was unable to start due to coronavirus. Patient is now wanting to start PT. He has been having trouble with his memory and was recently started on Aricept. However patient started getting confused, wife suspected UTI, patient went to hospital and was diagnosed with abnormal labs and a-fib. They are waiting to hear from cardiologist and neurologist regarding medication and when to resume aricept. He has had 2 falls in last week; He does take frequent naps and will sleep well at night; He does continue to have urinary leakage due to prostate surgery; In addition, patient is very hard of hearing; He denies any dizziness; He denies any numbness/tingling in feet; He uses a SPC intermittently; he is not consistent with using it due to poor memory; He does enjoy walking the dog multiple times a day but only goes a  short distance; He does report having to be careful to not let the dog pull him over or trip him;    Limitations  Standing;Walking    How long can you sit comfortably?  NA    How long can you stand comfortably?  10-15 min;    How long can you walk comfortably?  1-2 blocks    Diagnostic tests  MRI- no acute abnormality, does show chronic ischemic disease    Patient Stated Goals  "Improve walking ability, reduce fall risk"    Currently in Pain?  No/denies    Multiple Pain Sites  No         TREATMENT: Warm up on Nustep BUE/BLE level 2 x4 min (Unbilled);  Patient instructed in advanced balance exercise  Standing in parallel  bars:   Standing on airex foam: -alternate heel strike to 4 inch step with 2-1-0 rail assist x15 reps bilaterally; -Standing one foot on airex, one foot on 4 inch step, BUE ball side/side trunk rotation, up/down ball raises x5 reps each foot on step; Patient required min VCs for balance stability, including to increase trunk control for less loss of balance with smaller base of support  Standing on 1/2 foam: (Flat side up) -heel/toe rocks with feet apart heel/toe rocks x15 with rail assist for safety -feet apart, BUE wand flexion x10 reps with min-mod A for safety and cues to improve ankle strategies for better stance control, he exhibits heavy forward lean requiring increased instruction for better erect posture;  -tandem stance with 2-0 rail assist 10 sec hold x2 reps each foot in front with CGA to min A for safety and cues to improve erect posture and increase weight shift for better stance control   Weaving around cones #6 x4 laps with min A for safety; required min VCs to increase step length and improve ankle DF. With cues for better heel strike, patient became confused having difficult time weaving around cones requiring re-instruction to task. He had significant difficulty with dual task instruction;   Unsupported on level surface, forward lunge with arms overhead for dual task/coordination challenge x5 reps each foot in front with  Max VCs, demonstration and tactile cues for proper technique; patient has difficulty with dual task often lunging without moving arms or lifting arms without taking a step; He denies any pain with advanced exercise but does fatigue quickly; Patient exhibits more unsteadiness when reaching higher overhead with decreased dynamic balance control;   Instructed patient in dynamic balance exercise: Resisted walking, 12.5# forward/backward, side/side x4 way, x2 laps each direction, stepping over orange hurdle; patient  required min A for safety and cues to improve  weight shift especially with eccentric control for better balance control.  Response to treatment: Patient very hard of hearing requiring increased instruction and demonstration for better activity technique; He was able to negotiate obstacles with less rail assist but does require cues to increase step length for better foot clearance and to improve weight shift for less heavy hitting on step down for better dynamic control. Patient does fatigue with prolonged standing requiring short seated rest break;He also was challenged with dual task activities requiring increased instruction for proper exercise technique;               PT Education - 02/19/19 1056    Education Details  exercise technique/balance/gait safety;    Person(s) Educated  Patient    Methods  Explanation;Verbal cues    Comprehension  Verbalized understanding;Returned demonstration;Verbal cues required;Need further  instruction       PT Short Term Goals - 01/25/19 1036      PT SHORT TERM GOAL #1   Title  Patient will be adherent to HEP at least 3x a week to improve functional strength and balance for better safety at home.    Time  4    Period  Weeks    Status  New    Target Date  02/22/19      PT SHORT TERM GOAL #2   Title  Patient will deny any falls over past 2 weeks to demonstrate improved safety awareness at home and work.    Time  4    Period  Weeks    Status  New    Target Date  02/22/19        PT Long Term Goals - 01/25/19 1037      PT LONG TERM GOAL #1   Title  Patient will increase BLE gross strength to 4+/5 as to improve functional strength for independent gait, increased standing tolerance and increased ADL ability.    Time  8    Period  Weeks    Status  New    Target Date  03/22/19      PT LONG TERM GOAL #2   Title  Patient will score >20 on mini best test to indicate low fall risk and exhibit improved general mobility with ADLs.    Time  8    Period  Weeks    Status  New     Target Date  03/22/19      PT LONG TERM GOAL #3   Title  Patient will be independent in getting up from floor with minimal difficulty to improve fall recovery at home.    Time  8    Period  Weeks    Status  New    Target Date  03/22/19      PT LONG TERM GOAL #4   Title  Patient will increase 10 meter walk test to >1.106m/s as to improve gait speed for better community ambulation and to reduce fall risk.    Time  8    Period  Weeks    Status  New    Target Date  03/22/19            Plan - 02/19/19 1234    Clinical Impression Statement  Patient motivated and participated well within session; He did have increased difficulty with dual tasks requiring increased cues and instruction for proper exercise technique. patient exhibits increased shuffled steps with increased confusion. He is very hard of hearing which limits his understanding with verbal cues often requiring demonstration and tactile cues. Patient would benefit from additional skilled PT intervention to improve strength, balance and gait safety;    Personal Factors and Comorbidities  Age;Comorbidity 3+;Past/Current Experience;Time since onset of injury/illness/exacerbation    Comorbidities  hard of hearing, decreased memory/cognition, high fall risk with recent falls    Examination-Activity Limitations  Caring for Others;Carry;Continence;Lift;Locomotion Level;Stairs;Stand    Examination-Participation Restrictions  Church;Cleaning;Community Activity;Driving;Shop;Yard Work    Stability/Clinical Decision Making  Evolving/Moderate complexity    Rehab Potential  Fair    PT Frequency  2x / week    PT Duration  8 weeks    PT Treatment/Interventions  Cryotherapy;Moist Heat;Gait training;Stair training;Functional mobility training;Therapeutic activities;Therapeutic exercise;Balance training;Neuromuscular re-education;Patient/family education;Orthotic Fit/Training;Energy conservation    PT Next Visit Plan  foot clearance, strengthening     PT Home Exercise Plan  will address next  visit    Consulted and Agree with Plan of Care  Patient       Patient will benefit from skilled therapeutic intervention in order to improve the following deficits and impairments:  Abnormal gait, Decreased activity tolerance, Decreased balance, Decreased endurance, Decreased knowledge of precautions, Decreased mobility, Decreased safety awareness, Decreased strength, Difficulty walking, Postural dysfunction  Visit Diagnosis: 1. Other abnormalities of gait and mobility   2. Muscle weakness (generalized)   3. Unsteadiness on feet   4. Abnormal posture        Problem List Patient Active Problem List   Diagnosis Date Noted  . AMS (altered mental status) 01/18/2019  . Altered mental status 01/17/2019  . Incarcerated inguinal hernia 07/12/2018  . Right inguinal hernia 11/25/2016  . Empyema lung ([place])   . Pleural effusion on right   . Pleural effusion, bacterial   . Empyema ([place]) 06/17/2016  . Impingement syndrome of shoulder region 02/25/2016  . Shoulder pain 02/25/2016  . Subacromial impingement 02/25/2016  . Mixed hyperlipidemia 04/01/2015  . Murmur, cardiac 04/01/2015    , PT, DPT 02/19/2019, 12:42 PM  [place] MAIN [facility] SERVICES [address] [place], [place], [address] Phone: [phone]   Fax:  [phone]  Name: Jeffery [name] MRN: [id number] Date of Birth: 11-Jul-1931

## 2019-02-21 ENCOUNTER — Encounter: Payer: Self-pay | Admitting: Physical Therapy

## 2019-02-21 ENCOUNTER — Ambulatory Visit: Payer: Medicare Other | Admitting: Physical Therapy

## 2019-02-21 ENCOUNTER — Other Ambulatory Visit: Payer: Self-pay

## 2019-02-21 DIAGNOSIS — R2689 Other abnormalities of gait and mobility: Secondary | ICD-10-CM

## 2019-02-21 DIAGNOSIS — R2681 Unsteadiness on feet: Secondary | ICD-10-CM

## 2019-02-21 DIAGNOSIS — M6281 Muscle weakness (generalized): Secondary | ICD-10-CM

## 2019-02-21 NOTE — Therapy (Signed)
[place] MAIN [facility] SERVICES [address] [place], [place], [address] Phone: [phone]   Fax:  [phone]  Physical Therapy Treatment  Patient Details  Name: Jeffery [name] MRN: [id number] Date of Birth: 09/27/1930 Referring Provider (PT): Dr. Benita Stabile PCP   Encounter Date: 02/21/2019  PT End of Session - 02/21/19 1156    Visit Number  9    Number of Visits  17    Date for PT Re-Evaluation  03/22/19    Authorization Type  start of care 01/23/19    PT Start Time  2505    PT Stop Time  1143    PT Time Calculation (min)  60 min    Equipment Utilized During Treatment  Gait belt    Activity Tolerance  Patient tolerated treatment well;No increased pain    Behavior During Therapy  WFL for tasks assessed/performed       Past Medical History:  Diagnosis Date  . Does use hearing aid   . Empyema lung ([place]) 06/03/2016   MODERATE WBC PRESENT, PREDOMINANTLY PMN, STREP INTERMEDIUS  . Hyperlipidemia   . Pneumonia Nov/Dec 2017    Past Surgical History:  Procedure Laterality Date  . APPENDECTOMY  1971  . COLONOSCOPY  2014  . EYE SURGERY     cataract surgery   . GREEN LIGHT LASER TURP (TRANSURETHRAL RESECTION OF PROSTATE N/A 06/20/2018   Procedure: GREEN LIGHT LASER TURP (TRANSURETHRAL RESECTION OF PROSTATE;  Surgeon: Royston Cowper, MD;  Location: [facility] ORS;  Service: Urology;  Laterality: N/A;  . HERNIA REPAIR Left 2008  . INGUINAL HERNIA REPAIR Right 12/01/2016   Right inguinal hernia repair with medium Ultra Pro mesh.  . INGUINAL HERNIA REPAIR Left 07/12/2018   Procedure: HERNIA REPAIR INGUINAL ADULT;  Surgeon: Jules Husbands, MD;  Location: [facility] ORS;  Service: General;  Laterality: Left;  . INSERTION OF MESH Right 12/01/2016   Procedure: INSERTION OF MESH;  Surgeon: Robert Bellow, MD;  Location: [facility] ORS;  Service: General;  Laterality: Right;  . TONSILLECTOMY  1944    There were no vitals filed for this visit.  Subjective  Assessment - 02/21/19 1155    Subjective  Patient reports doing well; Denies any new falls. Reports that he has been enjoying working in the garden with minimal difficulty; Denies any pain;    Pertinent History  85 yo Male s/p prostate surgery in Dec 2019 and had subsequent PT in Jan 2020. PT did not help but was suggested that patient receive outpatient PT for weakness and imbalance. He was scheduled to start PT in April 2020 but then was unable to start due to coronavirus. Patient is now wanting to start PT. He has been having trouble with his memory and was recently started on Aricept. However patient started getting confused, wife suspected UTI, patient went to hospital and was diagnosed with abnormal labs and a-fib. They are waiting to hear from cardiologist and neurologist regarding medication and when to resume aricept. He has had 2 falls in last week; He does take frequent naps and will sleep well at night; He does continue to have urinary leakage due to prostate surgery; In addition, patient is very hard of hearing; He denies any dizziness; He denies any numbness/tingling in feet; He uses a SPC intermittently; he is not consistent with using it due to poor memory; He does enjoy walking the dog multiple times a day but only goes a short distance; He does report having to  be careful to not let the dog pull him over or trip him;    Limitations  Standing;Walking    How long can you sit comfortably?  NA    How long can you stand comfortably?  10-15 min;    How long can you walk comfortably?  1-2 blocks    Diagnostic tests  MRI- no acute abnormality, does show chronic ischemic disease    Patient Stated Goals  "Improve walking ability, reduce fall risk"    Currently in Pain?  No/denies    Multiple Pain Sites  No               TREATMENT: Warm up on treadmill 1.5 mph with 2-1 HHA x4 min with cues to increase step length and increase heel strike for less foot drag; Patient had increased  difficulty with less HHA; required CGA for safety;   Educated patient and caregiver in floor recovery with getting down in floor on red mat and standing up using chair for safety x1 set; Patient required mod VCs for proper positioning to improve ease of transfer; Patient and caregiver educated in ways to improve successful floor transfer including to utilize pillow under knees, use chair/object to push up on and to try lumbar trunk rotation while lying supine if patient has been in the floor for a while to reduce stiffness;  Patient and caregiver verbalized understanding;    Gait outside in hallway: Forward walking with head turns side/side x80 feet x4 laps with calling out cards for cognitive challenge; Required cues for increasing step length/increasing foot clearance for better gait safety; Patient initially exhibits slow shuffled step due to difficulty with cognitive challenge but was able to exhibit improved speed/step length with increased repetition;  Forward/backward walking with ball toss and catch x80 feet x2 laps Patient required mod VCs to increase step length and improve gait speed especially with dual task such as toss/catch ball. Required cues to improve gaze stabilization with head turns to reduce dizziness and reduce veering side/side.  Instructed patient in dynamic balance exercise: Resisted walking, 7.5# side step x10 feet up/down 4 inch step unsupported x3 laps each direction with mod VCs for sequencing and positioning; Patient required increased time but was able to step up and over 4 inch step with good control;  Therapeutic exercise: Instructed patient in advanced LE strengthening: -hip abduction green tband x15 reps bilaterally; -hip extension green tband x15 reps bilaterally; -diagonal stepping green tband forward/backward x10 feet x2 laps each direction with mod Vcs for sequencing and positioning to improve strength and gait safety; Patient required min-moderate  verbal/tactile cues for correct exercise technique including cues to improve weight shift and slow down LE movement for better motor control;   Response to treatment: Patient very hard of hearing requiring increased instruction and demonstration for better activity technique; He was able to negotiate obstacles with less rail assist but does require cues to increase step length for better foot clearance and to improve weight shift for less heavy hitting on step down for better dynamic control.He also was challenged with dual task activities requiring increased instruction for proper exercise technique;                  PT Education - 02/21/19 1156    Education Details  exercise technique/balance/gait safety;    Person(s) Educated  Patient    Methods  Explanation;Verbal cues    Comprehension  Verbalized understanding;Returned demonstration;Verbal cues required;Need further instruction  PT Short Term Goals - 01/25/19 1036      PT SHORT TERM GOAL #1   Title  Patient will be adherent to HEP at least 3x a week to improve functional strength and balance for better safety at home.    Time  4    Period  Weeks    Status  New    Target Date  02/22/19      PT SHORT TERM GOAL #2   Title  Patient will deny any falls over past 2 weeks to demonstrate improved safety awareness at home and work.    Time  4    Period  Weeks    Status  New    Target Date  02/22/19        PT Long Term Goals - 01/25/19 1037      PT LONG TERM GOAL #1   Title  Patient will increase BLE gross strength to 4+/5 as to improve functional strength for independent gait, increased standing tolerance and increased ADL ability.    Time  8    Period  Weeks    Status  New    Target Date  03/22/19      PT LONG TERM GOAL #2   Title  Patient will score >20 on mini best test to indicate low fall risk and exhibit improved general mobility with ADLs.    Time  8    Period  Weeks    Status  New    Target  Date  03/22/19      PT LONG TERM GOAL #3   Title  Patient will be independent in getting up from floor with minimal difficulty to improve fall recovery at home.    Time  8    Period  Weeks    Status  New    Target Date  03/22/19      PT LONG TERM GOAL #4   Title  Patient will increase 10 meter walk test to >1.63m/s as to improve gait speed for better community ambulation and to reduce fall risk.    Time  8    Period  Weeks    Status  New    Target Date  03/22/19            Plan - 02/21/19 1156    Clinical Impression Statement  Patient motivated and participated well within session.Instructed patient in fall recovery, educating caregiver on tips to improve ease of movement. Patient and caregiver verbalized understanding. Patient educated in advanced dynamic balance tasks with walking with head turns. He had increased difficulty with cognitive tasks calling out cards. Patient exhibits decreased step length and decreased gait speed with dual tasks. Advanced LE strengthening utilizing green tband for increased resistance. patient would benefit from additional skilled PT intervention to improve strength, balance and gait safety;    Personal Factors and Comorbidities  Age;Comorbidity 3+;Past/Current Experience;Time since onset of injury/illness/exacerbation    Comorbidities  hard of hearing, decreased memory/cognition, high fall risk with recent falls    Examination-Activity Limitations  Caring for Others;Carry;Continence;Lift;Locomotion Level;Stairs;Stand    Examination-Participation Restrictions  Church;Cleaning;Community Activity;Driving;Shop;Yard Work    Stability/Clinical Decision Making  Evolving/Moderate complexity    Rehab Potential  Fair    PT Frequency  2x / week    PT Duration  8 weeks    PT Treatment/Interventions  Cryotherapy;Moist Heat;Gait training;Stair training;Functional mobility training;Therapeutic activities;Therapeutic exercise;Balance training;Neuromuscular  re-education;Patient/family education;Orthotic Fit/Training;Energy conservation    PT Next Visit Plan  foot clearance, strengthening  PT Home Exercise Plan  will address next visit    Consulted and Agree with Plan of Care  Patient       Patient will benefit from skilled therapeutic intervention in order to improve the following deficits and impairments:  Abnormal gait, Decreased activity tolerance, Decreased balance, Decreased endurance, Decreased knowledge of precautions, Decreased mobility, Decreased safety awareness, Decreased strength, Difficulty walking, Postural dysfunction  Visit Diagnosis: 1. Other abnormalities of gait and mobility   2. Muscle weakness (generalized)   3. Unsteadiness on feet        Problem List Patient Active Problem List   Diagnosis Date Noted  . AMS (altered mental status) 01/18/2019  . Altered mental status 01/17/2019  . Incarcerated inguinal hernia 07/12/2018  . Right inguinal hernia 11/25/2016  . Empyema lung ([place])   . Pleural effusion on right   . Pleural effusion, bacterial   . Empyema ([place]) 06/17/2016  . Impingement syndrome of shoulder region 02/25/2016  . Shoulder pain 02/25/2016  . Subacromial impingement 02/25/2016  . Mixed hyperlipidemia 04/01/2015  . Murmur, cardiac 04/01/2015    Clotee Schlicker PT, DPT 02/21/2019, 12:53 PM  [place] MAIN [facility] SERVICES [address] [place], [place], [address] Phone: [phone]   Fax:  [phone]  Name: BRYLEY KOVACEVIC MRN: [id number] Date of Birth: 1930-12-13

## 2019-02-26 ENCOUNTER — Ambulatory Visit: Payer: Medicare Other | Admitting: Physical Therapy

## 2019-02-28 ENCOUNTER — Ambulatory Visit: Payer: Medicare Other

## 2019-02-28 ENCOUNTER — Other Ambulatory Visit: Payer: Self-pay

## 2019-02-28 DIAGNOSIS — R2689 Other abnormalities of gait and mobility: Secondary | ICD-10-CM | POA: Diagnosis not present

## 2019-02-28 DIAGNOSIS — R293 Abnormal posture: Secondary | ICD-10-CM

## 2019-02-28 DIAGNOSIS — M6281 Muscle weakness (generalized): Secondary | ICD-10-CM

## 2019-02-28 DIAGNOSIS — R2681 Unsteadiness on feet: Secondary | ICD-10-CM

## 2019-02-28 NOTE — Therapy (Addendum)
[place] MAIN [facility] SERVICES [address] [place], [place], [address] Phone: [phone]   Fax:  [phone]  Physical Therapy Treatment/Progress Note Dates of reporting period  01/23/19   to   02/28/19 Patient Details  Name: Jeffery [name] MRN: [id number] Date of Birth: 1930/10/31 Referring Provider (PT): Dr. Benita Stabile PCP   Encounter Date: 02/28/2019  PT End of Session - 02/28/19 1243    Visit Number  10    Number of Visits  17    Date for PT Re-Evaluation  03/22/19    Authorization Type  start of care 01/23/19    PT Start Time  1102    PT Stop Time  1144    PT Time Calculation (min)  42 min    Activity Tolerance  Patient tolerated treatment well;No increased pain    Behavior During Therapy  WFL for tasks assessed/performed       Past Medical History:  Diagnosis Date  . Does use hearing aid   . Empyema lung ([place]) 06/03/2016   MODERATE WBC PRESENT, PREDOMINANTLY PMN, STREP INTERMEDIUS  . Hyperlipidemia   . Pneumonia Nov/Dec 2017    Past Surgical History:  Procedure Laterality Date  . APPENDECTOMY  1971  . COLONOSCOPY  2014  . EYE SURGERY     cataract surgery   . GREEN LIGHT LASER TURP (TRANSURETHRAL RESECTION OF PROSTATE N/A 06/20/2018   Procedure: GREEN LIGHT LASER TURP (TRANSURETHRAL RESECTION OF PROSTATE;  Surgeon: Royston Cowper, MD;  Location: [facility] ORS;  Service: Urology;  Laterality: N/A;  . HERNIA REPAIR Left 2008  . INGUINAL HERNIA REPAIR Right 12/01/2016   Right inguinal hernia repair with medium Ultra Pro mesh.  . INGUINAL HERNIA REPAIR Left 07/12/2018   Procedure: HERNIA REPAIR INGUINAL ADULT;  Surgeon: Jules Husbands, MD;  Location: [facility] ORS;  Service: General;  Laterality: Left;  . INSERTION OF MESH Right 12/01/2016   Procedure: INSERTION OF MESH;  Surgeon: Robert Bellow, MD;  Location: [facility] ORS;  Service: General;  Laterality: Right;  . TONSILLECTOMY  1944    There were no vitals filed for this  visit.  Subjective Assessment - 02/28/19 1107    Subjective  Pt/wife report things are going well in general. No major events recently. Pt reports  near fall while watering the garden flowers, otherwise no updates.    Pertinent History  85 yo Male s/p prostate surgery in Dec 2019 and had subsequent PT in Jan 2020. PT did not help but was suggested that patient receive outpatient PT for weakness and imbalance. He was scheduled to start PT in April 2020 but then was unable to start due to coronavirus. Patient is now wanting to start PT. He has been having trouble with his memory and was recently started on Aricept. However patient started getting confused, wife suspected UTI, patient went to hospital and was diagnosed with abnormal labs and a-fib. They are waiting to hear from cardiologist and neurologist regarding medication and when to resume aricept. He has had 2 falls in last week; He does take frequent naps and will sleep well at night; He does continue to have urinary leakage due to prostate surgery; In addition, patient is very hard of hearing; He denies any dizziness; He denies any numbness/tingling in feet; He uses a SPC intermittently; he is not consistent with using it due to poor memory; He does enjoy walking the dog multiple times a day but only goes a short distance; He does  report having to be careful to not let the dog pull him over or trip him;    Currently in Pain?  No/denies       INTERVENTION THIS DATE -side stepping, 1x21f bilat c 5000g ball at sternum -Retroamb as above 1x1273fc 5000g ball at sternum -side stepping over red mat with unilateral UE loading (7lb dumbbell in hand) 3x over mat and back with weight in each hand (to simulate yard tasks) -resisted cable walking with cable in hand foursquare pattern cable in hand to simulate dog walking/garden hose. swithching hands with 180 degree turns, laterally resisted load x2 minutes -up and over plastic step alternating hand load  )7lb dumbbell) with 180 degrees turn x4 minutes - FWD adn retroAMB with cable resisted "garden hose dragging" 3x1560fith cable in each hand 15lb  -Side stepping with lateral cable resistance at pelvis 2x15f30feach direction bilat 15lb, *difficulty with left sided eccentric control, but able to correct with cues.     PT Short Term Goals - 01/25/19 1036      PT SHORT TERM GOAL #1   Title  Patient will be adherent to HEP at least 3x a week to improve functional strength and balance for better safety at home.    Time  4    Period  Weeks    Status  On-Going (adherence currently not very good per wife)    Target Date  02/22/19      PT SHORT TERM GOAL #2   Title  Patient will deny any falls over past 2 weeks to demonstrate improved safety awareness at home and work.    Time  4    Period  Weeks    Status  Partially Met (1 near-fall LOB in yard)     Target Date  02/22/19        PT Long Term Goals - 01/25/19 1037      PT LONG TERM GOAL #1   Title  Patient will increase BLE gross strength to 4+/5 as to improve functional strength for independent gait, increased standing tolerance and increased ADL ability.    Time  8    Period  Weeks    Status  On-Going    Target Date  03/22/19      PT LONG TERM GOAL #2   Title  Patient will score >20 on mini best test to indicate low fall risk and exhibit improved general mobility with ADLs.    Time  8    Period  Weeks    Status  On-Going    Target Date  03/22/19      PT LONG TERM GOAL #3   Title  Patient will be independent in getting up from floor with minimal difficulty to improve fall recovery at home.    Time  8    Period  Weeks    Status  On-Going    Target Date  03/22/19      PT LONG TERM GOAL #4   Title  Patient will increase 10 meter walk test to >1.80m/s45m to improve gait speed for better community ambulation and to reduce fall risk.    Time  8    Period  Weeks    Status On-Going    Target Date  03/22/19            Plan  - 02/28/19 1238    Clinical Impression Statement This visit serving as patient's 10th PT visit and not note serving a progress report. Pt  thus far making good progress toward LT and ST goals, but continues to struggle with HEP compliance and using safe judgment at home. From recent reports per wife, cognition may play a bigger role in patient's historical falls. Pt remains enthusiastic and participate in PT session. Pt generally doing well this date. Able to participate in all activities as planned without fatgiue limitations. Pt given extra loud and proximal verbal cues for instruction, generally followed without difficult. Dynamic balance activities used this date to simulate pt's household activities, with multiplanar movement and assymetrical loading, however pt has good safety awareness and is cautious to a fault, but when cued to move quicker, balance deficits are more apparent. MinA required for stabilization for 3 instances, with retro or lateral movement. Pt continues to make progress in general.    Rehab Potential  Fair    PT Frequency  2x / week    PT Duration  8 weeks    PT Treatment/Interventions  Cryotherapy;Moist Heat;Gait training;Stair training;Functional mobility training;Therapeutic activities;Therapeutic exercise;Balance training;Neuromuscular re-education;Patient/family education;Orthotic Fit/Training;Energy conservation    PT Next Visit Plan  foot clearance, strengthening    PT Home Exercise Plan  no updates today    Consulted and Agree with Plan of Care  Patient       Patient will benefit from skilled therapeutic intervention in order to improve the following deficits and impairments:  Abnormal gait, Decreased activity tolerance, Decreased balance, Decreased endurance, Decreased knowledge of precautions, Decreased mobility, Decreased safety awareness, Decreased strength, Difficulty walking, Postural dysfunction  Visit Diagnosis: 1. Other abnormalities of gait and mobility   2.  Muscle weakness (generalized)   3. Unsteadiness on feet   4. Abnormal posture        Problem List Patient Active Problem List   Diagnosis Date Noted  . AMS (altered mental status) 01/18/2019  . Altered mental status 01/17/2019  . Incarcerated inguinal hernia 07/12/2018  . Right inguinal hernia 11/25/2016  . Empyema lung ([place])   . Pleural effusion on right   . Pleural effusion, bacterial   . Empyema ([place]) 06/17/2016  . Impingement syndrome of shoulder region 02/25/2016  . Shoulder pain 02/25/2016  . Subacromial impingement 02/25/2016  . Mixed hyperlipidemia 04/01/2015  . Murmur, cardiac 04/01/2015   12:46 PM, 02/28/19 Etta Grandchild, PT, DPT Physical Therapist - [facility]  Outpatient Physical Therapy- [place] [phone]     Etta Grandchild 02/28/2019, 12:44 PM  [place] MAIN [facility] SERVICES [address] [place], [place], [address] Phone: [phone]   Fax:  [phone]  Name: NAYTHAN DOUTHIT MRN: [id number] Date of Birth: 10-05-1930   *addendum on 8/24 to clarify progress report to MD.  12:21 PM, 03/12/19 Etta Grandchild, PT, DPT Physical Therapist - [facility]  [phone] [facility])

## 2019-03-05 ENCOUNTER — Other Ambulatory Visit: Payer: Self-pay

## 2019-03-05 ENCOUNTER — Ambulatory Visit: Payer: Medicare Other | Admitting: Physical Therapy

## 2019-03-05 ENCOUNTER — Encounter: Payer: Self-pay | Admitting: Physical Therapy

## 2019-03-05 DIAGNOSIS — R2689 Other abnormalities of gait and mobility: Secondary | ICD-10-CM

## 2019-03-05 DIAGNOSIS — R2681 Unsteadiness on feet: Secondary | ICD-10-CM

## 2019-03-05 DIAGNOSIS — M6281 Muscle weakness (generalized): Secondary | ICD-10-CM

## 2019-03-05 NOTE — Therapy (Signed)
[place] MAIN [facility] SERVICES [address] [place], [place], [address] Phone: [phone]   Fax:  [phone]  Physical Therapy Treatment Physical Therapy Progress Note   Dates of reporting period  01/23/19  to   03/05/19  Patient Details  Name: Jeffery [name] MRN: [id number] Date of Birth: June 21, 1931 Referring Provider (PT): Dr. Benita Stabile PCP   Encounter Date: 03/05/2019  PT End of Session - 03/05/19 1110    Visit Number  11    Number of Visits  17    Date for PT Re-Evaluation  03/22/19    Authorization Type  start of care 01/23/19    PT Start Time  1103    PT Stop Time  1145    PT Time Calculation (min)  42 min    Equipment Utilized During Treatment  Gait belt    Activity Tolerance  Patient tolerated treatment well;No increased pain    Behavior During Therapy  WFL for tasks assessed/performed       Past Medical History:  Diagnosis Date  . Does use hearing aid   . Empyema lung ([place]) 06/03/2016   MODERATE WBC PRESENT, PREDOMINANTLY PMN, STREP INTERMEDIUS  . Hyperlipidemia   . Pneumonia Nov/Dec 2017    Past Surgical History:  Procedure Laterality Date  . APPENDECTOMY  1971  . COLONOSCOPY  2014  . EYE SURGERY     cataract surgery   . GREEN LIGHT LASER TURP (TRANSURETHRAL RESECTION OF PROSTATE N/A 06/20/2018   Procedure: GREEN LIGHT LASER TURP (TRANSURETHRAL RESECTION OF PROSTATE;  Surgeon: Royston Cowper, MD;  Location: [facility] ORS;  Service: Urology;  Laterality: N/A;  . HERNIA REPAIR Left 2008  . INGUINAL HERNIA REPAIR Right 12/01/2016   Right inguinal hernia repair with medium Ultra Pro mesh.  . INGUINAL HERNIA REPAIR Left 07/12/2018   Procedure: HERNIA REPAIR INGUINAL ADULT;  Surgeon: Jules Husbands, MD;  Location: [facility] ORS;  Service: General;  Laterality: Left;  . INSERTION OF MESH Right 12/01/2016   Procedure: INSERTION OF MESH;  Surgeon: Robert Bellow, MD;  Location: [facility] ORS;  Service: General;  Laterality: Right;  .  TONSILLECTOMY  1944    There were no vitals filed for this visit.  Subjective Assessment - 03/05/19 1109    Subjective  Patient reports doing well; Denies any new falls; Reports no soreness or discomfort; Patient reports that he hasn't been doing his exercise at home stating, "Frankly, I think that coming to therapy 2x a week is all that I need."    Pertinent History  85 yo Male s/p prostate surgery in Dec 2019 and had subsequent PT in Jan 2020. PT did not help but was suggested that patient receive outpatient PT for weakness and imbalance. He was scheduled to start PT in April 2020 but then was unable to start due to coronavirus. Patient is now wanting to start PT. He has been having trouble with his memory and was recently started on Aricept. However patient started getting confused, wife suspected UTI, patient went to hospital and was diagnosed with abnormal labs and a-fib. They are waiting to hear from cardiologist and neurologist regarding medication and when to resume aricept. He has had 2 falls in last week; He does take frequent naps and will sleep well at night; He does continue to have urinary leakage due to prostate surgery; In addition, patient is very hard of hearing; He denies any dizziness; He denies any numbness/tingling in feet; He uses a [facility]  intermittently; he is not consistent with using it due to poor memory; He does enjoy walking the dog multiple times a day but only goes a short distance; He does report having to be careful to not let the dog pull him over or trip him;    Currently in Pain?  No/denies    Multiple Pain Sites  No         [facility] PT Assessment - 03/05/19 0001      Standardized Balance Assessment   10 Meter Walk  1.24 m/s without AD, community ambulator improved from 0.98 m/s on 01/25/19      Mini-BESTest   Sit To Stand  Normal: Comes to stand without use of hands and stabilizes independently.    Rise to Toes  Moderate: Heels up, but not full range (smaller than  when holding hands), OR noticeable instability for 3 s.    Stand on one leg (left)  Moderate: < 20 s    Stand on one leg (right)  Moderate: < 20 s    Stand on one leg - lowest score  1    Compensatory Stepping Correction - Forward  Moderate: More than one step is required to recover equilibrium    Compensatory Stepping Correction - Backward  Moderate: More than one step is required to recover equilibrium    Compensatory Stepping Correction - Left Lateral  Severe: Falls, or cannot step    Compensatory Stepping Correction - Right Lateral  Severe:  Falls, or cannot step    Stepping Corredtion Lateral - lowest score  0    Stance - Feet together, eyes open, firm surface   Normal: 30s    Stance - Feet together, eyes closed, foam surface   Normal: 30s    Incline - Eyes Closed  Normal: Stands independently 30s and aligns with gravity    Change in Gait Speed  Normal: Significantly changes walkling speed without imbalance    Walk with head turns - Horizontal  Normal: performs head turns with no change in gait speed and good balance    Walk with pivot turns  Moderate:Turns with feet close SLOW (>4 steps) with good balance.    Step over obstacles  Moderate: Steps over box but touches box OR displays cautious behavior by slowing gait.    Timed UP & GO with Dual Task  Severe: Stops counting while walking OR stops walking while counting.    Mini-BEST total score  18      Timed Up and Go Test   Normal TUG (seconds)  12.5    Cognitive TUG (seconds)  35    TUG Comments  significant difficulty with cognitive TUG being unable to complete counting while walking       TREATMENT: Warm up on Nustep BUE/BLE level 2 x4 min (Unbilled);  PT instructed patient in mini best and 10 meter walk to address goals, see above; He does exhibit improved balance compared to initial evaluation, but is still having significant difficulty with dual task TUG or new tasks.   Resisted walking 12. 5# forward/backward, side/side (4  way) x2 laps each with min A for safety with mod Vcs to increase step length and to increase foot clearance for improved gait safety; Patient does exhibit increased foot drag especially with eccentric return; he was able to improve foot clearance following verbal cues;  Response to treatment: patient tolerated well; he reports mild fatigue at end of session; patient reports that he hasn't been as adherent with HEP but  reports that he will start doing them more. He does exhibit improved gait safety with less foot drag especially when he isn't distracted. However with dual task or new environment, patient does exhibit unsteady gait with increased foot drag; Patient's condition has the potential to improve in response to therapy. Maximum improvement is yet to be obtained. The anticipated improvement is attainable and reasonable in a generally predictable time.  Patient reports less history of falls and reports that he has been more active at home in his garden.                     PT Education - 03/05/19 1110    Education Details  balance/progress towards goals;    Person(s) Educated  Patient    Methods  Explanation;Verbal cues    Comprehension  Verbalized understanding;Returned demonstration;Verbal cues required;Need further instruction       PT Short Term Goals - 03/05/19 1111      PT SHORT TERM GOAL #1   Title  Patient will be adherent to HEP at least 3x a week to improve functional strength and balance for better safety at home.    Baseline  8/17: not doing exercises;    Time  4    Period  Weeks    Status  Not Met    Target Date  02/22/19      PT SHORT TERM GOAL #2   Title  Patient will deny any falls over past 2 weeks to demonstrate improved safety awareness at home and work.    Baseline  8/17: no falls    Time  4    Period  Weeks    Status  Achieved    Target Date  02/22/19        PT Long Term Goals - 03/05/19 1112      PT LONG TERM GOAL #1   Title  Patient  will increase BLE gross strength to 4+/5 as to improve functional strength for independent gait, increased standing tolerance and increased ADL ability.    Time  8    Period  Weeks    Status  Partially Met    Target Date  03/22/19      PT LONG TERM GOAL #2   Title  Patient will score >20 on mini best test to indicate low fall risk and exhibit improved general mobility with ADLs.    Baseline  03/05/19: 18/28, improved from 15/28    Time  8    Period  Weeks    Status  Partially Met    Target Date  03/22/19      PT LONG TERM GOAL #3   Title  Patient will be independent in getting up from floor with minimal difficulty to improve fall recovery at home.    Time  8    Period  Weeks    Status  Partially Met    Target Date  03/22/19      PT LONG TERM GOAL #4   Title  Patient will increase 10 meter walk test to >1.34ms as to improve gait speed for better community ambulation and to reduce fall risk.    Baseline  8/17: 1.24 m/s    Time  8    Period  Weeks    Status  Achieved    Target Date  03/22/19            Plan - 03/05/19 1610    Clinical Impression Statement  Patient motivated and participated  well within session; PT addressed goals to determine progress since start of care. He does exhibit some improvement in balance as evidenced with improved mini best test. Patient did have significant difficulty with dual task TUG due to cognitive decline. He was instructed to work on increasing adherence with HEP for better static standing balance. He does continue to require cues for better safety awareness and demonstrate for understanding of new tasks. He would benefit from additional skilled PT intervention to address balance deficits and decreased mobility;    Rehab Potential  Fair    PT Frequency  2x / week    PT Duration  8 weeks    PT Treatment/Interventions  Cryotherapy;Moist Heat;Gait training;Stair training;Functional mobility training;Therapeutic activities;Therapeutic  exercise;Balance training;Neuromuscular re-education;Patient/family education;Orthotic Fit/Training;Energy conservation    PT Next Visit Plan  foot clearance, strengthening    PT Home Exercise Plan  no updates today    Consulted and Agree with Plan of Care  Patient       Patient will benefit from skilled therapeutic intervention in order to improve the following deficits and impairments:  Abnormal gait, Decreased activity tolerance, Decreased balance, Decreased endurance, Decreased knowledge of precautions, Decreased mobility, Decreased safety awareness, Decreased strength, Difficulty walking, Postural dysfunction  Visit Diagnosis: 1. Other abnormalities of gait and mobility   2. Muscle weakness (generalized)   3. Unsteadiness on feet        Problem List Patient Active Problem List   Diagnosis Date Noted  . AMS (altered mental status) 01/18/2019  . Altered mental status 01/17/2019  . Incarcerated inguinal hernia 07/12/2018  . Right inguinal hernia 11/25/2016  . Empyema lung ([place])   . Pleural effusion on right   . Pleural effusion, bacterial   . Empyema ([place]) 06/17/2016  . Impingement syndrome of shoulder region 02/25/2016  . Shoulder pain 02/25/2016  . Subacromial impingement 02/25/2016  . Mixed hyperlipidemia 04/01/2015  . Murmur, cardiac 04/01/2015    Shawnique Mariotti PT, DPT 03/05/2019, 4:13 PM  [place] MAIN [facility] SERVICES [address] [place], [place], [address] Phone: [phone]   Fax:  [phone]  Name: MICHAL CALLICOTT MRN: [id number] Date of Birth: 08-Dec-1930

## 2019-03-07 ENCOUNTER — Other Ambulatory Visit: Payer: Self-pay

## 2019-03-07 ENCOUNTER — Ambulatory Visit: Payer: Medicare Other | Admitting: Physical Therapy

## 2019-03-07 ENCOUNTER — Encounter: Payer: Self-pay | Admitting: Physical Therapy

## 2019-03-07 DIAGNOSIS — R2689 Other abnormalities of gait and mobility: Secondary | ICD-10-CM

## 2019-03-07 DIAGNOSIS — R2681 Unsteadiness on feet: Secondary | ICD-10-CM

## 2019-03-07 DIAGNOSIS — M6281 Muscle weakness (generalized): Secondary | ICD-10-CM

## 2019-03-07 NOTE — Therapy (Signed)
[place] [facility] REHAB SERVICES [address] [place], [place], [address] Phone: [phone]   Fax:  [phone]  Physical Therapy Treatment  Patient Details  Name: Jeffery [name] MRN: [id number] Date of Birth: 01/26/1931 Referring Provider (PT): Dr. Denny Tate PCP   Encounter Date: 03/07/2019  PT End of Session - 03/07/19 1254    Visit Number  12    Number of Visits  17    Date for PT Re-Evaluation  03/22/19    Authorization Type  start of care 01/23/19    PT Start Time  1102    PT Stop Time  1145    PT Time Calculation (min)  43 min    Equipment Utilized During Treatment  Gait belt    Activity Tolerance  Patient tolerated treatment well;No increased pain    Behavior During Therapy  WFL for tasks assessed/performed       Past Medical History:  Diagnosis Date  . Does use hearing aid   . Empyema lung ([facility]) 06/03/2016   MODERATE WBC PRESENT, PREDOMINANTLY PMN, STREP INTERMEDIUS  . Hyperlipidemia   . Pneumonia Nov/Dec 2017    Past Surgical History:  Procedure Laterality Date  . APPENDECTOMY  1971  . COLONOSCOPY  2014  . EYE SURGERY     cataract surgery   . GREEN LIGHT LASER TURP (TRANSURETHRAL RESECTION OF PROSTATE N/A 06/20/2018   Procedure: GREEN LIGHT LASER TURP (TRANSURETHRAL RESECTION OF PROSTATE;  Surgeon: Wolff, Michael R, MD;  Location: [facility] ORS;  Service: Urology;  Laterality: N/A;  . HERNIA REPAIR Left 2008  . INGUINAL HERNIA REPAIR Right 12/01/2016   Right inguinal hernia repair with medium Ultra Pro mesh.  . INGUINAL HERNIA REPAIR Left 07/12/2018   Procedure: HERNIA REPAIR INGUINAL ADULT;  Surgeon: Pabon, Diego F, MD;  Location: [facility] ORS;  Service: General;  Laterality: Left;  . INSERTION OF MESH Right 12/01/2016   Procedure: INSERTION OF MESH;  Surgeon: Byrnett, Jeffrey W, MD;  Location: [facility] ORS;  Service: General;  Laterality: Right;  . TONSILLECTOMY  1944    There were no vitals filed for this visit.  Subjective  Assessment - 03/07/19 1253    Subjective  Patient reports doing well; Denies any discomfort; Denies any new falls; Reports that he was trying some of the balance exercises last night with some difficulty;    Pertinent History  85 yo Male s/p prostate surgery in Dec 2019 and had subsequent PT in Jan 2020. PT did not help but was suggested that patient receive outpatient PT for weakness and imbalance. He was scheduled to start PT in April 2020 but then was unable to start due to coronavirus. Patient is now wanting to start PT. He has been having trouble with his memory and was recently started on Aricept. However patient started getting confused, wife suspected UTI, patient went to hospital and was diagnosed with abnormal labs and a-fib. They are waiting to hear from cardiologist and neurologist regarding medication and when to resume aricept. He has had 2 falls in last week; He does take frequent naps and will sleep well at night; He does continue to have urinary leakage due to prostate surgery; In addition, patient is very hard of hearing; He denies any dizziness; He denies any numbness/tingling in feet; He uses a [employer] intermittently; he is not consistent with using it due to poor memory; He does enjoy walking the dog multiple times a day but only goes a short distance; He does report   having to be careful to not let the dog pull him over or trip him;    Currently in Pain?  No/denies    Multiple Pain Sites  No                TREATMENT: Therapeutic exercise:  Warm up on treadmill 1.5 mph with 2 HHA x3 min with cues to increase step length and increase heel strike for less foot drag; Patient had increased difficulty with less HHA; required CGA for safety;   Standing calf stretch heel off step stretch 20 sec hold x2 reps bilaterally, required mod VCs for proper positioning for stretching;   Instructed patient in advanced LE strengthening: -hip abduction green tband x15 reps bilaterally; -hip  extension green tband x15 reps bilaterally; -diagonal stepping green tband forward/backward x10 feet x2 laps each direction with mod Vcs for sequencing and positioning to improve strength and gait safety; Patient required min-moderate verbal/tactile cues for correct exercise technique including cues to improve weight shift and slow down LE movement for better motor control;   Sit<>Stand with yellow weighted ball overhead press x10 reps with min VCs for forward weight shift to improve transfer ability;   NMR: Instructed patient in weaving around cones #6 x3 sets with mod VCs to increase step length, improve DF at heel strike for better gait negotiation; Patient exhibits increased foot drag with narrow environment;  Instructed patient in side step over cones #6 x1 set each direction with min A and mod VCs for sequencing and to increase hip flexion for better foot clearance; Patient able to exhibit good initial step but has difficulty stepping over with 2nd leg, having difficulty shifting weight with narrow base of support;  Gait crossovers x10 feet x3 laps each direction with therapist initially providing max VCs and visual cues for sequencing for better safety with dynamic stepping; Able to progress to supervision with minimal cues with better dynamic balance control and good sequencing;     Response to treatment: Patient very hard of hearing requiring increased instruction and demonstration for better activity technique; He was able to exhibit better dynamic balance control requiring less assistance with increased repetition. Patient does report mild fatigue at end of session.                            PT Education - 03/07/19 1254    Education Details  dynamic balance/strengthening;gait safety;    Person(s) Educated  Patient    Methods  Explanation;Verbal cues    Comprehension  Verbalized understanding;Returned demonstration;Verbal cues required;Need further instruction        PT Short Term Goals - 03/05/19 1111      PT SHORT TERM GOAL #1   Title  Patient will be adherent to HEP at least 3x a week to improve functional strength and balance for better safety at home.    Baseline  8/17: not doing exercises;    Time  4    Period  Weeks    Status  Not Met    Target Date  02/22/19      PT SHORT TERM GOAL #2   Title  Patient will deny any falls over past 2 weeks to demonstrate improved safety awareness at home and work.    Baseline  8/17: no falls    Time  4    Period  Weeks    Status  Achieved    Target Date  02/22/19  PT Long Term Goals - 03/05/19 1112      PT LONG TERM GOAL #1   Title  Patient will increase BLE gross strength to 4+/5 as to improve functional strength for independent gait, increased standing tolerance and increased ADL ability.    Time  8    Period  Weeks    Status  Partially Met    Target Date  03/22/19      PT LONG TERM GOAL #2   Title  Patient will score >20 on mini best test to indicate low fall risk and exhibit improved general mobility with ADLs.    Baseline  03/05/19: 18/28, improved from 15/28    Time  8    Period  Weeks    Status  Partially Met    Target Date  03/22/19      PT LONG TERM GOAL #3   Title  Patient will be independent in getting up from floor with minimal difficulty to improve fall recovery at home.    Time  8    Period  Weeks    Status  Partially Met    Target Date  03/22/19      PT LONG TERM GOAL #4   Title  Patient will increase 10 meter walk test to >1.0m/s as to improve gait speed for better community ambulation and to reduce fall risk.    Baseline  8/17: 1.24 m/s    Time  8    Period  Weeks    Status  Achieved    Target Date  03/22/19            Plan - 03/07/19 1254    Clinical Impression Statement  Patient motivated and participated well within session. he did require increased cues for better foot clearance with advanced gait tasks. Patient learns new exercises better with  visual cues and demonstration, having difficulty following complex commands. He did have difficulty weight shifting and stepping over cones due to imbalance and fatigue. He would benefit from additional skilled PT intervention to improve strength, balance and mobility;    Rehab Potential  Fair    PT Frequency  2x / week    PT Duration  8 weeks    PT Treatment/Interventions  Cryotherapy;Moist Heat;Gait training;Stair training;Functional mobility training;Therapeutic activities;Therapeutic exercise;Balance training;Neuromuscular re-education;Patient/family education;Orthotic Fit/Training;Energy conservation    PT Next Visit Plan  foot clearance, strengthening    PT Home Exercise Plan  no updates today    Consulted and Agree with Plan of Care  Patient       Patient will benefit from skilled therapeutic intervention in order to improve the following deficits and impairments:  Abnormal gait, Decreased activity tolerance, Decreased balance, Decreased endurance, Decreased knowledge of precautions, Decreased mobility, Decreased safety awareness, Decreased strength, Difficulty walking, Postural dysfunction  Visit Diagnosis: 1. Other abnormalities of gait and mobility   2. Muscle weakness (generalized)   3. Unsteadiness on feet        Problem List Patient Active Problem List   Diagnosis Date Noted  . AMS (altered mental status) 01/18/2019  . Altered mental status 01/17/2019  . Incarcerated inguinal hernia 07/12/2018  . Right inguinal hernia 11/25/2016  . Empyema lung ([facility])   . Pleural effusion on right   . Pleural effusion, bacterial   . Empyema ([facility]) 06/17/2016  . Impingement syndrome of shoulder region 02/25/2016  . Shoulder pain 02/25/2016  . Subacromial impingement 02/25/2016  . Mixed hyperlipidemia 04/01/2015  . Murmur, cardiac 04/01/2015    Trotter,Margaret   PT, DPT 03/07/2019, 12:56 PM   [facility] RE[facility]AB SERVICES [address] [place], [place], [address] Phone: [phone]   Fax:  [phone]  Name: Uzoma J Jakubiak MRN: [id number] Date of Birth: 12/17/1930   

## 2019-03-15 ENCOUNTER — Ambulatory Visit: Payer: Medicare Other | Admitting: Physical Therapy

## 2019-03-15 ENCOUNTER — Other Ambulatory Visit: Payer: Self-pay

## 2019-03-15 ENCOUNTER — Encounter: Payer: Self-pay | Admitting: Physical Therapy

## 2019-03-15 DIAGNOSIS — M6281 Muscle weakness (generalized): Secondary | ICD-10-CM

## 2019-03-15 DIAGNOSIS — R2689 Other abnormalities of gait and mobility: Secondary | ICD-10-CM

## 2019-03-15 DIAGNOSIS — R2681 Unsteadiness on feet: Secondary | ICD-10-CM

## 2019-03-15 NOTE — Therapy (Signed)
[place] MAIN [facility] SERVICES [address] [place], [place], [address] Phone: [phone]   Fax:  [phone]  Physical Therapy Treatment  Patient Details  Name: Jeffery [name] MRN: [id number] Date of Birth: [birth year] Referring Provider (PT): Dr. Benita Stabile PCP   Encounter Date: 03/15/2019  PT End of Session - 03/15/19 1102    Visit Number  13    Number of Visits  17    Date for PT Re-Evaluation  03/22/19    Authorization Type  start of care 01/23/19    PT Start Time  1102    PT Stop Time  1145    PT Time Calculation (min)  43 min    Equipment Utilized During Treatment  Gait belt    Activity Tolerance  Patient tolerated treatment well;No increased pain    Behavior During Therapy  WFL for tasks assessed/performed       Past Medical History:  Diagnosis Date  . Does use hearing aid   . Empyema lung ([place]) 06/03/2016   MODERATE WBC PRESENT, PREDOMINANTLY PMN, STREP INTERMEDIUS  . Hyperlipidemia   . Pneumonia Nov/Dec 2017    Past Surgical History:  Procedure Laterality Date  . APPENDECTOMY  1971  . COLONOSCOPY  2014  . EYE SURGERY     cataract surgery   . GREEN LIGHT LASER TURP (TRANSURETHRAL RESECTION OF PROSTATE N/A 06/20/2018   Procedure: GREEN LIGHT LASER TURP (TRANSURETHRAL RESECTION OF PROSTATE;  Surgeon: Royston Cowper, MD;  Location: [facility] ORS;  Service: Urology;  Laterality: N/A;  . HERNIA REPAIR Left 2008  . INGUINAL HERNIA REPAIR Right 12/01/2016   Right inguinal hernia repair with medium Ultra Pro mesh.  . INGUINAL HERNIA REPAIR Left 07/12/2018   Procedure: HERNIA REPAIR INGUINAL ADULT;  Surgeon: Jules Husbands, MD;  Location: [facility] ORS;  Service: General;  Laterality: Left;  . INSERTION OF MESH Right 12/01/2016   Procedure: INSERTION OF MESH;  Surgeon: Robert Bellow, MD;  Location: [facility] ORS;  Service: General;  Laterality: Right;  . TONSILLECTOMY  1944    There were no vitals filed for this visit.  Subjective  Assessment - 03/15/19 1216    Subjective  Patient reports doing well; His wife reports that she had to remind him to do his HEP but he was able to do them. She reports that he is still having trouble shuffling his feet when he picks up his plate and walks away from the table due to dual task challenge;    Pertinent History  85 yo Male s/p prostate surgery in Dec 2019 and had subsequent PT in Jan 2020. PT did not help but was suggested that patient receive outpatient PT for weakness and imbalance. He was scheduled to start PT in April 2020 but then was unable to start due to coronavirus. Patient is now wanting to start PT. He has been having trouble with his memory and was recently started on Aricept. However patient started getting confused, wife suspected UTI, patient went to hospital and was diagnosed with abnormal labs and a-fib. They are waiting to hear from cardiologist and neurologist regarding medication and when to resume aricept. He has had 2 falls in last week; He does take frequent naps and will sleep well at night; He does continue to have urinary leakage due to prostate surgery; In addition, patient is very hard of hearing; He denies any dizziness; He denies any numbness/tingling in feet; He uses a SPC intermittently; he is not consistent  with using it due to poor memory; He does enjoy walking the dog multiple times a day but only goes a short distance; He does report having to be careful to not let the dog pull him over or trip him;    Currently in Pain?  No/denies    Multiple Pain Sites  No             TREATMENT: Therapeutic exercise:  Warm up ontreadmill1.5 mph with 2 HHA x3 min with cues to increase step length and increase heel strike for less foot drag; Patient had increased difficulty with less HHA; required CGA for safety;   Standing calf stretch heel off step stretch 20 sec hold x2 reps bilaterally, required mod VCs for proper positioning for stretching;     NMR: Instructed patient in weaving around cones #6 x4 sets with mod VCs to increase step length, improve DF at heel strike for better gait negotiation; Patient exhibits increased foot drag with narrow environment; He had difficulty following instruction initially walking with cones between feet; He was able to demonstrate better understanding following therapist demonstration and max VCs.  Instructed patient in side step over cones #6 x1 set each direction with min A and mod VCs for sequencing and to increase hip flexion for better foot clearance; Patient able to exhibit good initial step but has difficulty stepping over with 2nd leg, having difficulty shifting weight with narrow base of support;  Walking around gym scanning environment looking and picking up various colored cones x10 min with max VCs for scanning and finding cones. Initially patient located approximately 50% of cones with 1 lap; He was able to increase accuracy to 75% with subsequent repetition. He also required cues to increase step length, improve foot clearance and increase gait speed as he exhibited increased shuffled step with added challenge of dual task;  Instructed patient in forward figure 8 walking around dyna disc x2 laps with mod VCs for sequencing and foot clearance; Upon understanding, progressed to forward/backward figure 8 walking x3 laps with max VCs for increasing step length and proper direction with directional changes;  Patient does have difficulty with backwards figure 8 walking with slower gait and short shuffled steps;  Instructed patient in figure 8 walking forward while holding 8 cones and calling out color of cones to challenge cognitive task x2 laps with close supervision; patient initially walks extremely slow often stopping while trying to call out colors. WIth increased repetition and instruction he was able to keep walking while calling out colors although continues to have shuffled steps;    Response to treatment: Patient very hard of hearing requiring increased instruction and demonstration for better activity technique; He was able to exhibit better dynamic balance control requiring less assistance with increased repetition. Patient does report mild fatigue at end of session.                         PT Education - 03/15/19 1101    Education Details  dynamic balance/strengthening; gait safety;    Person(s) Educated  Patient    Methods  Explanation;Verbal cues    Comprehension  Verbalized understanding;Returned demonstration;Verbal cues required;Need further instruction       PT Short Term Goals - 03/05/19 1111      PT SHORT TERM GOAL #1   Title  Patient will be adherent to HEP at least 3x a week to improve functional strength and balance for better safety at home.  Baseline  8/17: not doing exercises;    Time  4    Period  Weeks    Status  Not Met    Target Date  02/22/19      PT SHORT TERM GOAL #2   Title  Patient will deny any falls over past 2 weeks to demonstrate improved safety awareness at home and work.    Baseline  8/17: no falls    Time  4    Period  Weeks    Status  Achieved    Target Date  02/22/19        PT Long Term Goals - 03/05/19 1112      PT LONG TERM GOAL #1   Title  Patient will increase BLE gross strength to 4+/5 as to improve functional strength for independent gait, increased standing tolerance and increased ADL ability.    Time  8    Period  Weeks    Status  Partially Met    Target Date  03/22/19      PT LONG TERM GOAL #2   Title  Patient will score >20 on mini best test to indicate low fall risk and exhibit improved general mobility with ADLs.    Baseline  03/05/19: 18/28, improved from 15/28    Time  8    Period  Weeks    Status  Partially Met    Target Date  03/22/19      PT LONG TERM GOAL #3   Title  Patient will be independent in getting up from floor with minimal difficulty to improve fall  recovery at home.    Time  8    Period  Weeks    Status  Partially Met    Target Date  03/22/19      PT LONG TERM GOAL #4   Title  Patient will increase 10 meter walk test to >1.20ms as to improve gait speed for better community ambulation and to reduce fall risk.    Baseline  8/17: 1.24 m/s    Time  8    Period  Weeks    Status  Achieved    Target Date  03/22/19            Plan - 03/15/19 1219    Clinical Impression Statement  Instructed patient in dual task cognitive challenges this session. Patient had significant difficulty scanning environment and following single step instruction with cognitive tasks. He exhibits short step shuffled gait with significantly slower pace when having to concentrate on cogntive task. He would benefit from additional skilled PT intervention to improve dynamic balance and dual tasks ability;    Rehab Potential  Fair    PT Frequency  2x / week    PT Duration  8 weeks    PT Treatment/Interventions  Cryotherapy;Moist Heat;Gait training;Stair training;Functional mobility training;Therapeutic activities;Therapeutic exercise;Balance training;Neuromuscular re-education;Patient/family education;Orthotic Fit/Training;Energy conservation    PT Next Visit Plan  foot clearance, strengthening    PT Home Exercise Plan  no updates today    Consulted and Agree with Plan of Care  Patient       Patient will benefit from skilled therapeutic intervention in order to improve the following deficits and impairments:  Abnormal gait, Decreased activity tolerance, Decreased balance, Decreased endurance, Decreased knowledge of precautions, Decreased mobility, Decreased safety awareness, Decreased strength, Difficulty walking, Postural dysfunction  Visit Diagnosis: Other abnormalities of gait and mobility  Muscle weakness (generalized)  Unsteadiness on feet     Problem List Patient Active  Problem List   Diagnosis Date Noted  . AMS (altered mental status)  01/18/2019  . Altered mental status 01/17/2019  . Incarcerated inguinal hernia 07/12/2018  . Right inguinal hernia 11/25/2016  . Empyema lung ([place])   . Pleural effusion on right   . Pleural effusion, bacterial   . Empyema ([place]) 06/17/2016  . Impingement syndrome of shoulder region 02/25/2016  . Shoulder pain 02/25/2016  . Subacromial impingement 02/25/2016  . Mixed hyperlipidemia 04/01/2015  . Murmur, cardiac 04/01/2015    , PT, DPT 03/15/2019, 12:20 PM  [place] MAIN [facility] SERVICES [address] [place], [place], [address] Phone: [phone]   Fax:  [phone]  Name: KETIH GOODIE MRN: [id number] Date of Birth: 07/08/1931

## 2019-03-19 ENCOUNTER — Other Ambulatory Visit: Payer: Self-pay

## 2019-03-19 ENCOUNTER — Encounter: Payer: Self-pay | Admitting: Physical Therapy

## 2019-03-19 ENCOUNTER — Ambulatory Visit: Payer: Medicare Other | Admitting: Physical Therapy

## 2019-03-19 DIAGNOSIS — M6281 Muscle weakness (generalized): Secondary | ICD-10-CM

## 2019-03-19 DIAGNOSIS — R2681 Unsteadiness on feet: Secondary | ICD-10-CM

## 2019-03-19 DIAGNOSIS — R2689 Other abnormalities of gait and mobility: Secondary | ICD-10-CM

## 2019-03-19 NOTE — Therapy (Signed)
[place] MAIN [facility] SERVICES [address] [place], [place], [address] Phone: [phone]   Fax:  [phone]  Physical Therapy Treatment  Patient Details  Name: Jeffery [name] MRN: [id number] Date of Birth: December 18, 1930 Referring Provider (PT): Dr. Benita Stabile PCP   Encounter Date: 03/19/2019  PT End of Session - 03/19/19 1404    Visit Number  14    Number of Visits  17    Date for PT Re-Evaluation  03/22/19    Authorization Type  start of care 01/23/19    PT Start Time  1302    PT Stop Time  1345    PT Time Calculation (min)  43 min    Equipment Utilized During Treatment  Gait belt    Activity Tolerance  Patient tolerated treatment well;No increased pain    Behavior During Therapy  WFL for tasks assessed/performed       Past Medical History:  Diagnosis Date  . Does use hearing aid   . Empyema lung ([place]) 06/03/2016   MODERATE WBC PRESENT, PREDOMINANTLY PMN, STREP INTERMEDIUS  . Hyperlipidemia   . Pneumonia Nov/Dec 2017    Past Surgical History:  Procedure Laterality Date  . APPENDECTOMY  1971  . COLONOSCOPY  2014  . EYE SURGERY     cataract surgery   . GREEN LIGHT LASER TURP (TRANSURETHRAL RESECTION OF PROSTATE N/A 06/20/2018   Procedure: GREEN LIGHT LASER TURP (TRANSURETHRAL RESECTION OF PROSTATE;  Surgeon: Royston Cowper, MD;  Location: [facility] ORS;  Service: Urology;  Laterality: N/A;  . HERNIA REPAIR Left 2008  . INGUINAL HERNIA REPAIR Right 12/01/2016   Right inguinal hernia repair with medium Ultra Pro mesh.  . INGUINAL HERNIA REPAIR Left 07/12/2018   Procedure: HERNIA REPAIR INGUINAL ADULT;  Surgeon: Jules Husbands, MD;  Location: [facility] ORS;  Service: General;  Laterality: Left;  . INSERTION OF MESH Right 12/01/2016   Procedure: INSERTION OF MESH;  Surgeon: Robert Bellow, MD;  Location: [facility] ORS;  Service: General;  Laterality: Right;  . TONSILLECTOMY  1944    There were no vitals filed for this visit.  Subjective  Assessment - 03/19/19 1403    Subjective  Patient reports doing well; Denies any new falls; Reports staying active at home;    Pertinent History  85 yo Male s/p prostate surgery in Dec 2019 and had subsequent PT in Jan 2020. PT did not help but was suggested that patient receive outpatient PT for weakness and imbalance. He was scheduled to start PT in April 2020 but then was unable to start due to coronavirus. Patient is now wanting to start PT. He has been having trouble with his memory and was recently started on Aricept. However patient started getting confused, wife suspected UTI, patient went to hospital and was diagnosed with abnormal labs and a-fib. They are waiting to hear from cardiologist and neurologist regarding medication and when to resume aricept. He has had 2 falls in last week; He does take frequent naps and will sleep well at night; He does continue to have urinary leakage due to prostate surgery; In addition, patient is very hard of hearing; He denies any dizziness; He denies any numbness/tingling in feet; He uses a SPC intermittently; he is not consistent with using it due to poor memory; He does enjoy walking the dog multiple times a day but only goes a short distance; He does report having to be careful to not let the dog pull him over or  trip him;    Currently in Pain?  No/denies             TREATMENT: NMR: Warm up ontreadmill1.5 mph slowed to 1.0 mph with 2 HHA x3-22mn with cues to increase step length and increase heel strike for less foot drag; Patient had increased difficulty with less HHA; progressed dual task challenge with counting backwards from 100 by 3's x1 min of walking with significant difficulty; required CGA for safety; When cognitive task added, patient stopped walking, with therapist having to stop treadmill to avoid fall. Patient re-educated in task requiring increased instruction; He was able to walk approximately 1 min but had significant difficulty  counting;    Instructed patient in weaving around cones #6 x4 sets with mod VCs to increase step length, improve DF at heel strike for better gait negotiation; Patient exhibits increased foot drag with narrow environment; Increased difficulty having patient holding a ball and calling out color of cones;  Walking beside cones and toe tapping while calling out cone color, holding ball X7 x1 set each; required CGA for safety and cues for sequencing and positioning;   Walking around gym scanning environment looking and picking up various colored cones x10 min with max VCs for scanning and finding cones. Initially patient located approximately 70% of cones with 1 lap; He was able to increase accuracy to 80-90% with subsequent repetition. He also required cues to increase step length, improve foot clearance and increase gait speed as he exhibited increased shuffled step with added challenge of dual task;  In hallway:  Patient side stepping while throwing ball at wall and catching it x20 feet x 1 set each direction; Required max VCs for proper exercise technique as patient had difficulty side stepping and throwing ball at same time, often doing one or the other.   4 square stepping clockwise x3 reps unsupported, no difficulty; 4 square stepping clockwise with counting backwards from 50 by 4's x3 reps  Progressed to 4 square stepping counterclockwise counting forward from 20 by 4's with significant difficulty changing direction in stepping and counting; Patient required max VCs for sequencing and correct activity technique with directional change;   Response to treatment: Patient very hard of hearing requiring increased instruction and demonstration for better activity technique; He was able to exhibit better dynamic balance control requiring less assistance with increased repetition. Patient does report mild fatigue at end of session.He exhibits improved accuracy with environmental scanning;  Increased dual task difficulty with increased cognitive challenge; Advanced HEP with instruction for wife to add cognitive challenge for added difficulty;                      PT Education - 03/19/19 1403    Education Details  dynamic balance/dual task cognitive tasks; HEP    Person(s) Educated  Patient    Methods  Explanation;Verbal cues    Comprehension  Verbalized understanding;Returned demonstration;Verbal cues required;Need further instruction       PT Short Term Goals - 03/05/19 1111      PT SHORT TERM GOAL #1   Title  Patient will be adherent to HEP at least 3x a week to improve functional strength and balance for better safety at home.    Baseline  8/17: not doing exercises;    Time  4    Period  Weeks    Status  Not Met    Target Date  02/22/19      PT SHORT TERM GOAL #2  Title  Patient will deny any falls over past 2 weeks to demonstrate improved safety awareness at home and work.    Baseline  8/17: no falls    Time  4    Period  Weeks    Status  Achieved    Target Date  02/22/19        PT Long Term Goals - 03/05/19 1112      PT LONG TERM GOAL #1   Title  Patient will increase BLE gross strength to 4+/5 as to improve functional strength for independent gait, increased standing tolerance and increased ADL ability.    Time  8    Period  Weeks    Status  Partially Met    Target Date  03/22/19      PT LONG TERM GOAL #2   Title  Patient will score >20 on mini best test to indicate low fall risk and exhibit improved general mobility with ADLs.    Baseline  03/05/19: 18/28, improved from 15/28    Time  8    Period  Weeks    Status  Partially Met    Target Date  03/22/19      PT LONG TERM GOAL #3   Title  Patient will be independent in getting up from floor with minimal difficulty to improve fall recovery at home.    Time  8    Period  Weeks    Status  Partially Met    Target Date  03/22/19      PT LONG TERM GOAL #4   Title  Patient will  increase 10 meter walk test to >1.4ms as to improve gait speed for better community ambulation and to reduce fall risk.    Baseline  8/17: 1.24 m/s    Time  8    Period  Weeks    Status  Achieved    Target Date  03/22/19            Plan - 03/19/19 1359    Clinical Impression Statement  Patient motivated and tolerated session well; He was more alert and able to follow commands, being able to progress dual task challenge; He does have significant difficulty when asked cognitive task during motor task; for example when walking on treadmill, when asked to count backwards patient often stops walking having difficulty continuing with motor movement; He does exhibit improved accuracy with finding cones this session compared to previous sessions, exhibiting improved environmental scanning; he would benefit from additional skilled PT Intervention to improve gait safety and balance;    Rehab Potential  Fair    PT Frequency  2x / week    PT Duration  8 weeks    PT Treatment/Interventions  Cryotherapy;Moist Heat;Gait training;Stair training;Functional mobility training;Therapeutic activities;Therapeutic exercise;Balance training;Neuromuscular re-education;Patient/family education;Orthotic Fit/Training;Energy conservation    PT Next Visit Plan  foot clearance, strengthening    PT Home Exercise Plan  no updates today    Consulted and Agree with Plan of Care  Patient       Patient will benefit from skilled therapeutic intervention in order to improve the following deficits and impairments:  Abnormal gait, Decreased activity tolerance, Decreased balance, Decreased endurance, Decreased knowledge of precautions, Decreased mobility, Decreased safety awareness, Decreased strength, Difficulty walking, Postural dysfunction  Visit Diagnosis: Other abnormalities of gait and mobility  Muscle weakness (generalized)  Unsteadiness on feet     Problem List Patient Active Problem List   Diagnosis Date  Noted  . AMS (altered mental  status) 01/18/2019  . Altered mental status 01/17/2019  . Incarcerated inguinal hernia 07/12/2018  . Right inguinal hernia 11/25/2016  . Empyema lung ([place])   . Pleural effusion on right   . Pleural effusion, bacterial   . Empyema ([place]) 06/17/2016  . Impingement syndrome of shoulder region 02/25/2016  . Shoulder pain 02/25/2016  . Subacromial impingement 02/25/2016  . Mixed hyperlipidemia 04/01/2015  . Murmur, cardiac 04/01/2015    , PT, DPT 03/19/2019, 2:04 PM  [place] MAIN [facility] SERVICES [address] [place], [place], [address] Phone: [phone]   Fax:  [phone]  Name: Jeffery [name] MRN: [id number] Date of Birth: [birth year]

## 2019-03-21 ENCOUNTER — Encounter: Payer: Self-pay | Admitting: Physical Therapy

## 2019-03-21 ENCOUNTER — Ambulatory Visit: Payer: Medicare Other | Attending: Internal Medicine | Admitting: Physical Therapy

## 2019-03-21 ENCOUNTER — Other Ambulatory Visit: Payer: Self-pay

## 2019-03-21 DIAGNOSIS — R2689 Other abnormalities of gait and mobility: Secondary | ICD-10-CM | POA: Diagnosis present

## 2019-03-21 DIAGNOSIS — M6281 Muscle weakness (generalized): Secondary | ICD-10-CM | POA: Insufficient documentation

## 2019-03-21 DIAGNOSIS — R293 Abnormal posture: Secondary | ICD-10-CM | POA: Insufficient documentation

## 2019-03-21 DIAGNOSIS — R2681 Unsteadiness on feet: Secondary | ICD-10-CM | POA: Insufficient documentation

## 2019-03-21 NOTE — Therapy (Signed)
[place] MAIN [facility] SERVICES [address] [place], [place], [address] Phone: [phone]   Fax:  [phone]  Physical Therapy Treatment  Patient Details  Name: Jeffery [name] MRN: [id number] Date of Birth: 1931/02/10 Referring Provider (PT): Dr. Benita Stabile PCP   Encounter Date: 03/21/2019  PT End of Session - 03/21/19 1458    Visit Number  15    Number of Visits  17    Date for PT Re-Evaluation  03/22/19    Authorization Type  start of care 01/23/19    PT Start Time  1346    PT Stop Time  1430    PT Time Calculation (min)  44 min    Equipment Utilized During Treatment  Gait belt    Activity Tolerance  Patient tolerated treatment well;No increased pain    Behavior During Therapy  WFL for tasks assessed/performed       Past Medical History:  Diagnosis Date  . Does use hearing aid   . Empyema lung ([place]) 06/03/2016   MODERATE WBC PRESENT, PREDOMINANTLY PMN, STREP INTERMEDIUS  . Hyperlipidemia   . Pneumonia Nov/Dec 2017    Past Surgical History:  Procedure Laterality Date  . APPENDECTOMY  1971  . COLONOSCOPY  2014  . EYE SURGERY     cataract surgery   . GREEN LIGHT LASER TURP (TRANSURETHRAL RESECTION OF PROSTATE N/A 06/20/2018   Procedure: GREEN LIGHT LASER TURP (TRANSURETHRAL RESECTION OF PROSTATE;  Surgeon: Royston Cowper, MD;  Location: [facility] ORS;  Service: Urology;  Laterality: N/A;  . HERNIA REPAIR Left 2008  . INGUINAL HERNIA REPAIR Right 12/01/2016   Right inguinal hernia repair with medium Ultra Pro mesh.  . INGUINAL HERNIA REPAIR Left 07/12/2018   Procedure: HERNIA REPAIR INGUINAL ADULT;  Surgeon: Jules Husbands, MD;  Location: [facility] ORS;  Service: General;  Laterality: Left;  . INSERTION OF MESH Right 12/01/2016   Procedure: INSERTION OF MESH;  Surgeon: Robert Bellow, MD;  Location: [facility] ORS;  Service: General;  Laterality: Right;  . TONSILLECTOMY  1944    There were no vitals filed for this visit.  Subjective  Assessment - 03/21/19 1457    Subjective  Patient reports doing well; Denies any pain; Denies any new falls; Wife reports they tried the cognitive task with HEP with some difficulty;    Pertinent History  85 yo Male s/p prostate surgery in Dec 2019 and had subsequent PT in Jan 2020. PT did not help but was suggested that patient receive outpatient PT for weakness and imbalance. He was scheduled to start PT in April 2020 but then was unable to start due to coronavirus. Patient is now wanting to start PT. He has been having trouble with his memory and was recently started on Aricept. However patient started getting confused, wife suspected UTI, patient went to hospital and was diagnosed with abnormal labs and a-fib. They are waiting to hear from cardiologist and neurologist regarding medication and when to resume aricept. He has had 2 falls in last week; He does take frequent naps and will sleep well at night; He does continue to have urinary leakage due to prostate surgery; In addition, patient is very hard of hearing; He denies any dizziness; He denies any numbness/tingling in feet; He uses a SPC intermittently; he is not consistent with using it due to poor memory; He does enjoy walking the dog multiple times a day but only goes a short distance; He does report having to be  careful to not let the dog pull him over or trip him;    Currently in Pain?  No/denies    Multiple Pain Sites  No               TREATMENT: NMR: Warm up ontreadmill1.5 mph slowed to 1.0 mph with 2 HHA x2-56mn with cues to increase step length and increase heel strike for less foot drag; Patient had increased difficulty with heavy lean through BUE and less foot clearance;  Instructed patient in gait in hallway: -forward walking while recalling 5 bodies of water that patient [place] [employer] [place] took patient 8 laps to recall 4 bodies of water with significantly slower gait speed and mod VCs to increase foot  clearance and improve step length; Patient often stopping and thinking having difficulty staying on task of walking and thinking; -forward walking with lateral head turns calling out cards (red or black) x2 laps each with cues to increase gait speed; patient able to exhibit improved foot clearance but often slows down gait speed; Patient initially was 60% accurate but then able to get to 100% accuracy with slower gait speed;  -side stepping with ball toss x60 feet x1 lap each direction with mod VCs to increase foot clearance; initially exhibits heavy right foot drag; -forward/backward walking with ball pass side/side x80 feet x2 laps with significant slower speed and shorter step length when walking backwards. Patient required cues to increase step length and was able to self correct with instruction;   Side stepping over cones #7 with min A x1 rep each direction with mod VCs to increase hip flexion for foot clearance;   Walking around gym scanning environment looking and picking up various colored cones x4 min with max VCs for scanning and finding cones. 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  Patient does report mild fatigue at end of session.He exhibits improved accuracy with environmental scanning; Increased dual task difficulty with increased cognitive challenge;                      PT Education - 03/21/19 1458    Education Details  dynamic balance/gait safety;    Person(s) Educated  Patient    Methods  Explanation    Comprehension  Verbalized understanding       PT Short Term Goals - 03/05/19 1111      PT SHORT TERM GOAL #1   Title  Patient will be adherent to HEP at least 3x a week to improve functional strength and balance for better safety at home.    Baseline  8/17: not doing exercises;    Time  4    Period  Weeks    Status  Not Met  Target Date  02/22/19      PT SHORT TERM GOAL #2   Title  Patient will deny any falls over past 2 weeks to demonstrate improved safety awareness at home and work.    Baseline  8/17: no falls    Time  4    Period  Weeks    Status  Achieved    Target Date  02/22/19        PT Long Term Goals - 03/05/19 1112      PT LONG TERM GOAL #1   Title  Patient will increase BLE gross strength to 4+/5 as to improve functional strength for independent gait, increased standing tolerance and increased ADL ability.    Time  8    Period  Weeks    Status  Partially Met    Target Date  03/22/19      PT LONG TERM GOAL #2   Title  Patient will score >20 on mini best test to indicate low fall risk and exhibit improved general mobility with ADLs.    Baseline  03/05/19: 18/28, improved from 15/28    Time  8    Period  Weeks    Status  Partially Met    Target Date  03/22/19      PT LONG TERM GOAL #3   Title  Patient will be independent in getting up from floor with minimal difficulty to improve fall recovery at home.    Time  8    Period  Weeks    Status  Partially Met    Target Date  03/22/19      PT LONG TERM GOAL #4   Title  Patient will increase 10 meter walk test to >1.52ms as to improve gait speed for better  community ambulation and to reduce fall risk.    Baseline  8/17: 1.24 m/s    Time  8    Period  Weeks    Status  Achieved    Target Date  03/22/19            Plan - 03/21/19 1458    Clinical Impression Statement  Patient motivated and tolerated session well; He continues to have significant difficulty with dual task with slower gait speed. However he exhibits slightly less foot drag this session despite slower gait speed. Patient required min-mod VCs for proper exercise technique including prompting for dual task challenge; He would benefit from additional skilled PT intervention to improve strength, balance and gait safety especially with dual tasks.    Rehab Potential  Fair    PT Frequency  2x / week    PT Duration  8 weeks    PT Treatment/Interventions  Cryotherapy;Moist Heat;Gait training;Stair training;Functional mobility training;Therapeutic activities;Therapeutic exercise;Balance training;Neuromuscular re-education;Patient/family education;Orthotic Fit/Training;Energy conservation    PT Next Visit Plan  foot clearance, strengthening    PT Home Exercise Plan  no updates today    Consulted and Agree with Plan of Care  Patient       Patient will benefit from skilled therapeutic intervention in order to improve the following deficits and impairments:  Abnormal gait, Decreased activity tolerance, Decreased balance, Decreased endurance, Decreased knowledge of precautions, Decreased mobility, Decreased safety awareness, Decreased strength, Difficulty walking, Postural dysfunction  Visit Diagnosis: Other abnormalities of gait and mobility  Muscle weakness (generalized)  Unsteadiness on feet     Problem List Patient Active Problem List   Diagnosis Date Noted  . AMS (altered mental status) 01/18/2019  . Altered mental status  01/17/2019  . Incarcerated inguinal hernia 07/12/2018  . Right inguinal hernia 11/25/2016  . Empyema lung ([place])   . Pleural effusion on right   .  Pleural effusion, bacterial   . Empyema ([place]) 06/17/2016  . Impingement syndrome of shoulder region 02/25/2016  . Shoulder pain 02/25/2016  . Subacromial impingement 02/25/2016  . Mixed hyperlipidemia 04/01/2015  . Murmur, cardiac 04/01/2015    Dez Stauffer PT, DPT 03/21/2019, 3:00 PM  [place] MAIN [facility] SERVICES [address] [place], [place], [address] Phone: [phone]   Fax:  [phone]  Name: BRYANT SAYE MRN: [id number] Date of Birth: Sep 01, 1930

## 2019-03-27 ENCOUNTER — Ambulatory Visit: Payer: Medicare Other | Admitting: Physical Therapy

## 2019-04-03 ENCOUNTER — Other Ambulatory Visit: Payer: Self-pay

## 2019-04-03 ENCOUNTER — Ambulatory Visit: Payer: Medicare Other | Admitting: Physical Therapy

## 2019-04-03 ENCOUNTER — Encounter: Payer: Self-pay | Admitting: Physical Therapy

## 2019-04-03 DIAGNOSIS — R293 Abnormal posture: Secondary | ICD-10-CM

## 2019-04-03 DIAGNOSIS — R2689 Other abnormalities of gait and mobility: Secondary | ICD-10-CM | POA: Diagnosis not present

## 2019-04-03 DIAGNOSIS — M6281 Muscle weakness (generalized): Secondary | ICD-10-CM

## 2019-04-03 DIAGNOSIS — R2681 Unsteadiness on feet: Secondary | ICD-10-CM

## 2019-04-03 NOTE — Therapy (Signed)
[place] MAIN [facility] SERVICES [address] [place], [place], [address] Phone: [phone]   Fax:  [phone]  Physical Therapy Treatment  Patient Details  Name: Jeffery [name] MRN: [id number] Date of Birth: 1931-01-21 Referring Provider (PT): Dr. Benita Stabile PCP   Encounter Date: 04/03/2019  PT End of Session - 04/03/19 1556    Visit Number  16    Number of Visits  25    Date for PT Re-Evaluation  05/01/19    Authorization Type  goals last assessed 04/03/19    PT Start Time  1105    PT Stop Time  1150    PT Time Calculation (min)  45 min    Equipment Utilized During Treatment  Gait belt    Activity Tolerance  Patient tolerated treatment well;No increased pain    Behavior During Therapy  WFL for tasks assessed/performed       Past Medical History:  Diagnosis Date  . Does use hearing aid   . Empyema lung ([place]) 06/03/2016   MODERATE WBC PRESENT, PREDOMINANTLY PMN, STREP INTERMEDIUS  . Hyperlipidemia   . Pneumonia Nov/Dec 2017    Past Surgical History:  Procedure Laterality Date  . APPENDECTOMY  1971  . COLONOSCOPY  2014  . EYE SURGERY     cataract surgery   . GREEN LIGHT LASER TURP (TRANSURETHRAL RESECTION OF PROSTATE N/A 06/20/2018   Procedure: GREEN LIGHT LASER TURP (TRANSURETHRAL RESECTION OF PROSTATE;  Surgeon: Royston Cowper, MD;  Location: [facility] ORS;  Service: Urology;  Laterality: N/A;  . HERNIA REPAIR Left 2008  . INGUINAL HERNIA REPAIR Right 12/01/2016   Right inguinal hernia repair with medium Ultra Pro mesh.  . INGUINAL HERNIA REPAIR Left 07/12/2018   Procedure: HERNIA REPAIR INGUINAL ADULT;  Surgeon: Jules Husbands, MD;  Location: [facility] ORS;  Service: General;  Laterality: Left;  . INSERTION OF MESH Right 12/01/2016   Procedure: INSERTION OF MESH;  Surgeon: Robert Bellow, MD;  Location: [facility] ORS;  Service: General;  Laterality: Right;  . TONSILLECTOMY  1944    There were no vitals filed for this  visit.  Subjective Assessment - 04/03/19 1111    Subjective  Patient reports doing well; His wife reports that he has experienced less shuffled steps and slightly better walking; Denies any new falls;    Pertinent History  85 yo Male s/p prostate surgery in Dec 2019 and had subsequent PT in Jan 2020. PT did not help but was suggested that patient receive outpatient PT for weakness and imbalance. He was scheduled to start PT in April 2020 but then was unable to start due to coronavirus. Patient is now wanting to start PT. He has been having trouble with his memory and was recently started on Aricept. However patient started getting confused, wife suspected UTI, patient went to hospital and was diagnosed with abnormal labs and a-fib. They are waiting to hear from cardiologist and neurologist regarding medication and when to resume aricept. He has had 2 falls in last week; He does take frequent naps and will sleep well at night; He does continue to have urinary leakage due to prostate surgery; In addition, patient is very hard of hearing; He denies any dizziness; He denies any numbness/tingling in feet; He uses a SPC intermittently; he is not consistent with using it due to poor memory; He does enjoy walking the dog multiple times a day but only goes a short distance; He does report having to be careful  to not let the dog pull him over or trip him;    Currently in Pain?  No/denies    Multiple Pain Sites  No         [facility] PT Assessment - 04/03/19 0001      Strength   Overall Strength Comments  grossly 4+/; exceptions: L abd, R add, BLE ext are each 4-/5      Standardized Balance Assessment   10 Meter Walk  1.22 m/s without AD with L LE shuffle; 0.89 m/s without foot drag       Mini-BESTest   Sit To Stand  Normal: Comes to stand without use of hands and stabilizes independently.    Rise to Toes  Normal: Stable for 3 s with maximum height.    Stand on one leg (left)  Moderate: < 20 s   2 sec    Stand on one leg (right)  Moderate: < 20 s   2 sec   Stand on one leg - lowest score  1    Compensatory Stepping Correction - Forward  Normal: Recovers independently with a single, large step (second realignement is allowed).    Compensatory Stepping Correction - Backward  Moderate: More than one step is required to recover equilibrium    Compensatory Stepping Correction - Left Lateral  Severe: Falls, or cannot step    Compensatory Stepping Correction - Right Lateral  Severe:  Falls, or cannot step    Stepping Corredtion Lateral - lowest score  0    Stance - Feet together, eyes open, firm surface   Normal: 30s    Stance - Feet together, eyes closed, foam surface   Normal: 30s    Incline - Eyes Closed  Normal: Stands independently 30s and aligns with gravity    Change in Gait Speed  Normal: Significantly changes walkling speed without imbalance    Walk with head turns - Horizontal  Normal: performs head turns with no change in gait speed and good balance    Walk with pivot turns  Moderate:Turns with feet close SLOW (>4 steps) with good balance.    Step over obstacles  Normal: Able to step over box with minimal change of gait speed and with good balance.    Timed UP & GO with Dual Task  Severe: Stops counting while walking OR stops walking while counting.    Mini-BEST total score  21      Timed Up and Go Test   Normal TUG (seconds)  15.26    Cognitive TUG (seconds)  30          TREATMENT: Instructed patient in mini best, 10 meter walk and other outcome measures to address goals. Patient requires min VCs for proper exercise technique and to improve foot clearance with gait with less drag.  Reinforced HEP; Educated pt and caregiver in plan of care;                  PT Education - 04/03/19 1556    Education Details  balance/progress towards goals;    Person(s) Educated  Patient    Methods  Explanation;Verbal cues    Comprehension  Verbalized understanding;Returned  demonstration;Verbal cues required;Need further instruction       PT Short Term Goals - 04/03/19 1112      PT SHORT TERM GOAL #1   Title  Patient will be adherent to HEP at least 3x a week to improve functional strength and balance for better safety at home.  Baseline  8/17: not doing exercises;    Time  4    Period  Weeks    Status  Partially Met    Target Date  02/22/19      PT SHORT TERM GOAL #2   Title  Patient will deny any falls over past 2 weeks to demonstrate improved safety awareness at home and work.    Baseline  8/17: no falls    Time  4    Period  Weeks    Status  Achieved    Target Date  02/22/19        PT Long Term Goals - 04/03/19 1113      PT LONG TERM GOAL #1   Title  Patient will increase BLE gross strength to 4+/5 as to improve functional strength for independent gait, increased standing tolerance and increased ADL ability.    Time  4    Period  Weeks    Status  Partially Met    Target Date  05/01/19      PT LONG TERM GOAL #2   Title  Patient will score >20 on mini best test to indicate low fall risk and exhibit improved general mobility with ADLs.    Baseline  03/05/19: 18/28, improved from 15/28    Time  4    Period  Weeks    Status  Partially Met    Target Date  05/01/19      PT LONG TERM GOAL #3   Title  Patient will be independent in getting up from floor with minimal difficulty to improve fall recovery at home.    Time  4    Period  Weeks    Status  Partially Met    Target Date  05/01/19      PT LONG TERM GOAL #4   Title  Patient will increase 10 meter walk test to >1.83ms as to improve gait speed for better community ambulation and to reduce fall risk.    Baseline  8/17: 1.24 m/s    Time  4    Period  Weeks    Status  Achieved    Target Date  05/01/19            Plan - 04/03/19 1556    Clinical Impression Statement  Patient is progressing well. He exhibits improved balance and gait speed, making progress towards all goals. He  does ambulate slightly slower when cued to increase DF at heel strike and reduce foot drag. Patient also continues to have difficulty with dual task, walking slower and with more imbalance when distracted. He exhibits improvement in hip strength, although continues to have weakness in posterior hip musculature. Patient would benefit from additional skilled PT Intervention to improve strength, balance and gait safety;    Rehab Potential  Fair    PT Frequency  2x / week    PT Duration  4 weeks    PT Treatment/Interventions  Cryotherapy;Moist Heat;Gait training;Stair training;Functional mobility training;Therapeutic activities;Therapeutic exercise;Balance training;Neuromuscular re-education;Patient/family education;Orthotic Fit/Training;Energy conservation    PT Next Visit Plan  foot clearance, strengthening    PT Home Exercise Plan  no updates today    Consulted and Agree with Plan of Care  Patient       Patient will benefit from skilled therapeutic intervention in order to improve the following deficits and impairments:  Abnormal gait, Decreased activity tolerance, Decreased balance, Decreased endurance, Decreased knowledge of precautions, Decreased mobility, Decreased safety awareness, Decreased strength, Difficulty walking, Postural dysfunction  Visit Diagnosis: Other abnormalities of gait and mobility  Muscle weakness (generalized)  Unsteadiness on feet  Abnormal posture     Problem List Patient Active Problem List   Diagnosis Date Noted  . AMS (altered mental status) 01/18/2019  . Altered mental status 01/17/2019  . Incarcerated inguinal hernia 07/12/2018  . Right inguinal hernia 11/25/2016  . Empyema lung ([place])   . Pleural effusion on right   . Pleural effusion, bacterial   . Empyema ([place]) 06/17/2016  . Impingement syndrome of shoulder region 02/25/2016  . Shoulder pain 02/25/2016  . Subacromial impingement 02/25/2016  . Mixed hyperlipidemia 04/01/2015  . Murmur, cardiac  04/01/2015    Trotter,Margaret PT, DPT 04/03/2019, 3:59 PM  [place] MAIN [facility] SERVICES [address] [place], [place], [address] Phone: [phone]   Fax:  [phone]  Name: Jeffery [name] MRN: [id number] Date of Birth: 11-09-1930

## 2019-04-05 ENCOUNTER — Ambulatory Visit: Payer: Medicare Other | Admitting: Physical Therapy

## 2019-04-10 ENCOUNTER — Ambulatory Visit: Payer: Medicare Other | Admitting: Physical Therapy

## 2019-04-10 ENCOUNTER — Other Ambulatory Visit: Payer: Self-pay

## 2019-04-10 DIAGNOSIS — M6281 Muscle weakness (generalized): Secondary | ICD-10-CM

## 2019-04-10 DIAGNOSIS — R2681 Unsteadiness on feet: Secondary | ICD-10-CM

## 2019-04-10 DIAGNOSIS — R293 Abnormal posture: Secondary | ICD-10-CM

## 2019-04-10 DIAGNOSIS — R2689 Other abnormalities of gait and mobility: Secondary | ICD-10-CM | POA: Diagnosis not present

## 2019-04-10 NOTE — Therapy (Signed)
[place] MAIN [facility] SERVICES [address] [place], [place], [address] Phone: [phone]   Fax:  [phone]  Physical Therapy Treatment  Patient Details  Name: Jeffery [name] MRN: [id number] Date of Birth: May 05, 1931 Referring Provider (PT): Dr. Benita Stabile PCP   Encounter Date: 04/10/2019  PT End of Session - 04/11/19 0904    Visit Number  17    Number of Visits  25    Date for PT Re-Evaluation  05/01/19    Authorization Type  goals last assessed 04/03/19    PT Start Time  1347    PT Stop Time  1430    PT Time Calculation (min)  43 min    Equipment Utilized During Treatment  Gait belt    Activity Tolerance  Patient tolerated treatment well;No increased pain    Behavior During Therapy  WFL for tasks assessed/performed       Past Medical History:  Diagnosis Date  . Does use hearing aid   . Empyema lung ([place]) 06/03/2016   MODERATE WBC PRESENT, PREDOMINANTLY PMN, STREP INTERMEDIUS  . Hyperlipidemia   . Pneumonia Nov/Dec 2017    Past Surgical History:  Procedure Laterality Date  . APPENDECTOMY  1971  . COLONOSCOPY  2014  . EYE SURGERY     cataract surgery   . GREEN LIGHT LASER TURP (TRANSURETHRAL RESECTION OF PROSTATE N/A 06/20/2018   Procedure: GREEN LIGHT LASER TURP (TRANSURETHRAL RESECTION OF PROSTATE;  Surgeon: Royston Cowper, MD;  Location: [facility] ORS;  Service: Urology;  Laterality: N/A;  . HERNIA REPAIR Left 2008  . INGUINAL HERNIA REPAIR Right 12/01/2016   Right inguinal hernia repair with medium Ultra Pro mesh.  . INGUINAL HERNIA REPAIR Left 07/12/2018   Procedure: HERNIA REPAIR INGUINAL ADULT;  Surgeon: Jules Husbands, MD;  Location: [facility] ORS;  Service: General;  Laterality: Left;  . INSERTION OF MESH Right 12/01/2016   Procedure: INSERTION OF MESH;  Surgeon: Robert Bellow, MD;  Location: [facility] ORS;  Service: General;  Laterality: Right;  . TONSILLECTOMY  1944    There were no vitals filed for this  visit.  Subjective Assessment - 04/11/19 0859    Subjective  patient reports doing well. He denies any new falls reports no pain;    Pertinent History  85 yo Male s/p prostate surgery in Dec 2019 and had subsequent PT in Jan 2020. PT did not help but was suggested that patient receive outpatient PT for weakness and imbalance. He was scheduled to start PT in April 2020 but then was unable to start due to coronavirus. Patient is now wanting to start PT. He has been having trouble with his memory and was recently started on Aricept. However patient started getting confused, wife suspected UTI, patient went to hospital and was diagnosed with abnormal labs and a-fib. They are waiting to hear from cardiologist and neurologist regarding medication and when to resume aricept. He has had 2 falls in last week; He does take frequent naps and will sleep well at night; He does continue to have urinary leakage due to prostate surgery; In addition, patient is very hard of hearing; He denies any dizziness; He denies any numbness/tingling in feet; He uses a SPC intermittently; he is not consistent with using it due to poor memory; He does enjoy walking the dog multiple times a day but only goes a short distance; He does report having to be careful to not let the dog pull him over or trip  him;    Currently in Pain?  No/denies    Multiple Pain Sites  No              Gait outside: Up/down long ramp with head turns side/side x2 laps with min A for safety and mod Vcs to improve gait speed, increase ankle DF at heel strike and improve positioning avoiding lateral sway/veering; patient able to exhibit improved foot clearance when descending ramp compared to ascending ramp;  Up/down curb x3 reps; with CGA to close supervision. Patient able to exhibit good foot placement and balance control with curb negotiation;  On grass: Forward walking with ball pass side/side x20 feet x3 laps with min VCs to increase gait speed.  Patient has significant difficulty increasing gait speed often stopping to get ball for passing; Attempted backwards walking with ball pass x10 feet with significant difficulty with short shuffled steps with patient often stopping due to imbalance and forgetting task/getting confused; Side stepping over cones #6 x20 feet x1 lap each direction with cues to increase hip flexion when stepping over for bette foot clearance, close supervision required; Weaving around cones #6 x4 laps (20 feet each lap) with CGA and cues to increase step length for reciprocal stepping; patient often has difficulty negotiating obstacles with short shuffled steps due to imbalance; Added difficulty with uneven grass noted  With patient requiring CGA for safety;  Side stepping on grass with ball toss back/forth x20 feet x1 lap each direction; Patient becomes heavily fatigued after 2nd bout with short shuffled steps;  Patient reports significant fatigue at end of session requiring short seated rest break before walking back to gym. He exhibits wide base of support and short shuffled steps with increased fatigue.                   PT Education - 04/11/19 0904    Education Details  balance/HEP    Person(s) Educated  Patient    Methods  Explanation;Verbal cues    Comprehension  Verbalized understanding;Returned demonstration;Verbal cues required;Need further instruction       PT Short Term Goals - 04/03/19 1112      PT SHORT TERM GOAL #1   Title  Patient will be adherent to HEP at least 3x a week to improve functional strength and balance for better safety at home.    Baseline  8/17: not doing exercises;    Time  4    Period  Weeks    Status  Partially Met    Target Date  02/22/19      PT SHORT TERM GOAL #2   Title  Patient will deny any falls over past 2 weeks to demonstrate improved safety awareness at home and work.    Baseline  8/17: no falls    Time  4    Period  Weeks    Status  Achieved     Target Date  02/22/19        PT Long Term Goals - 04/03/19 1113      PT LONG TERM GOAL #1   Title  Patient will increase BLE gross strength to 4+/5 as to improve functional strength for independent gait, increased standing tolerance and increased ADL ability.    Time  4    Period  Weeks    Status  Partially Met    Target Date  05/01/19      PT LONG TERM GOAL #2   Title  Patient will score >20 on mini best test  to indicate low fall risk and exhibit improved general mobility with ADLs.    Baseline  03/05/19: 18/28, improved from 15/28    Time  4    Period  Weeks    Status  Partially Met    Target Date  05/01/19      PT LONG TERM GOAL #3   Title  Patient will be independent in getting up from floor with minimal difficulty to improve fall recovery at home.    Time  4    Period  Weeks    Status  Partially Met    Target Date  05/01/19      PT LONG TERM GOAL #4   Title  Patient will increase 10 meter walk test to >1.61ms as to improve gait speed for better community ambulation and to reduce fall risk.    Baseline  8/17: 1.24 m/s    Time  4    Period  Weeks    Status  Achieved    Target Date  05/01/19            Plan - 04/11/19 04944   Clinical Impression Statement  Patient instructed in advanced balance tasks with gait outside on uneven surfaces. He requires mod VCs for proper gait technique and activity with dual tasks challenge. patient has significant difficulty with changing positions requiring increased cues for proper activity. He was able to negotiate curbs well with minimal difficulty. Patient was heavily fatigued at end of session having difficulty walking back to gym requiring seated rest break. He would benefit from additional skilled PT Intervention to improve strength, balance and gait safety;    Rehab Potential  Fair    PT Frequency  2x / week    PT Duration  4 weeks    PT Treatment/Interventions  Cryotherapy;Moist Heat;Gait training;Stair training;Functional  mobility training;Therapeutic activities;Therapeutic exercise;Balance training;Neuromuscular re-education;Patient/family education;Orthotic Fit/Training;Energy conservation    PT Next Visit Plan  foot clearance, strengthening    PT Home Exercise Plan  no updates today    Consulted and Agree with Plan of Care  Patient       Patient will benefit from skilled therapeutic intervention in order to improve the following deficits and impairments:  Abnormal gait, Decreased activity tolerance, Decreased balance, Decreased endurance, Decreased knowledge of precautions, Decreased mobility, Decreased safety awareness, Decreased strength, Difficulty walking, Postural dysfunction  Visit Diagnosis: Other abnormalities of gait and mobility  Muscle weakness (generalized)  Unsteadiness on feet  Abnormal posture     Problem List Patient Active Problem List   Diagnosis Date Noted  . AMS (altered mental status) 01/18/2019  . Altered mental status 01/17/2019  . Incarcerated inguinal hernia 07/12/2018  . Right inguinal hernia 11/25/2016  . [place]   . Pleural effusion on right   . Pleural effusion, bacterial   . [place]yema ([place] 06/17/2016  . Impingement syndrome of shoulder region 02/25/2016  . Shoulder pain 02/25/2016  . Subacromial impingement 02/25/2016  . Mixed hyperlipidemia 04/01/2015  . Murmur, cardiac 04/01/2015    Alizzon Dioguardi PT, DPT 04/11/2019, 9:07 AM  [place] [facility] [address] [address]Phone: [phone]  Fax:  [phone] Name: Jeffery MOUNTSMRN: [id number] of Birth: 304-22-1932

## 2019-04-11 ENCOUNTER — Encounter: Payer: Self-pay | Admitting: Physical Therapy

## 2019-04-12 ENCOUNTER — Other Ambulatory Visit: Payer: Self-pay

## 2019-04-12 ENCOUNTER — Encounter: Payer: Self-pay | Admitting: Physical Therapy

## 2019-04-12 ENCOUNTER — Ambulatory Visit: Payer: Medicare Other | Admitting: Physical Therapy

## 2019-04-12 DIAGNOSIS — R293 Abnormal posture: Secondary | ICD-10-CM

## 2019-04-12 DIAGNOSIS — R2689 Other abnormalities of gait and mobility: Secondary | ICD-10-CM | POA: Diagnosis not present

## 2019-04-12 DIAGNOSIS — R2681 Unsteadiness on feet: Secondary | ICD-10-CM

## 2019-04-12 DIAGNOSIS — M6281 Muscle weakness (generalized): Secondary | ICD-10-CM

## 2019-04-12 NOTE — Therapy (Signed)
[place] MAIN [facility] SERVICES [address] [place], [place], [address] Phone: [phone]   Fax:  [phone]  Physical Therapy Treatment  Patient Details  Name: Jeffery [name] MRN: [id number] Date of Birth: [birth year] Referring Provider (PT): Dr. Benita Stabile PCP   Encounter Date: 04/12/2019  PT End of Session - 04/12/19 1521    Visit Number  18    Number of Visits  25    Date for PT Re-Evaluation  05/01/19    Authorization Type  goals last assessed 04/03/19    PT Start Time  1431    PT Stop Time  1515    PT Time Calculation (min)  44 min    Equipment Utilized During Treatment  Gait belt    Activity Tolerance  Patient tolerated treatment well;No increased pain    Behavior During Therapy  WFL for tasks assessed/performed       Past Medical History:  Diagnosis Date  . Does use hearing aid   . Empyema lung ([place]) 06/03/2016   MODERATE WBC PRESENT, PREDOMINANTLY PMN, STREP INTERMEDIUS  . Hyperlipidemia   . Pneumonia Nov/Dec 2017    Past Surgical History:  Procedure Laterality Date  . APPENDECTOMY  1971  . COLONOSCOPY  2014  . EYE SURGERY     cataract surgery   . GREEN LIGHT LASER TURP (TRANSURETHRAL RESECTION OF PROSTATE N/A 06/20/2018   Procedure: GREEN LIGHT LASER TURP (TRANSURETHRAL RESECTION OF PROSTATE;  Surgeon: Royston Cowper, MD;  Location: [facility]S;  Service: Urology;  Laterality: N/A;  . HERNIA REPAIR Left 2008  . INGUINAL HERNIA REPAIR Right 12/01/2016   Right inguinal hernia repair with medium Ultra Pro mesh.  . INGUINAL HERNIA REPAIR Left 07/12/2018   Procedure: HERNIA REPAIR INGUINAL ADULT;  Surgeon: Jules Husbands, MD;  Location: [facility] ORS;  Service: General;  Laterality: Left;  . INSERTION OF MESH Right 12/01/2016   Procedure: INSERTION OF MESH;  Surgeon: Robert Bellow, MD;  Location: [facility] ORS;  Service: General;  Laterality: Right;  . TONSILLECTOMY  1944    There were no vitals filed for this  visit.  Subjective Assessment - 04/12/19 1436    Subjective  Patient reports doing well; Denies any pain;    Pertinent History  85 yo Male s/p prostate surgery in Dec 2019 and had subsequent PT in Jan 2020. PT did not help but was suggested that patient receive outpatient PT for weakness and imbalance. He was scheduled to start PT in April 2020 but then was unable to start due to coronavirus. Patient is now wanting to start PT. He has been having trouble with his memory and was recently started on Aricept. However patient started getting confused, wife suspected UTI, patient went to hospital and was diagnosed with abnormal labs and a-fib. They are waiting to hear from cardiologist and neurologist regarding medication and when to resume aricept. He has had 2 falls in last week; He does take frequent naps and will sleep well at night; He does continue to have urinary leakage due to prostate surgery; In addition, patient is very hard of hearing; He denies any dizziness; He denies any numbness/tingling in feet; He uses a SPC intermittently; he is not consistent with using it due to poor memory; He does enjoy walking the dog multiple times a day but only goes a short distance; He does report having to be careful to not let the dog pull him over or trip him;    Currently  in Pain?  No/denies    Multiple Pain Sites  No              TREATMENT: Exercise: Leg press: BLE 115# 2x12 with min VCS for proper positioning for optimal strengthening;  Seated with green tband around BLE toes: Ankle DF 2x20 with min VCs for proper positioning including to slow down LE movement for optimal strengthening;   Standing with green tband around BLE: -diagonal stepping forward/backward x10 feet x2 laps each direction with mod VCs for sequencing and proper foot placement. Patient has significant difficulty stepping backwards due to weakness and imbalance;   NMR:  Instructed patient in gait in hallway: -forward  walking with lateral head turns calling out cards (red or black) x2 laps each with cues to increase gait speed; patient able to exhibit improved foot clearance but often slows down gait speed; Patient initially was 60% accurate but then able to get to 100% accuracy with slower gait speed;  -forward/backward walking with ball toss up/down x80 feet x2 laps with significant slower speed and shorter step length when walking backwards. Patient required cues to increase step length and was able to self correct with instruction;  -Forward walking while bouncing basketball x80 feet x2 laps with CGA for safety as patient often lunges forward to try and catch ball. Attempted walking and dribbling basketball but patient unable due to less air in basketball and difficulty with dual task.   Resisted walking, 17.5# forward/backward, side/side x3 laps each (2 way) with min A for safety and cues to slow down eccentric return for better strengthening;   Side stepping over cones #6 with min A x1 rep each direction with mod VCs to increase hip flexion for foot clearance;  Weaving around cones #6 close together x4 laps with CGA to close supervision and cues to increase step length and to stay oriented to task. Patient would often knock over a cone and then would forget instruction and start walking beside cones rather than weaving around them.    Response to treatment: Patient very hard of hearing requiring increased instruction and demonstration for better activity technique; He fatigues with advanced LE strengthening, requiring intermittent seated rest breaks. Patient also has significant difficulty with gait with dual task, often slowing down speed and shuffling feet;                          PT Education - 04/12/19 1521    Education Details  balance/strengthening; HEP    Person(s) Educated  Patient    Methods  Explanation;Verbal cues    Comprehension  Verbalized understanding;Returned  demonstration;Verbal cues required;Need further instruction       PT Short Term Goals - 04/03/19 1112      PT SHORT TERM GOAL #1   Title  Patient will be adherent to HEP at least 3x a week to improve functional strength and balance for better safety at home.    Baseline  8/17: not doing exercises;    Time  4    Period  Weeks    Status  Partially Met    Target Date  02/22/19      PT SHORT TERM GOAL #2   Title  Patient will deny any falls over past 2 weeks to demonstrate improved safety awareness at home and work.    Baseline  8/17: no falls    Time  4    Period  Weeks    Status  Achieved  Target Date  02/22/19        PT Long Term Goals - 04/03/19 1113      PT LONG TERM GOAL #1   Title  Patient will increase BLE gross strength to 4+/5 as to improve functional strength for independent gait, increased standing tolerance and increased ADL ability.    Time  4    Period  Weeks    Status  Partially Met    Target Date  05/01/19      PT LONG TERM GOAL #2   Title  Patient will score >20 on mini best test to indicate low fall risk and exhibit improved general mobility with ADLs.    Baseline  03/05/19: 18/28, improved from 15/28    Time  4    Period  Weeks    Status  Partially Met    Target Date  05/01/19      PT LONG TERM GOAL #3   Title  Patient will be independent in getting up from floor with minimal difficulty to improve fall recovery at home.    Time  4    Period  Weeks    Status  Partially Met    Target Date  05/01/19      PT LONG TERM GOAL #4   Title  Patient will increase 10 meter walk test to >1.6ms as to improve gait speed for better community ambulation and to reduce fall risk.    Baseline  8/17: 1.24 m/s    Time  4    Period  Weeks    Status  Achieved    Target Date  05/01/19            Plan - 04/12/19 1521    Clinical Impression Statement  Patient motivated and participated well within session. Instructed patient in advanced LE strengthening;  patient requires min VCs for proper positioning and exercise technique for optimal strengthening. Continued to challenge dynamic balance with dual task activities. Patient has significant difficulty with gait tasks when challenged with dual task often shuffling feet and slowing down gait speed. He would benefit from additional skilled PT intervention to improve strength, balance and gait safety;    Rehab Potential  Fair    PT Frequency  2x / week    PT Duration  4 weeks    PT Treatment/Interventions  Cryotherapy;Moist Heat;Gait training;Stair training;Functional mobility training;Therapeutic activities;Therapeutic exercise;Balance training;Neuromuscular re-education;Patient/family education;Orthotic Fit/Training;Energy conservation    PT Next Visit Plan  foot clearance, strengthening    PT Home Exercise Plan  no updates today    Consulted and Agree with Plan of Care  Patient       Patient will benefit from skilled therapeutic intervention in order to improve the following deficits and impairments:  Abnormal gait, Decreased activity tolerance, Decreased balance, Decreased endurance, Decreased knowledge of precautions, Decreased mobility, Decreased safety awareness, Decreased strength, Difficulty walking, Postural dysfunction  Visit Diagnosis: Other abnormalities of gait and mobility  Muscle weakness (generalized)  Unsteadiness on feet  Abnormal posture     Problem List Patient Active Problem List   Diagnosis Date Noted  . AMS (altered mental status) 01/18/2019  . Altered mental status 01/17/2019  . Incarcerated inguinal hernia 07/12/2018  . Right inguinal hernia 11/25/2016  . [place]   . Pleural effusion on right   . Pleural effusion, bacterial   . [place] ([place] 06/17[place]2016  . Impingement syndrome of shoulder region 02/25/2016  . Shoulder pain 02/25/2016  . Subacromial impingement 02/25/2016  .  Mixed hyperlipidemia 04/01/2015  . Murmur, cardiac 04/01/2015     Charlesia Canaday PT, DPT 04/12/2019, 3:24 PM  [place] MAIN [facility] SERVICES [address] [place], [place], [address] Phone: [phone]   Fax:  [phone]  Name: TERREZ ANDER MRN: [id number] Date of Birth: [birth year]

## 2019-04-17 ENCOUNTER — Encounter: Payer: Self-pay | Admitting: Physical Therapy

## 2019-04-17 ENCOUNTER — Other Ambulatory Visit: Payer: Self-pay

## 2019-04-17 ENCOUNTER — Ambulatory Visit: Payer: Medicare Other | Admitting: Physical Therapy

## 2019-04-17 DIAGNOSIS — M6281 Muscle weakness (generalized): Secondary | ICD-10-CM

## 2019-04-17 DIAGNOSIS — R2689 Other abnormalities of gait and mobility: Secondary | ICD-10-CM | POA: Diagnosis not present

## 2019-04-17 DIAGNOSIS — R2681 Unsteadiness on feet: Secondary | ICD-10-CM

## 2019-04-17 NOTE — Therapy (Signed)
[place] MAIN [facility] SERVICES [address] [place], [place], [address] Phone: [phone]   Fax:  [phone]  Physical Therapy Treatment  Patient Details  Name: Jeffery [name] MRN: [id number] Date of Birth: 21-Dec-1930 Referring Provider (PT): Dr. Benita Stabile PCP   Encounter Date: 04/17/2019  PT End of Session - 04/17/19 1322    Visit Number  19    Number of Visits  25    Date for PT Re-Evaluation  05/01/19    Authorization Type  goals last assessed 04/03/19    PT Start Time  1146    PT Stop Time  1233    PT Time Calculation (min)  47 min    Equipment Utilized During Treatment  Gait belt    Activity Tolerance  Patient tolerated treatment well;No increased pain    Behavior During Therapy  WFL for tasks assessed/performed       Past Medical History:  Diagnosis Date  . Does use hearing aid   . Empyema lung ([place]) 06/03/2016   MODERATE WBC PRESENT, PREDOMINANTLY PMN, STREP INTERMEDIUS  . Hyperlipidemia   . Pneumonia Nov/Dec 2017    Past Surgical History:  Procedure Laterality Date  . APPENDECTOMY  1971  . COLONOSCOPY  2014  . EYE SURGERY     cataract surgery   . GREEN LIGHT LASER TURP (TRANSURETHRAL RESECTION OF PROSTATE N/A 06/20/2018   Procedure: GREEN LIGHT LASER TURP (TRANSURETHRAL RESECTION OF PROSTATE;  Surgeon: Royston Cowper, MD;  Location: [facility] ORS;  Service: Urology;  Laterality: N/A;  . HERNIA REPAIR Left 2008  . INGUINAL HERNIA REPAIR Right 12/01/2016   Right inguinal hernia repair with medium Ultra Pro mesh.  . INGUINAL HERNIA REPAIR Left 07/12/2018   Procedure: HERNIA REPAIR INGUINAL ADULT;  Surgeon: Jules Husbands, MD;  Location: [facility] ORS;  Service: General;  Laterality: Left;  . INSERTION OF MESH Right 12/01/2016   Procedure: INSERTION OF MESH;  Surgeon: Robert Bellow, MD;  Location: [facility] ORS;  Service: General;  Laterality: Right;  . TONSILLECTOMY  1944    There were no vitals filed for this  visit.  Subjective Assessment - 04/17/19 1320    Subjective  Patient is doing well todayl; reports slight pain in his legs, does not remember why.    Pertinent History  85 yo Male s/p prostate surgery in Dec 2019 and had subsequent PT in Jan 2020. PT did not help but was suggested that patient receive outpatient PT for weakness and imbalance. He was scheduled to start PT in April 2020 but then was unable to start due to coronavirus. Patient is now wanting to start PT. He has been having trouble with his memory and was recently started on Aricept. However patient started getting confused, wife suspected UTI, patient went to hospital and was diagnosed with abnormal labs and a-fib. They are waiting to hear from cardiologist and neurologist regarding medication and when to resume aricept. He has had 2 falls in last week; He does take frequent naps and will sleep well at night; He does continue to have urinary leakage due to prostate surgery; In addition, patient is very hard of hearing; He denies any dizziness; He denies any numbness/tingling in feet; He uses a SPC intermittently; he is not consistent with using it due to poor memory; He does enjoy walking the dog multiple times a day but only goes a short distance; He does report having to be careful to not let the dog pull him  over or trip him;    Currently in Pain?  Yes    Pain Score  2     Pain Location  Leg    Pain Orientation  Left;Right    Pain Descriptors / Indicators  Aching    Pain Type  Acute pain    Pain Onset  Today    Pain Frequency  Intermittent    Multiple Pain Sites  No        TREATMENT: Exercise: Leg press: BLE 120# 2x12 with min VCS for proper positioning for optimal strengthening;   Seated with green tband around BLE toes: Ankle DF 2x20 with min VCs for proper positioning including to slow down LE movement for optimal strengthening;     NMR:  Instructed patient in gait in hallway: -forward walking with lateral head turns  calling out cards (red or black) x80 feet x2 laps each with cues to increase gait speed; patient able to exhibit improved foot clearance but often slows down gait speed; Patient initially was 75% accurate but then able to get to 100% accuracy with slower gait speed;  -forward/backward walking with ball toss up/down x80 feet x2 laps with significant slower speed and shorter step length when walking backwards. Patient required cues to increase step length and was able to self correct with instruction;   -Forward walking while bouncing rainbow ball x80 feet x3 laps with CGA for safety as patient often lunges forward to try and catch ball.  -side stepping with ball toss against wall x10 feet x5 lap each direction with mod VCs to increase foot clearance; patient unable to perform both actions simultaneously but has improved performance after cues to slow down.    Side stepping over cones  #6 with min A x1 rep each direction with mod VCs to increase hip flexion for foot clearance; increased difficulty side stepping to the right, patient would step over cone with RLE but shuffle step his LLE around the cone instead.  Weaving around cones #6 close together x4 laps with CGA to close supervision and cues to increase step length and to stay oriented to task. Patient would often knock over a cone and then would forget instruction and start walking beside or over cones rather than weaving around them.      Response to treatment: Patient very hard of hearing requiring increased instruction and demonstration for better activity technique; He fatigues with advanced LE strengthening, requiring intermittent seated rest breaks. Patient also has significant difficulty with gait with dual task, often slowing down speed and shuffling feet; Side stepping to the right is more difficult for patient, especially with stepping over obstacles, causing patient to resort to compensations instead of completing step-over with LLE.        PT Education - 04/17/19 1322    Education Details  balance/strengthening; HEP    Person(s) Educated  Patient    Methods  Explanation;Verbal cues    Comprehension  Verbalized understanding;Returned demonstration;Verbal cues required;Need further instruction       PT Short Term Goals - 04/03/19 1112      PT SHORT TERM GOAL #1   Title  Patient will be adherent to HEP at least 3x a week to improve functional strength and balance for better safety at home.    Baseline  8/17: not doing exercises;    Time  4    Period  Weeks    Status  Partially Met    Target Date  02/22/19  PT SHORT TERM GOAL #2   Title  Patient will deny any falls over past 2 weeks to demonstrate improved safety awareness at home and work.    Baseline  8/17: no falls    Time  4    Period  Weeks    Status  Achieved    Target Date  02/22/19        PT Long Term Goals - 04/03/19 1113      PT LONG TERM GOAL #1   Title  Patient will increase BLE gross strength to 4+/5 as to improve functional strength for independent gait, increased standing tolerance and increased ADL ability.    Time  4    Period  Weeks    Status  Partially Met    Target Date  05/01/19      PT LONG TERM GOAL #2   Title  Patient will score >20 on mini best test to indicate low fall risk and exhibit improved general mobility with ADLs.    Baseline  03/05/19: 18/28, improved from 15/28    Time  4    Period  Weeks    Status  Partially Met    Target Date  05/01/19      PT LONG TERM GOAL #3   Title  Patient will be independent in getting up from floor with minimal difficulty to improve fall recovery at home.    Time  4    Period  Weeks    Status  Partially Met    Target Date  05/01/19      PT LONG TERM GOAL #4   Title  Patient will increase 10 meter walk test to >1.73ms as to improve gait speed for better community ambulation and to reduce fall risk.    Baseline  8/17: 1.24 m/s    Time  4    Period  Weeks    Status  Achieved     Target Date  05/01/19            Plan - 04/17/19 1323    Clinical Impression Statement  Patient motivated and participated well within session. Dynamic balance continues to be challenged with dual task activities, requiring mod VC to maintain consistent step length and gait speed. Responds well to demonstration of activity for accurate reproduction. No increase in pain with today's acivities. Patient would benefit from continued skilled PT intervention to improve strength, balance and gait safety.    Rehab Potential  Fair    PT Frequency  2x / week    PT Duration  4 weeks    PT Treatment/Interventions  Cryotherapy;Moist Heat;Gait training;Stair training;Functional mobility training;Therapeutic activities;Therapeutic exercise;Balance training;Neuromuscular re-education;Patient/family education;Orthotic Fit/Training;Energy conservation    PT Next Visit Plan  foot clearance, strengthening    PT Home Exercise Plan  no updates today    Consulted and Agree with Plan of Care  Patient       Patient will benefit from skilled therapeutic intervention in order to improve the following deficits and impairments:  Abnormal gait, Decreased activity tolerance, Decreased balance, Decreased endurance, Decreased knowledge of precautions, Decreased mobility, Decreased safety awareness, Decreased strength, Difficulty walking, Postural dysfunction  Visit Diagnosis: Other abnormalities of gait and mobility  Muscle weakness (generalized)  Unsteadiness on feet     Problem List Patient Active Problem List   Diagnosis Date Noted  . AMS (altered mental status) 01/18/2019  . Altered mental status 01/17/2019  . Incarcerated inguinal hernia 07/12/2018  . Right inguinal hernia 11/25/2016  .  Empyema lung ([place])   . Pleural effusion on right   . Pleural effusion, bacterial   . Empyema ([place]) 06/17/2016  . Impingement syndrome of shoulder region 02/25/2016  . Shoulder pain 02/25/2016  . Subacromial  impingement 02/25/2016  . Mixed hyperlipidemia 04/01/2015  . Murmur, cardiac 04/01/2015   Gerda Diss. Rosana Hoes, SPT This entire session was performed under direct supervision and direction of a licensed therapist/therapist assistant . I have personally read, edited and approve of the note as written.  Trotter,Margaret PT, DPT 04/17/2019, 2:52 PM  [place] MAIN [facility] SERVICES [address] [place], [place], [address] Phone: [phone]   Fax:  [phone]  Name: AMY BELLOSO MRN: [id number] Date of Birth: 03/24/1931

## 2019-04-19 ENCOUNTER — Other Ambulatory Visit: Payer: Self-pay

## 2019-04-19 ENCOUNTER — Encounter: Payer: Self-pay | Admitting: Physical Therapy

## 2019-04-19 ENCOUNTER — Ambulatory Visit: Payer: Medicare Other | Attending: Internal Medicine | Admitting: Physical Therapy

## 2019-04-19 DIAGNOSIS — R2689 Other abnormalities of gait and mobility: Secondary | ICD-10-CM | POA: Insufficient documentation

## 2019-04-19 DIAGNOSIS — R293 Abnormal posture: Secondary | ICD-10-CM | POA: Insufficient documentation

## 2019-04-19 DIAGNOSIS — R2681 Unsteadiness on feet: Secondary | ICD-10-CM | POA: Diagnosis present

## 2019-04-19 DIAGNOSIS — M6281 Muscle weakness (generalized): Secondary | ICD-10-CM | POA: Insufficient documentation

## 2019-04-19 NOTE — Therapy (Signed)
[place] MAIN [facility] SERVICES [address] [place], [place], [address] Phone: [phone]   Fax:  [phone]  Physical Therapy Treatment Physical Therapy Progress Note   Dates of reporting period  03/05/19   to   04/19/19   Patient Details  Name: Jeffery [name] MRN: [id number] Date of Birth: [birth year] Referring Provider (PT): Dr. Benita Stabile PCP   Encounter Date: 04/19/2019  PT End of Session - 04/19/19 1518    Visit Number  20    Number of Visits  25    Date for PT Re-Evaluation  05/01/19    Authorization Type  goals last assessed 04/03/19    PT Start Time  1431    PT Stop Time  1515    PT Time Calculation (min)  44 min    Equipment Utilized During Treatment  Gait belt    Activity Tolerance  Patient tolerated treatment well;No increased pain    Behavior During Therapy  WFL for tasks assessed/performed       Past Medical History:  Diagnosis Date  . Does use hearing aid   . Empyema lung ([place]) 06/03/2016   MODERATE WBC PRESENT, PREDOMINANTLY PMN, STREP INTERMEDIUS  . Hyperlipidemia   . Pneumonia Nov/Dec 2017    Past Surgical History:  Procedure Laterality Date  . APPENDECTOMY  1971  . COLONOSCOPY  2014  . EYE SURGERY     cataract surgery   . GREEN LIGHT LASER TURP (TRANSURETHRAL RESECTION OF PROSTATE N/A 06/20/2018   Procedure: GREEN LIGHT LASER TURP (TRANSURETHRAL RESECTION OF PROSTATE;  Surgeon: Royston Cowper, MD;  Location: [facility] ORS;  Service: Urology;  Laterality: N/A;  . HERNIA REPAIR Left 2008  . INGUINAL HERNIA REPAIR Right 12/01/2016   Right inguinal hernia repair with medium Ultra Pro mesh.  . INGUINAL HERNIA REPAIR Left 07/12/2018   Procedure: HERNIA REPAIR INGUINAL ADULT;  Surgeon: Jules Husbands, MD;  Location: [facility] ORS;  Service: General;  Laterality: Left;  . INSERTION OF MESH Right 12/01/2016   Procedure: INSERTION OF MESH;  Surgeon: Robert Bellow, MD;  Location: [facility] ORS;  Service: General;  Laterality:  Right;  . TONSILLECTOMY  1944    There were no vitals filed for this visit.  Subjective Assessment - 04/19/19 1517    Subjective  Patient reports doing well; He denies any pain; denies any new falls; He reports having an interview at [facility] and is planning on moving there in the future.    Pertinent History  85 yo Male s/p prostate surgery in Dec 2019 and had subsequent PT in Jan 2020. PT did not help but was suggested that patient receive outpatient PT for weakness and imbalance. He was scheduled to start PT in April 2020 but then was unable to start due to coronavirus. Patient is now wanting to start PT. He has been having trouble with his memory and was recently started on Aricept. However patient started getting confused, wife suspected UTI, patient went to hospital and was diagnosed with abnormal labs and a-fib. They are waiting to hear from cardiologist and neurologist regarding medication and when to resume aricept. He has had 2 falls in last week; He does take frequent naps and will sleep well at night; He does continue to have urinary leakage due to prostate surgery; In addition, patient is very hard of hearing; He denies any dizziness; He denies any numbness/tingling in feet; He uses a SPC intermittently; he is not consistent with using it due  to poor memory; He does enjoy walking the dog multiple times a day but only goes a short distance; He does report having to be careful to not let the dog pull him over or trip him;    Currently in Pain?  No/denies    Pain Onset  Today    Multiple Pain Sites  No         Gait outside: Up/down long ramp with head turns side/side x2 laps with min A for safety and mod Vcs to improve gait speed, increase ankle DF at heel strike and improve positioning avoiding lateral sway/veering; patient able to exhibit improved foot clearance when descending ramp compared to ascending ramp;   Up/down curb x5 reps; with CGA to close supervision. Required cues to  step closer to curb prior to ascend/descend and to continue walking when ascend/descend for better dynamic balance/stability; He was able to exhibit improved curb negotiation with increased repetition;   On grass: Forward/backward walking with ball pass side/side x20 feet x2 laps with min VCs to increase gait speed. Patient has significant difficulty increasing gait speed often stopping to get ball for passing;  Side stepping over cones #6 x20 feet x1 lap each direction with cues to increase hip flexion when stepping over for bette foot clearance, close supervision required;  Weaving around cones #6 x4 laps (20 feet each lap) with CGA and cues to increase step length for reciprocal stepping; patient often has difficulty negotiating obstacles with short shuffled steps due to imbalance; Added difficulty with uneven grass noted  With patient requiring CGA for safety;  Side stepping on grass with ball toss back/forth x20 feet x1 lap each direction; Required cues to increase step length and continue walking with ball toss for better dual task ability;   Standing on concrete walkway: Forward walking with ball bounce and catch x30 feet x1 rep with CGA for safety; patient able to continue walking without stopping with better dual task ability;   Standing on concrete: -alternate toe taps on 6 inch step x10 reps unsupported with CGA for safety, required cues to step back and avoid marching forward to avoid catching toe on step;   Patient reports increased fatigue at end of session; However he was able to exhibit better gait safety and improved motor planning with dual tasks this session with less stopping gait pattern and better mobility; He does continue to require cues and demonstration for proper exercise technique; Patient's condition has the potential to improve in response to therapy. Maximum improvement is yet to be obtained. The anticipated improvement is attainable and reasonable in a generally  predictable time.  Patient reports adherence with HEP and states that he is doing better at home. Goals recently assessed on 04/03/19, please refer to recent progress note for updated goals.                                      PT Education - 04/19/19 1518    Education Details  balance/gait safety; HEP    Person(s) Educated  Patient    Methods  Explanation;Verbal cues    Comprehension  Verbalized understanding;Returned demonstration;Verbal cues required;Need further instruction       PT Short Term Goals - 04/03/19 1112      PT SHORT TERM GOAL #1   Title  Patient will be adherent to HEP at least 3x a week to improve functional strength and balance for  better safety at home.    Baseline  8/17: not doing exercises;    Time  4    Period  Weeks    Status  Partially Met    Target Date  02/22/19      PT SHORT TERM GOAL #2   Title  Patient will deny any falls over past 2 weeks to demonstrate improved safety awareness at home and work.    Baseline  8/17: no falls    Time  4    Period  Weeks    Status  Achieved    Target Date  02/22/19        PT Long Term Goals - 04/03/19 1113      PT LONG TERM GOAL #1   Title  Patient will increase BLE gross strength to 4+/5 as to improve functional strength for independent gait, increased standing tolerance and increased ADL ability.    Time  4    Period  Weeks    Status  Partially Met    Target Date  05/01/19      PT LONG TERM GOAL #2   Title  Patient will score >20 on mini best test to indicate low fall risk and exhibit improved general mobility with ADLs.    Baseline  03/05/19: 18/28, improved from 15/28    Time  4    Period  Weeks    Status  Partially Met    Target Date  05/01/19      PT LONG TERM GOAL #3   Title  Patient will be independent in getting up from floor with minimal difficulty to improve fall recovery at home.    Time  4    Period  Weeks    Status  Partially Met    Target Date  05/01/19      PT  LONG TERM GOAL #4   Title  Patient will increase 10 meter walk test to >1.2ms as to improve gait speed for better community ambulation and to reduce fall risk.    Baseline  8/17: 1.24 m/s    Time  4    Period  Weeks    Status  Achieved    Target Date  05/01/19            Plan - 04/19/19 1519    Clinical Impression Statement  Patient motivated and participated well within session. Instructed patient in advanced balance/gait tasks outside on uneven surfaces. He continues to require cues to improve foot clearance and increase step length for better reciprocal gait pattern. However patient does exhibit improved balance and reports less fatigue this session compared to 2 weeks ago. In addition he was able to complete dual tasks better this session requiring fewer verbal cues. he would benefit from additional skilled PT intervention to improve strength, balance and mobility;    Rehab Potential  Fair    PT Frequency  2x / week    PT Duration  4 weeks    PT Treatment/Interventions  Cryotherapy;Moist Heat;Gait training;Stair training;Functional mobility training;Therapeutic activities;Therapeutic exercise;Balance training;Neuromuscular re-education;Patient/family education;Orthotic Fit/Training;Energy conservation    PT Next Visit Plan  foot clearance, strengthening    PT Home Exercise Plan  no updates today    Consulted and Agree with Plan of Care  Patient       Patient will benefit from skilled therapeutic intervention in order to improve the following deficits and impairments:  Abnormal gait, Decreased activity tolerance, Decreased balance, Decreased endurance, Decreased knowledge of precautions, Decreased mobility, Decreased  safety awareness, Decreased strength, Difficulty walking, Postural dysfunction  Visit Diagnosis: Other abnormalities of gait and mobility  Muscle weakness (generalized)  Unsteadiness on feet  Abnormal posture     Problem List Patient Active Problem List    Diagnosis Date Noted  . AMS (altered mental status) 01/18/2019  . Altered mental status 01/17/2019  . Incarcerated inguinal hernia 07/12/2018  . Right inguinal hernia 11/25/2016  . Empyema lung ([place])   . Pleural effusion on right   . Pleural effusion, bacterial   . Empyema ([place]) 06/17/2016  . Impingement syndrome of shoulder region 02/25/2016  . Shoulder pain 02/25/2016  . Subacromial impingement 02/25/2016  . Mixed hyperlipidemia 04/01/2015  . Murmur, cardiac 04/01/2015    Kimaya Whitlatch PT, DPT 04/19/2019, 3:21 PM  [place] MAIN [facility] SERVICES [address] [place], [place], [address] Phone: [phone]   Fax:  [phone]  Name: LANDAN FEDIE MRN: [id number] Date of Birth: 1931-07-03

## 2019-04-23 ENCOUNTER — Encounter: Payer: Self-pay | Admitting: Physical Therapy

## 2019-04-23 ENCOUNTER — Other Ambulatory Visit: Payer: Self-pay

## 2019-04-23 ENCOUNTER — Ambulatory Visit: Payer: Medicare Other | Admitting: Physical Therapy

## 2019-04-23 DIAGNOSIS — R2689 Other abnormalities of gait and mobility: Secondary | ICD-10-CM

## 2019-04-23 DIAGNOSIS — R293 Abnormal posture: Secondary | ICD-10-CM

## 2019-04-23 DIAGNOSIS — R2681 Unsteadiness on feet: Secondary | ICD-10-CM

## 2019-04-23 DIAGNOSIS — M6281 Muscle weakness (generalized): Secondary | ICD-10-CM

## 2019-04-23 NOTE — Therapy (Signed)
[place] MAIN [facility] SERVICES [address] [place], [place], [address] Phone: [phone]   Fax:  [phone]  Physical Therapy Treatment  Patient Details  Name: Jeffery [name] MRN: [id number] Date of Birth: Aug 02, 1930 Referring Provider (PT): Dr. Benita Stabile PCP   Encounter Date: 04/23/2019  PT End of Session - 04/23/19 1323    Visit Number  21    Number of Visits  25    Date for PT Re-Evaluation  05/01/19    Authorization Type  goals last assessed 04/03/19    PT Start Time  1301    PT Stop Time  1345    PT Time Calculation (min)  44 min    Equipment Utilized During Treatment  Gait belt    Activity Tolerance  Patient tolerated treatment well;No increased pain    Behavior During Therapy  WFL for tasks assessed/performed       Past Medical History:  Diagnosis Date  . Does use hearing aid   . Empyema lung ([place]) 06/03/2016   MODERATE WBC PRESENT, PREDOMINANTLY PMN, STREP INTERMEDIUS  . Hyperlipidemia   . Pneumonia Nov/Dec 2017    Past Surgical History:  Procedure Laterality Date  . APPENDECTOMY  1971  . COLONOSCOPY  2014  . EYE SURGERY     cataract surgery   . GREEN LIGHT LASER TURP (TRANSURETHRAL RESECTION OF PROSTATE N/A 06/20/2018   Procedure: GREEN LIGHT LASER TURP (TRANSURETHRAL RESECTION OF PROSTATE;  Surgeon: Royston Cowper, MD;  Location: [facility] ORS;  Service: Urology;  Laterality: N/A;  . HERNIA REPAIR Left 2008  . INGUINAL HERNIA REPAIR Right 12/01/2016   Right inguinal hernia repair with medium Ultra Pro mesh.  . INGUINAL HERNIA REPAIR Left 07/12/2018   Procedure: HERNIA REPAIR INGUINAL ADULT;  Surgeon: Jules Husbands, MD;  Location: [facility] ORS;  Service: General;  Laterality: Left;  . INSERTION OF MESH Right 12/01/2016   Procedure: INSERTION OF MESH;  Surgeon: Robert Bellow, MD;  Location: [facility] ORS;  Service: General;  Laterality: Right;  . TONSILLECTOMY  1944    There were no vitals filed for this  visit.  Subjective Assessment - 04/23/19 1322    Subjective  Patient reports increased fatigue today; Denies any pain;    Pertinent History  85 yo Male s/p prostate surgery in Dec 2019 and had subsequent PT in Jan 2020. PT did not help but was suggested that patient receive outpatient PT for weakness and imbalance. He was scheduled to start PT in April 2020 but then was unable to start due to coronavirus. Patient is now wanting to start PT. He has been having trouble with his memory and was recently started on Aricept. However patient started getting confused, wife suspected UTI, patient went to hospital and was diagnosed with abnormal labs and a-fib. They are waiting to hear from cardiologist and neurologist regarding medication and when to resume aricept. He has had 2 falls in last week; He does take frequent naps and will sleep well at night; He does continue to have urinary leakage due to prostate surgery; In addition, patient is very hard of hearing; He denies any dizziness; He denies any numbness/tingling in feet; He uses a SPC intermittently; he is not consistent with using it due to poor memory; He does enjoy walking the dog multiple times a day but only goes a short distance; He does report having to be careful to not let the dog pull him over or trip him;  Currently in Pain?  No/denies    Pain Onset  Today         TREATMENT: NMR: Gait in hallway: -forward walking with head turns calling out cards red/black to challenge cognition with forward walking x4 laps with cues to increase speed, increase ankle DF at heel strike for better foot clearance. Patient initially required 1 min to walk 1 lap with heavy foot drag, by 4th lap patient able to complete in 35 sec with improved foot clearance; close supervision for safety;  Forward walking dribbling basketball x40 feet x2 Side stepping bouncing basketball x 20 feet x1 set each direction; patient exhibits heavy foot drag requiring cues to  increase foot clearance with dual task challenge;  Instructed patient in dynamic balance exercise: Resisted walking, 7.5# forward/backward, side/side x2 way, x2 laps each direction stepping over orange hurdle and white 1/2 bolster; required min A for safety and cues to improve weight shift especially with eccentric control for better balance control.  Ladder drills: Forward reciprocal gait x4 laps Out-out-in-in x2 laps with min VCs for sequencing and positioning;  Patient required mod VCs to increase step length to avoid stepping on rungs; He also required cues for proper sequencing and coordination with advanced ladder drills. Patient had significant difficulty with alternating LE movement out-out-in-in due to confusion and poor motor planning;    Exercise:  Leg press: BLE plate 135# [id number] with min VCs for proper positioning and exercise technique; BLE plate 135# heel raises x12 with cues for proper positioning and sequencing to isolate calf strengthening;     Response to treatment: Tolerated fair; patient reports increased fatigue at end of session; He was more confused this session requiring increased cues for proper motor planning and sequencing with advanced tasks. Patient exhibits increased foot drag this session compared to previous sessions;                  PT Education - 04/23/19 1322    Education Details  balance/gait safety; Strengthening    Person(s) Educated  Patient    Methods  Explanation;Verbal cues    Comprehension  Verbalized understanding;Returned demonstration;Verbal cues required;Need further instruction       PT Short Term Goals - 04/03/19 1112      PT SHORT TERM GOAL #1   Title  Patient will be adherent to HEP at least 3x a week to improve functional strength and balance for better safety at home.    Baseline  8/17: not doing exercises;    Time  4    Period  Weeks    Status  Partially Met    Target Date  02/22/19      PT SHORT TERM GOAL #2    Title  Patient will deny any falls over past 2 weeks to demonstrate improved safety awareness at home and work.    Baseline  8/17: no falls    Time  4    Period  Weeks    Status  Achieved    Target Date  02/22/19        PT Long Term Goals - 04/03/19 1113      PT LONG TERM GOAL #1   Title  Patient will increase BLE gross strength to 4+/5 as to improve functional strength for independent gait, increased standing tolerance and increased ADL ability.    Time  4    Period  Weeks    Status  Partially Met    Target Date  05/01/19  PT LONG TERM GOAL #2   Title  Patient will score >20 on mini best test to indicate low fall risk and exhibit improved general mobility with ADLs.    Baseline  03/05/19: 18/28, improved from 15/28    Time  4    Period  Weeks    Status  Partially Met    Target Date  05/01/19      PT LONG TERM GOAL #3   Title  Patient will be independent in getting up from floor with minimal difficulty to improve fall recovery at home.    Time  4    Period  Weeks    Status  Partially Met    Target Date  05/01/19      PT LONG TERM GOAL #4   Title  Patient will increase 10 meter walk test to >1.88ms as to improve gait speed for better community ambulation and to reduce fall risk.    Baseline  8/17: 1.24 m/s    Time  4    Period  Weeks    Status  Achieved    Target Date  05/01/19            Plan - 04/23/19 1528    Clinical Impression Statement  Patient tolerated session fair. He was more fatigued this session and required increased verbal cues for proper activity technique. He required increased time and had a harder time with dual task challenge. Patient also exhibits increased foot drag this session. He would benefit from additional skilled PT intervention to improve strength, balance and gait safety; Reinforced HEP;    Rehab Potential  Fair    PT Frequency  2x / week    PT Duration  4 weeks    PT Treatment/Interventions  Cryotherapy;Moist Heat;Gait  training;Stair training;Functional mobility training;Therapeutic activities;Therapeutic exercise;Balance training;Neuromuscular re-education;Patient/family education;Orthotic Fit/Training;Energy conservation    PT Next Visit Plan  foot clearance, strengthening    PT Home Exercise Plan  no updates today    Consulted and Agree with Plan of Care  Patient       Patient will benefit from skilled therapeutic intervention in order to improve the following deficits and impairments:  Abnormal gait, Decreased activity tolerance, Decreased balance, Decreased endurance, Decreased knowledge of precautions, Decreased mobility, Decreased safety awareness, Decreased strength, Difficulty walking, Postural dysfunction  Visit Diagnosis: Other abnormalities of gait and mobility  Muscle weakness (generalized)  Unsteadiness on feet  Abnormal posture     Problem List Patient Active Problem List   Diagnosis Date Noted  . AMS (altered mental status) 01/18/2019  . Altered mental status 01/17/2019  . Incarcerated inguinal hernia 07/12/2018  . Right inguinal hernia 11/25/2016  . [place]   . Pleural effusion on right   . Pleural effusion, bacterial   . [place] 06[place]  . Impingement syndrome of shoulder region 02/25/2016  . Shoulder pain 02/25/2016  . Subacromial impingement 02/25/2016  . Mixed hyperlipidemia 04/01/2015  . Murmur, cardiac 04/01/2015    Raine Blodgett PT, DPT 04/23/2019, 3:30 PM  [facility] [address] [address]Phone: [phone]  Fax:  [phone] Name: Jeffery LINDROTHMRN: [id number] of Birth: 307-25-1932

## 2019-04-25 ENCOUNTER — Ambulatory Visit: Payer: Medicare Other | Admitting: Physical Therapy

## 2019-05-02 ENCOUNTER — Ambulatory Visit: Payer: Medicare Other

## 2019-05-02 ENCOUNTER — Other Ambulatory Visit: Payer: Self-pay

## 2019-05-02 DIAGNOSIS — R2689 Other abnormalities of gait and mobility: Secondary | ICD-10-CM | POA: Diagnosis not present

## 2019-05-02 DIAGNOSIS — R293 Abnormal posture: Secondary | ICD-10-CM

## 2019-05-02 DIAGNOSIS — M6281 Muscle weakness (generalized): Secondary | ICD-10-CM

## 2019-05-02 DIAGNOSIS — R2681 Unsteadiness on feet: Secondary | ICD-10-CM

## 2019-05-02 NOTE — Therapy (Signed)
[place] MAIN [facility] SERVICES [address] [place], [place], [address] Phone: [phone]   Fax:  [phone]  Physical Therapy Treatment  Patient Details  Name: Jeffery [name] MRN: [id number] Date of Birth: 04-Apr-1931 Referring Provider (PT): Dr. Benita Stabile PCP   Encounter Date: 05/02/2019  PT End of Session - 05/02/19 1316    Visit Number  22    Number of Visits  25    Date for PT Re-Evaluation  05/01/19    Authorization Type  goals last assessed 04/03/19    PT Start Time  1301    PT Stop Time  1341    PT Time Calculation (min)  40 min    Activity Tolerance  Patient tolerated treatment well;No increased pain    Behavior During Therapy  WFL for tasks assessed/performed       Past Medical History:  Diagnosis Date  . Does use hearing aid   . Empyema lung ([place]) 06/03/2016   MODERATE WBC PRESENT, PREDOMINANTLY PMN, STREP INTERMEDIUS  . Hyperlipidemia   . Pneumonia Nov/Dec 2017    Past Surgical History:  Procedure Laterality Date  . APPENDECTOMY  1971  . COLONOSCOPY  2014  . EYE SURGERY     cataract surgery   . GREEN LIGHT LASER TURP (TRANSURETHRAL RESECTION OF PROSTATE N/A 06/20/2018   Procedure: GREEN LIGHT LASER TURP (TRANSURETHRAL RESECTION OF PROSTATE;  Surgeon: Royston Cowper, MD;  Location: [facility] ORS;  Service: Urology;  Laterality: N/A;  . HERNIA REPAIR Left 2008  . INGUINAL HERNIA REPAIR Right 12/01/2016   Right inguinal hernia repair with medium Ultra Pro mesh.  . INGUINAL HERNIA REPAIR Left 07/12/2018   Procedure: HERNIA REPAIR INGUINAL ADULT;  Surgeon: Jules Husbands, MD;  Location: [facility] ORS;  Service: General;  Laterality: Left;  . INSERTION OF MESH Right 12/01/2016   Procedure: INSERTION OF MESH;  Surgeon: Robert Bellow, MD;  Location: [facility] ORS;  Service: General;  Laterality: Right;  . TONSILLECTOMY  1944    There were no vitals filed for this visit.  Subjective Assessment - 05/02/19 1315    Subjective  Pt  is doing well today. Doesnt really work on his HEP much per wife, but he does walk the dog quite  abit.    Pertinent History  85 yo Male s/p prostate surgery in Dec 2019 and had subsequent PT in Jan 2020. PT did not help but was suggested that patient receive outpatient PT for weakness and imbalance. He was scheduled to start PT in April 2020 but then was unable to start due to coronavirus. Patient is now wanting to start PT. He has been having trouble with his memory and was recently started on Aricept. However patient started getting confused, wife suspected UTI, patient went to hospital and was diagnosed with abnormal labs and a-fib. They are waiting to hear from cardiologist and neurologist regarding medication and when to resume aricept. He has had 2 falls in last week; He does take frequent naps and will sleep well at night; He does continue to have urinary leakage due to prostate surgery; In addition, patient is very hard of hearing; He denies any dizziness; He denies any numbness/tingling in feet; He uses a SPC intermittently; he is not consistent with using it due to poor memory; He does enjoy walking the dog multiple times a day but only goes a short distance; He does report having to be careful to not let the dog pull him over or  trip him;    Currently in Pain?  No/denies       INTERVENTION THIS DATE:  -Gait in hallway: -forward walking with head turns calling out cards red/black to challenge cognition with forward walking x4 laps with cues to increase speed, increase ankle DF at heel strike for better foot clearance. -Forward walking dribbling basketball x40 feet x2 Side stepping bouncing basketball x 20 feet x1 set each direction; patient exhibits heavy foot drag requiring cues to increase foot clearance with dual task challenge; -Backwards AMB with head-turns and card calling 1x60 -Lateral step-up and over 2x8 bilat floor to 6" step -Lateral side stepping from red mat to 4" step  -Forward  stepping from red mat to 4" step -180 degree turns on red mat x10 -STS from elevated plinth 10x, BBall in hands, redmat underfoot.  -narrow-ish stance foam, 5lb straightwaight chest press x15; trunk rotation 20x alternating sides c 5lb straight weight -FWD stepup from red mat to airex on red mat 1x10        PT Short Term Goals - 04/03/19 1112      PT SHORT TERM GOAL #1   Title  Patient will be adherent to HEP at least 3x a week to improve functional strength and balance for better safety at home.    Baseline  8/17: not doing exercises;    Time  4    Period  Weeks    Status  Partially Met    Target Date  02/22/19      PT SHORT TERM GOAL #2   Title  Patient will deny any falls over past 2 weeks to demonstrate improved safety awareness at home and work.    Baseline  8/17: no falls    Time  4    Period  Weeks    Status  Achieved    Target Date  02/22/19        PT Long Term Goals - 04/03/19 1113      PT LONG TERM GOAL #1   Title  Patient will increase BLE gross strength to 4+/5 as to improve functional strength for independent gait, increased standing tolerance and increased ADL ability.    Time  4    Period  Weeks    Status  Partially Met    Target Date  05/01/19      PT LONG TERM GOAL #2   Title  Patient will score >20 on mini best test to indicate low fall risk and exhibit improved general mobility with ADLs.    Baseline  03/05/19: 18/28, improved from 15/28    Time  4    Period  Weeks    Status  Partially Met    Target Date  05/01/19      PT LONG TERM GOAL #3   Title  Patient will be independent in getting up from floor with minimal difficulty to improve fall recovery at home.    Time  4    Period  Weeks    Status  Partially Met    Target Date  05/01/19      PT LONG TERM GOAL #4   Title  Patient will increase 10 meter walk test to >1.10ms as to improve gait speed for better community ambulation and to reduce fall risk.    Baseline  8/17: 1.24 m/s    Time  4     Period  Weeks    Status  Achieved    Target Date  05/01/19  Plan - 05/02/19 1316    Clinical Impression Statement  progress    PT Frequency  2x / week    PT Duration  4 weeks    PT Treatment/Interventions  Cryotherapy;Moist Heat;Gait training;Stair training;Functional mobility training;Therapeutic activities;Therapeutic exercise;Balance training;Neuromuscular re-education;Patient/family education;Orthotic Fit/Training;Energy conservation    PT Next Visit Plan  foot clearance, strengthening    PT Home Exercise Plan  no updates today       Patient will benefit from skilled therapeutic intervention in order to improve the following deficits and impairments:  Abnormal gait, Decreased activity tolerance, Decreased balance, Decreased endurance, Decreased knowledge of precautions, Decreased mobility, Decreased safety awareness, Decreased strength, Difficulty walking, Postural dysfunction  Visit Diagnosis: Other abnormalities of gait and mobility  Muscle weakness (generalized)  Unsteadiness on feet  Abnormal posture     Problem List Patient Active Problem List   Diagnosis Date Noted  . AMS (altered mental status) 01/18/2019  . Altered mental status 01/17/2019  . Incarcerated inguinal hernia 07/12/2018  . Right inguinal hernia 11/25/2016  . Empyema lung ([place])   . Pleural effusion on right   . Pleural effusion, bacterial   . Empyema ([place]) 06/17/2016  . Impingement syndrome of shoulder region 02/25/2016  . Shoulder pain 02/25/2016  . Subacromial impingement 02/25/2016  . Mixed hyperlipidemia 04/01/2015  . Murmur, cardiac 04/01/2015   1:38 PM, 05/02/19 Etta Grandchild, PT, DPT Physical Therapist - [place] [phone]     Jaidee Stipe C 05/02/2019, 1:20 PM  [place] MAIN [facility] SERVICES [address] [place], [place], [address] Phone:  [phone]   Fax:  [phone]  Name: GLADE STRAUSSER MRN: [id number] Date of Birth: 1931-02-05

## 2019-05-07 ENCOUNTER — Ambulatory Visit: Payer: Medicare Other | Admitting: Physical Therapy

## 2019-05-09 ENCOUNTER — Ambulatory Visit: Payer: Medicare Other | Admitting: Physical Therapy

## 2019-05-09 ENCOUNTER — Encounter: Payer: Self-pay | Admitting: Physical Therapy

## 2019-05-09 ENCOUNTER — Other Ambulatory Visit: Payer: Self-pay

## 2019-05-09 DIAGNOSIS — R2689 Other abnormalities of gait and mobility: Secondary | ICD-10-CM

## 2019-05-09 DIAGNOSIS — R293 Abnormal posture: Secondary | ICD-10-CM

## 2019-05-09 DIAGNOSIS — M6281 Muscle weakness (generalized): Secondary | ICD-10-CM

## 2019-05-09 DIAGNOSIS — R2681 Unsteadiness on feet: Secondary | ICD-10-CM

## 2019-05-09 NOTE — Addendum Note (Signed)
Addended by: Blanche East E on: 05/09/2019 11:58 AM   Modules accepted: Orders

## 2019-05-09 NOTE — Therapy (Signed)
[place] MAIN [facility] SERVICES [address] [place], [place], [address] Phone: [phone]   Fax:  [phone]  Physical Therapy Treatment/Discharge Summary  Patient Details  Name: Jeffery [name] MRN: [id number] Date of Birth: [birth year] Referring Provider (PT): Dr. Benita Stabile PCP   Encounter Date: 05/09/2019  PT End of Session - 05/09/19 1327    Visit Number  23    Number of Visits  25    Date for PT Re-Evaluation  05/30/19    Authorization Type  goals last assessed 04/03/19    PT Start Time  1302    PT Stop Time  1345    PT Time Calculation (min)  43 min    Equipment Utilized During Treatment  Gait belt    Activity Tolerance  Patient tolerated treatment well;No increased pain    Behavior During Therapy  WFL for tasks assessed/performed       Past Medical History:  Diagnosis Date  . Does use hearing aid   . Empyema lung ([place]) 06/03/2016   MODERATE WBC PRESENT, PREDOMINANTLY PMN, STREP INTERMEDIUS  . Hyperlipidemia   . Pneumonia Nov/Dec 2017    Past Surgical History:  Procedure Laterality Date  . APPENDECTOMY  1971  . COLONOSCOPY  2014  . EYE SURGERY     cataract surgery   . GREEN LIGHT LASER TURP (TRANSURETHRAL RESECTION OF PROSTATE N/A 06/20/2018   Procedure: GREEN LIGHT LASER TURP (TRANSURETHRAL RESECTION OF PROSTATE;  Surgeon: Royston Cowper, MD;  Location: [facility] ORS;  Service: Urology;  Laterality: N/A;  . HERNIA REPAIR Left 2008  . INGUINAL HERNIA REPAIR Right 12/01/2016   Right inguinal hernia repair with medium Ultra Pro mesh.  . INGUINAL HERNIA REPAIR Left 07/12/2018   Procedure: HERNIA REPAIR INGUINAL ADULT;  Surgeon: Jules Husbands, MD;  Location: [facility] ORS;  Service: General;  Laterality: Left;  . INSERTION OF MESH Right 12/01/2016   Procedure: INSERTION OF MESH;  Surgeon: Robert Bellow, MD;  Location: [facility] ORS;  Service: General;  Laterality: Right;  . TONSILLECTOMY  1944    There were no vitals filed for this  visit.  Subjective Assessment - 05/09/19 1324    Subjective  Pt reports he has been busy walking dog but hasn't been doing his HEP; His wife reports he is still shuffling his feet in the house; She reports he does have difficulty when carrying dirty dishes to the sink. "When he does two things at once its really difficult."    Pertinent History  85 yo Male s/p prostate surgery in Dec 2019 and had subsequent PT in Jan 2020. PT did not help but was suggested that patient receive outpatient PT for weakness and imbalance. He was scheduled to start PT in April 2020 but then was unable to start due to coronavirus. Patient is now wanting to start PT. He has been having trouble with his memory and was recently started on Aricept. However patient started getting confused, wife suspected UTI, patient went to hospital and was diagnosed with abnormal labs and a-fib. They are waiting to hear from cardiologist and neurologist regarding medication and when to resume aricept. He has had 2 falls in last week; He does take frequent naps and will sleep well at night; He does continue to have urinary leakage due to prostate surgery; In addition, patient is very hard of hearing; He denies any dizziness; He denies any numbness/tingling in feet; He uses a SPC intermittently; he is not consistent with using  it due to poor memory; He does enjoy walking the dog multiple times a [name] but only goes a short distance; He does report having to be careful to not let the dog pull him over or trip him;    Currently in Pain?  No/denies    Multiple Pain Sites  No         [facility] PT Assessment - 05/09/19 0001      Strength   Overall Strength Comments  BLE gross strength 4+/5      Standardized Balance Assessment   10 Meter Walk  1.09 m/s without AD, community ambulator, low fall risk      Mini-BESTest   Sit To Stand  Normal: Comes to stand without use of hands and stabilizes independently.    Rise to Toes  Moderate: Heels up, but not  full range (smaller than when holding hands), OR noticeable instability for 3 s.    Stand on one leg (left)  Moderate: < 20 s    Stand on one leg (right)  Moderate: < 20 s    Stand on one leg - lowest score  1    Compensatory Stepping Correction - Forward  Moderate: More than one step is required to recover equilibrium    Compensatory Stepping Correction - Backward  Moderate: More than one step is required to recover equilibrium    Compensatory Stepping Correction - Left Lateral  Severe: Falls, or cannot step    Compensatory Stepping Correction - Right Lateral  Severe:  Falls, or cannot step    Stepping Corredtion Lateral - lowest score  0    Stance - Feet together, eyes open, firm surface   Normal: 30s    Stance - Feet together, eyes closed, foam surface   Normal: 30s    Incline - Eyes Closed  Normal: Stands independently 30s and aligns with gravity    Change in Gait Speed  Normal: Significantly changes walkling speed without imbalance    Walk with head turns - Horizontal  Normal: performs head turns with no change in gait speed and good balance    Walk with pivot turns  Normal: Turns with feet close FAST (< 3 steps) with good balance.    Step over obstacles  Normal: Able to step over box with minimal change of gait speed and with good balance.    Timed UP & GO with Dual Task  Severe: Stops counting while walking OR stops walking while counting.    Mini-BEST total score  20       TREATMENT:  Instructed patient in Mini Best, 10 meter walk and assessed strength, see above;  Patient requires supervision and verbal cues for correct activity during outcome measure assessment; He has made progress and/or met most goals and is independent in self care ADLs.  Re-inforced HEP with instruction on how to add cognitive challenge when walking dog for dual task challenge;  Recommend discharge from PT; patient and caregiver agreeable;                    PT Education - 05/09/19 1327     Education Details  balance/gait safety; Progress towards goals;    Person(s) Educated  Patient    Methods  Explanation;Verbal cues    Comprehension  Verbalized understanding;Returned demonstration;Verbal cues required;Need further instruction       PT Short Term Goals - 05/09/19 1328      PT SHORT TERM GOAL #1   Title  Patient will be  adherent to HEP at least 3x a week to improve functional strength and balance for better safety at home.    Baseline  8/17: not doing exercises;    Time  4    Period  Weeks    Status  Not Met    Target Date  02/22/19      PT SHORT TERM GOAL #2   Title  Patient will deny any falls over past 2 weeks to demonstrate improved safety awareness at home and work.    Baseline  8/17: no falls    Time  4    Period  Weeks    Status  Achieved    Target Date  02/22/19        PT Long Term Goals - 05/09/19 1328      PT LONG TERM GOAL #1   Title  Patient will increase BLE gross strength to 4+/5 as to improve functional strength for independent gait, increased standing tolerance and increased ADL ability.    Time  4    Period  Weeks    Status  Achieved    Target Date  05/30/19      PT LONG TERM GOAL #2   Title  Patient will score >20 on mini best test to indicate low fall risk and exhibit improved general mobility with ADLs.    Baseline  03/05/19: 18/28, improved from 15/28, 10/21: 20/30    Time  4    Period  Weeks    Status  Partially Met    Target Date  05/30/19      PT LONG TERM GOAL #3   Title  Patient will be independent in getting up from floor with minimal difficulty to improve fall recovery at home.    Time  4    Period  Weeks    Status  Achieved    Target Date  05/30/19      PT LONG TERM GOAL #4   Title  Patient will increase 10 meter walk test to >1.64ms as to improve gait speed for better community ambulation and to reduce fall risk.    Baseline  8/17: 1.24 m/s    Time  4    Period  Weeks    Status  Achieved    Target Date  05/30/19             Plan - 05/09/19 1441    Clinical Impression Statement  Patient presents to therapy doing well. Instructed patient in mini best and other outcome measures to address goals. Patient exhibits improvement in strength and balance. However he continues to have difficulty with dual task/multi-tasking often slowing gait speed or shuffling steps with cognitive tasks. Patient has not been adherent with HEP. He does walk his dog regularly throughout the [name] and is independent in most ADLs. He is appropriate for discharge from PT at this time as he has met most goals and reports functioning at good functional level. Reinforced importance of HEP adherence to avoid regression. Patient and caregiver verbalized understanding;    Rehab Potential  Fair    PT Frequency  2x / week    PT Duration  4 weeks    PT Treatment/Interventions  Cryotherapy;Moist Heat;Gait training;Stair training;Functional mobility training;Therapeutic activities;Therapeutic exercise;Balance training;Neuromuscular re-education;Patient/family education;Orthotic Fit/Training;Energy conservation    PT Next Visit Plan  foot clearance, strengthening    PT Home Exercise Plan  no updates today    Consulted and Agree with Plan of Care  Patient  Patient will benefit from skilled therapeutic intervention in order to improve the following deficits and impairments:  Abnormal gait, Decreased activity tolerance, Decreased balance, Decreased endurance, Decreased knowledge of precautions, Decreased mobility, Decreased safety awareness, Decreased strength, Difficulty walking, Postural dysfunction  Visit Diagnosis: Other abnormalities of gait and mobility  Muscle weakness (generalized)  Unsteadiness on feet  Abnormal posture     Problem List Patient Active Problem List   Diagnosis Date Noted  . AMS (altered mental status) 01/18/2019  . Altered mental status 01/17/2019  . Incarcerated inguinal hernia 07/12/2018  . Right  inguinal hernia 11/25/2016  . Empyema lung ([place])   . Pleural effusion on right   . Pleural effusion, bacterial   . Empyema ([place]) 06/17/2016  . Impingement syndrome of shoulder region 02/25/2016  . Shoulder pain 02/25/2016  . Subacromial impingement 02/25/2016  . Mixed hyperlipidemia 04/01/2015  . Murmur, cardiac 04/01/2015    Emanuela Runnion PT, DPT 05/09/2019, 2:46 PM  [place] MAIN [facility] SERVICES [address] [place], [place], [address] Phone: [phone]   Fax:  [phone]  Name: Jeffery [name] MRN: [id number] Date of Birth: 11/28/1930

## 2019-05-10 NOTE — Addendum Note (Signed)
Addended by: Etta Grandchild on: 05/10/2019 09:33 AM   Modules accepted: Orders

## 2019-05-22 ENCOUNTER — Ambulatory Visit: Payer: Medicare Other | Admitting: Physical Therapy

## 2019-05-24 ENCOUNTER — Ambulatory Visit: Payer: Medicare Other | Admitting: Physical Therapy

## 2019-05-28 ENCOUNTER — Ambulatory Visit: Payer: Medicare Other | Admitting: Physical Therapy

## 2019-05-30 ENCOUNTER — Ambulatory Visit: Payer: Medicare Other | Admitting: Physical Therapy

## 2019-06-04 ENCOUNTER — Ambulatory Visit: Payer: Medicare Other | Admitting: Physical Therapy

## 2019-06-06 ENCOUNTER — Ambulatory Visit: Payer: Medicare Other | Admitting: Physical Therapy

## 2019-06-11 ENCOUNTER — Ambulatory Visit: Payer: Medicare Other | Admitting: Physical Therapy

## 2019-06-13 ENCOUNTER — Ambulatory Visit: Payer: Medicare Other | Admitting: Physical Therapy

## 2019-06-18 ENCOUNTER — Ambulatory Visit: Payer: Medicare Other | Admitting: Physical Therapy

## 2019-06-20 ENCOUNTER — Ambulatory Visit: Payer: Medicare Other | Admitting: Physical Therapy

## 2019-10-13 ENCOUNTER — Inpatient Hospital Stay
Admission: EM | Admit: 2019-10-13 | Discharge: 2019-10-18 | DRG: 871 | Disposition: E | Payer: Medicare Other | Attending: Internal Medicine | Admitting: Internal Medicine

## 2019-10-13 ENCOUNTER — Emergency Department: Payer: Medicare Other

## 2019-10-13 ENCOUNTER — Other Ambulatory Visit: Payer: Self-pay

## 2019-10-13 DIAGNOSIS — T782XXA Anaphylactic shock, unspecified, initial encounter: Secondary | ICD-10-CM | POA: Diagnosis present

## 2019-10-13 DIAGNOSIS — Z87891 Personal history of nicotine dependence: Secondary | ICD-10-CM

## 2019-10-13 DIAGNOSIS — I10 Essential (primary) hypertension: Secondary | ICD-10-CM | POA: Diagnosis present

## 2019-10-13 DIAGNOSIS — A021 Salmonella sepsis: Secondary | ICD-10-CM | POA: Diagnosis not present

## 2019-10-13 DIAGNOSIS — J811 Chronic pulmonary edema: Secondary | ICD-10-CM | POA: Diagnosis present

## 2019-10-13 DIAGNOSIS — R6521 Severe sepsis with septic shock: Secondary | ICD-10-CM | POA: Diagnosis present

## 2019-10-13 DIAGNOSIS — Z79899 Other long term (current) drug therapy: Secondary | ICD-10-CM

## 2019-10-13 DIAGNOSIS — E119 Type 2 diabetes mellitus without complications: Secondary | ICD-10-CM | POA: Diagnosis present

## 2019-10-13 DIAGNOSIS — E785 Hyperlipidemia, unspecified: Secondary | ICD-10-CM | POA: Diagnosis present

## 2019-10-13 DIAGNOSIS — N368 Other specified disorders of urethra: Secondary | ICD-10-CM | POA: Diagnosis present

## 2019-10-13 DIAGNOSIS — N320 Bladder-neck obstruction: Secondary | ICD-10-CM | POA: Diagnosis present

## 2019-10-13 DIAGNOSIS — R0602 Shortness of breath: Secondary | ICD-10-CM

## 2019-10-13 DIAGNOSIS — J189 Pneumonia, unspecified organism: Secondary | ICD-10-CM | POA: Diagnosis present

## 2019-10-13 DIAGNOSIS — J9600 Acute respiratory failure, unspecified whether with hypoxia or hypercapnia: Secondary | ICD-10-CM | POA: Diagnosis present

## 2019-10-13 DIAGNOSIS — T782XXS Anaphylactic shock, unspecified, sequela: Secondary | ICD-10-CM | POA: Diagnosis not present

## 2019-10-13 DIAGNOSIS — A419 Sepsis, unspecified organism: Secondary | ICD-10-CM | POA: Diagnosis present

## 2019-10-13 DIAGNOSIS — L270 Generalized skin eruption due to drugs and medicaments taken internally: Secondary | ICD-10-CM | POA: Diagnosis present

## 2019-10-13 DIAGNOSIS — G9340 Encephalopathy, unspecified: Secondary | ICD-10-CM | POA: Diagnosis present

## 2019-10-13 DIAGNOSIS — I359 Nonrheumatic aortic valve disorder, unspecified: Secondary | ICD-10-CM | POA: Diagnosis present

## 2019-10-13 DIAGNOSIS — E876 Hypokalemia: Secondary | ICD-10-CM | POA: Diagnosis present

## 2019-10-13 DIAGNOSIS — F0281 Dementia in other diseases classified elsewhere with behavioral disturbance: Secondary | ICD-10-CM | POA: Diagnosis present

## 2019-10-13 DIAGNOSIS — Z20822 Contact with and (suspected) exposure to covid-19: Secondary | ICD-10-CM | POA: Diagnosis present

## 2019-10-13 DIAGNOSIS — N390 Urinary tract infection, site not specified: Secondary | ICD-10-CM

## 2019-10-13 DIAGNOSIS — I480 Paroxysmal atrial fibrillation: Secondary | ICD-10-CM | POA: Diagnosis present

## 2019-10-13 DIAGNOSIS — E872 Acidosis: Secondary | ICD-10-CM | POA: Diagnosis not present

## 2019-10-13 DIAGNOSIS — J439 Emphysema, unspecified: Secondary | ICD-10-CM | POA: Diagnosis present

## 2019-10-13 DIAGNOSIS — Z8249 Family history of ischemic heart disease and other diseases of the circulatory system: Secondary | ICD-10-CM

## 2019-10-13 DIAGNOSIS — Z809 Family history of malignant neoplasm, unspecified: Secondary | ICD-10-CM

## 2019-10-13 DIAGNOSIS — N179 Acute kidney failure, unspecified: Secondary | ICD-10-CM | POA: Diagnosis present

## 2019-10-13 DIAGNOSIS — R3129 Other microscopic hematuria: Secondary | ICD-10-CM | POA: Diagnosis present

## 2019-10-13 DIAGNOSIS — G309 Alzheimer's disease, unspecified: Secondary | ICD-10-CM | POA: Diagnosis present

## 2019-10-13 DIAGNOSIS — I469 Cardiac arrest, cause unspecified: Secondary | ICD-10-CM | POA: Diagnosis present

## 2019-10-13 DIAGNOSIS — Z7901 Long term (current) use of anticoagulants: Secondary | ICD-10-CM

## 2019-10-13 DIAGNOSIS — Z9079 Acquired absence of other genital organ(s): Secondary | ICD-10-CM

## 2019-10-13 DIAGNOSIS — J181 Lobar pneumonia, unspecified organism: Secondary | ICD-10-CM | POA: Diagnosis not present

## 2019-10-13 DIAGNOSIS — N3000 Acute cystitis without hematuria: Secondary | ICD-10-CM | POA: Diagnosis not present

## 2019-10-13 DIAGNOSIS — R6520 Severe sepsis without septic shock: Secondary | ICD-10-CM | POA: Diagnosis not present

## 2019-10-13 DIAGNOSIS — R338 Other retention of urine: Secondary | ICD-10-CM | POA: Diagnosis not present

## 2019-10-13 DIAGNOSIS — F0391 Unspecified dementia with behavioral disturbance: Secondary | ICD-10-CM | POA: Diagnosis not present

## 2019-10-13 DIAGNOSIS — T361X5A Adverse effect of cephalosporins and other beta-lactam antibiotics, initial encounter: Secondary | ICD-10-CM | POA: Diagnosis present

## 2019-10-13 DIAGNOSIS — Z9049 Acquired absence of other specified parts of digestive tract: Secondary | ICD-10-CM

## 2019-10-13 LAB — URINALYSIS, COMPLETE (UACMP) WITH MICROSCOPIC
Bilirubin Urine: NEGATIVE
Glucose, UA: NEGATIVE mg/dL
Ketones, ur: NEGATIVE mg/dL
Nitrite: NEGATIVE
Protein, ur: 30 mg/dL — AB
RBC / HPF: >50 RBC/hpf — ABNORMAL HIGH (ref 0–5)
Specific Gravity, Urine: 1.018 (ref 1.005–1.030)
WBC, UA: >50 WBC/hpf — ABNORMAL HIGH (ref 0–5)
pH: 5 (ref 5.0–8.0)

## 2019-10-13 LAB — CBC WITH DIFFERENTIAL/PLATELET
Abs Immature Granulocytes: 0.13 10*3/uL — ABNORMAL HIGH (ref 0.00–0.07)
Basophils Absolute: 0.1 10*3/uL (ref 0.0–0.1)
Basophils Relative: 0 %
Eosinophils Absolute: 0.2 10*3/uL (ref 0.0–0.5)
Eosinophils Relative: 1 %
HCT: 40 % (ref 39.0–52.0)
Hemoglobin: 13.2 g/dL (ref 13.0–17.0)
Immature Granulocytes: 1 %
Lymphocytes Relative: 1 %
Lymphs Abs: 0.2 10*3/uL — ABNORMAL LOW (ref 0.7–4.0)
MCH: 29.9 pg (ref 26.0–34.0)
MCHC: 33 g/dL (ref 30.0–36.0)
MCV: 90.5 fL (ref 80.0–100.0)
Monocytes Absolute: 0.5 10*3/uL (ref 0.1–1.0)
Monocytes Relative: 3 %
Neutro Abs: 20.1 10*3/uL — ABNORMAL HIGH (ref 1.7–7.7)
Neutrophils Relative %: 94 %
Platelets: 164 10*3/uL (ref 150–400)
RBC: 4.42 MIL/uL (ref 4.22–5.81)
RDW: 13.5 % (ref 11.5–15.5)
WBC: 21.3 10*3/uL — ABNORMAL HIGH (ref 4.0–10.5)
nRBC: 0 % (ref 0.0–0.2)

## 2019-10-13 LAB — COMPREHENSIVE METABOLIC PANEL
ALT: 15 U/L (ref 0–44)
AST: 20 U/L (ref 15–41)
Albumin: 4 g/dL (ref 3.5–5.0)
Alkaline Phosphatase: 48 U/L (ref 38–126)
Anion gap: 10 (ref 5–15)
BUN: 31 mg/dL — ABNORMAL HIGH (ref 8–23)
CO2: 23 mmol/L (ref 22–32)
Calcium: 8.7 mg/dL — ABNORMAL LOW (ref 8.9–10.3)
Chloride: 106 mmol/L (ref 98–111)
Creatinine, Ser: 1.38 mg/dL — ABNORMAL HIGH (ref 0.61–1.24)
GFR calc Af Amer: 53 mL/min — ABNORMAL LOW (ref >60)
GFR calc non Af Amer: 46 mL/min — ABNORMAL LOW (ref >60)
Glucose, Bld: 132 mg/dL — ABNORMAL HIGH (ref 70–99)
Potassium: 3.4 mmol/L — ABNORMAL LOW (ref 3.5–5.1)
Sodium: 139 mmol/L (ref 135–145)
Total Bilirubin: 0.9 mg/dL (ref 0.3–1.2)
Total Protein: 6.6 g/dL (ref 6.5–8.1)

## 2019-10-13 LAB — PROTIME-INR
INR: 1.5 — ABNORMAL HIGH (ref 0.8–1.2)
Prothrombin Time: 17.6 seconds — ABNORMAL HIGH (ref 11.4–15.2)

## 2019-10-13 LAB — RESPIRATORY PANEL BY RT PCR (FLU A&B, COVID)
Influenza A by PCR: NEGATIVE
Influenza B by PCR: NEGATIVE
SARS Coronavirus 2 by RT PCR: NEGATIVE

## 2019-10-13 LAB — LACTIC ACID, PLASMA
Lactic Acid, Venous: 2.8 mmol/L (ref 0.5–1.9)
Lactic Acid, Venous: 3.2 mmol/L (ref 0.5–1.9)

## 2019-10-13 MED ORDER — ONDANSETRON HCL 4 MG PO TABS: 4.0000 mg | ORAL_TABLET | Freq: Four times a day (QID) | ORAL | Status: DC | PRN

## 2019-10-13 MED ORDER — RIVASTIGMINE TARTRATE 1.5 MG PO CAPS
1.5000 mg | ORAL_CAPSULE | Freq: Two times a day (BID) | ORAL | Status: DC
  Administered 2019-10-14: 01:00:00 1.5 mg via ORAL
  Filled 2019-10-13 (×4): qty 1

## 2019-10-13 MED ORDER — TRAZODONE HCL 50 MG PO TABS
25.0000 mg | ORAL_TABLET | Freq: Every evening | ORAL | Status: DC | PRN
  Administered 2019-10-14: 25 mg via ORAL
  Filled 2019-10-13: qty 1

## 2019-10-13 MED ORDER — SODIUM CHLORIDE 0.9 % IV SOLN
1.0000 g | Freq: Once | INTRAVENOUS | Status: AC
  Administered 2019-10-13: 20:00:00 1 g via INTRAVENOUS
  Filled 2019-10-13: qty 10

## 2019-10-13 MED ORDER — ACETAMINOPHEN 325 MG PO TABS
650.0000 mg | ORAL_TABLET | Freq: Four times a day (QID) | ORAL | Status: DC | PRN
  Filled 2019-10-13 (×2): qty 2

## 2019-10-13 MED ORDER — DIPHENHYDRAMINE HCL 50 MG/ML IJ SOLN
25.0000 mg | Freq: Once | INTRAMUSCULAR | Status: AC
  Administered 2019-10-13: 23:00:00 25 mg via INTRAVENOUS
  Filled 2019-10-13: qty 1

## 2019-10-13 MED ORDER — ACETAMINOPHEN 500 MG PO TABS
1000.0000 mg | ORAL_TABLET | Freq: Once | ORAL | Status: AC
  Administered 2019-10-13: 1000 mg via ORAL

## 2019-10-13 MED ORDER — SODIUM CHLORIDE 0.9 % IV BOLUS
1500.0000 mL | Freq: Once | INTRAVENOUS | Status: AC
  Administered 2019-10-13: 1500 mL via INTRAVENOUS

## 2019-10-13 MED ORDER — METHYLPREDNISOLONE SODIUM SUCC 125 MG IJ SOLR
60.0000 mg | Freq: Two times a day (BID) | INTRAMUSCULAR | Status: DC
  Administered 2019-10-14: 10:00:00 60 mg via INTRAVENOUS
  Filled 2019-10-13: qty 2

## 2019-10-13 MED ORDER — ONDANSETRON HCL 4 MG/2ML IJ SOLN: 4.0000 mg | Freq: Four times a day (QID) | INTRAMUSCULAR | Status: DC | PRN

## 2019-10-13 MED ORDER — SODIUM CHLORIDE 0.9 % IV BOLUS
500.0000 mL | Freq: Once | INTRAVENOUS | Status: AC
  Administered 2019-10-14: 500 mL via INTRAVENOUS

## 2019-10-13 MED ORDER — METHYLPREDNISOLONE SODIUM SUCC 125 MG IJ SOLR
125.0000 mg | Freq: Once | INTRAMUSCULAR | Status: AC
  Administered 2019-10-13: 23:00:00 125 mg via INTRAVENOUS
  Filled 2019-10-13: qty 2

## 2019-10-13 MED ORDER — LEVOFLOXACIN IN D5W 750 MG/150ML IV SOLN: 750.0000 mg | INTRAVENOUS | Status: DC

## 2019-10-13 MED ORDER — MAGNESIUM HYDROXIDE 400 MG/5ML PO SUSP: 30.0000 mL | Freq: Every day | ORAL | Status: DC | PRN

## 2019-10-13 MED ORDER — FAMOTIDINE IN NACL 20-0.9 MG/50ML-% IV SOLN
20.0000 mg | Freq: Once | INTRAVENOUS | Status: AC
  Administered 2019-10-13: 20 mg via INTRAVENOUS
  Filled 2019-10-13: qty 50

## 2019-10-13 MED ORDER — NOREPINEPHRINE 4 MG/250ML-% IV SOLN
2.0000 ug/min | INTRAVENOUS | Status: DC
  Administered 2019-10-14: 2 ug/min via INTRAVENOUS
  Filled 2019-10-13 (×2): qty 250

## 2019-10-13 MED ORDER — FAMOTIDINE IN NACL 20-0.9 MG/50ML-% IV SOLN: 20.0000 mg | INTRAVENOUS | Status: DC

## 2019-10-13 MED ORDER — METOPROLOL TARTRATE 25 MG PO TABS
25.0000 mg | ORAL_TABLET | Freq: Two times a day (BID) | ORAL | Status: DC
  Filled 2019-10-13: qty 1

## 2019-10-13 MED ORDER — SODIUM CHLORIDE 0.9 % IV SOLN: INTRAVENOUS | Status: DC

## 2019-10-13 MED ORDER — APIXABAN 5 MG PO TABS
5.0000 mg | ORAL_TABLET | Freq: Two times a day (BID) | ORAL | Status: DC
  Administered 2019-10-14: 01:00:00 5 mg via ORAL
  Filled 2019-10-13: qty 1

## 2019-10-13 MED ORDER — ACETAMINOPHEN 650 MG RE SUPP: 650.0000 mg | Freq: Four times a day (QID) | RECTAL | Status: DC | PRN

## 2019-10-13 MED ORDER — DIPHENHYDRAMINE HCL 50 MG/ML IJ SOLN
25.0000 mg | Freq: Four times a day (QID) | INTRAMUSCULAR | Status: DC
  Administered 2019-10-14 (×2): 25 mg via INTRAVENOUS
  Filled 2019-10-13 (×2): qty 1

## 2019-10-13 MED ORDER — ENOXAPARIN SODIUM 40 MG/0.4ML ~~LOC~~ SOLN: 40.0000 mg | SUBCUTANEOUS | Status: DC

## 2019-10-13 MED ORDER — SODIUM CHLORIDE 0.9 % IV SOLN: 250.0000 mL | INTRAVENOUS | Status: DC

## 2019-10-13 MED ORDER — SODIUM CHLORIDE 0.9 % IV SOLN: 1.0000 g | INTRAVENOUS | Status: DC

## 2019-10-13 NOTE — ED Triage Notes (Signed)
Pt to ED via [facility] from [place]. Per EMS pt recently dx with UTI and started sulfa anx yesterday with no improvement. Pt has taken 1 pill yesterday and 1 pill today. Per EMS pt with hx of dementia. EMS administered 2 589mL bolus due to BP.   EMS VS: Temp 102.1 orally  RR 24-26 HR 92 CBG 124 BP 94/52 after 1st 500 bolus 96/58  Upon arrival pt in NAD. Pt oriented to self. Pt has caregiver and POA.

## 2019-10-13 NOTE — ED Provider Notes (Addendum)
[facility] Emergency Department Provider Note  ____________________________________________   First MD Initiated Contact with Patient 10/03/2019 1909     (approximate)  I have reviewed the triage vital signs and the nursing notes.  History  Chief Complaint Urinary Tract Infection    HPI Jeffery [name] is a 86 y.o. male with a history of Alzheimer's, concern for Parkinson's, paroxysmal atrial fibrillation (on Eliquis) who presents to the emergency department for UTI and increased confusion above baseline.  Patient resides at the [facility].  Wife states she and the caregiver have noticed the patient to be more confused over the last week or so.  They recently moved residence and initially thought his confusion might be due to the move.  However, caregiver was concerned earlier this week given his ongoing confusion and developing generalized weakness and had the patient seen as an outpatient, where UA was twice diagnostic for UTI.  Started on Bactrim yesterday, has received 2 doses.  Today he was even more weak and lethargic than normal, therefore wife and caregiver activated EMS.  With EMS patient was noted to be febrile, mildly hypotensive 90s/50s.  Patient has no acute complaints on arrival, asking for water.  Wife at bedside provides additional history.  Caveat: History primarily obtained from wife at bedside given patient's history of dementia and superimposed confusion   Past Medical Hx Past Medical History:  Diagnosis Date  . Does use hearing aid   . Empyema lung ([place]) 06/03/2016   MODERATE WBC PRESENT, PREDOMINANTLY PMN, STREP INTERMEDIUS  . Hyperlipidemia   . Pneumonia Nov/Dec 2017    Problem List Patient Active Problem List   Diagnosis Date Noted  . AMS (altered mental status) 01/18/2019  . Altered mental status 01/17/2019  . Incarcerated inguinal hernia 07/12/2018  . Right inguinal hernia 11/25/2016  . Empyema lung ([place])   . Pleural  effusion on right   . Pleural effusion, bacterial   . Empyema ([place]) 06/17/2016  . Impingement syndrome of shoulder region 02/25/2016  . Shoulder pain 02/25/2016  . Subacromial impingement 02/25/2016  . Mixed hyperlipidemia 04/01/2015  . Murmur, cardiac 04/01/2015    Past Surgical Hx Past Surgical History:  Procedure Laterality Date  . APPENDECTOMY  1971  . COLONOSCOPY  2014  . EYE SURGERY     cataract surgery   . GREEN LIGHT LASER TURP (TRANSURETHRAL RESECTION OF PROSTATE N/A 06/20/2018   Procedure: GREEN LIGHT LASER TURP (TRANSURETHRAL RESECTION OF PROSTATE;  Surgeon: Royston Cowper, MD;  Location: [facility] ORS;  Service: Urology;  Laterality: N/A;  . HERNIA REPAIR Left 2008  . INGUINAL HERNIA REPAIR Right 12/01/2016   Right inguinal hernia repair with medium Ultra Pro mesh.  . INGUINAL HERNIA REPAIR Left 07/12/2018   Procedure: HERNIA REPAIR INGUINAL ADULT;  Surgeon: Jules Husbands, MD;  Location: [facility] ORS;  Service: General;  Laterality: Left;  . INSERTION OF MESH Right 12/01/2016   Procedure: INSERTION OF MESH;  Surgeon: Robert Bellow, MD;  Location: [facility] ORS;  Service: General;  Laterality: Right;  . TONSILLECTOMY  1944    Medications Prior to Admission medications   Medication Sig Start Date End Date Taking? Authorizing Provider  apixaban (ELIQUIS) 5 MG TABS tablet Take 1 tablet (5 mg total) by mouth 2 (two) times daily. 01/20/19   Stark Jock Jude, MD  metoprolol tartrate (LOPRESSOR) 25 MG tablet Take 1 tablet (25 mg total) by mouth 2 (two) times daily. 01/20/19   Otila Back, MD  Allergies Patient has no known allergies.  Family Hx Family History  Problem Relation Age of Onset  . Heart attack Father   . ALS Sister   . Cancer Sister   . ALS Sister     Social Hx Social History   Tobacco Use  . Smoking status: Former Smoker    Packs/day: 1.00    Years: 10.00    Pack years: 10.00    Types: Cigarettes  . Smokeless tobacco: Never Used  . Tobacco comment: quit  smoking in 1970  Substance Use Topics  . Alcohol use: Yes    Comment: occ 3/week  . Drug use: No     Review of Systems Unable to reliably obtain due to patient's history of dementia and superimposed confusion   Physical Exam  Vital Signs: ED Triage Vitals  Enc Vitals Group     BP 10/04/2019 1903 121/90     Pulse Rate 09/20/2019 1903 80     Resp 09/22/2019 1903 20     Temp 10/17/2019 1903 99.7 F (37.6 C)     Temp Source 10/01/2019 1903 Oral     SpO2 09/23/2019 1903 95 %     Weight 10/07/2019 1854 142 lb 13.7 oz (64.8 kg)     Height 10/17/2019 1854 5\' 7"  (1.702 m)     Head Circumference --      Peak Flow --      Pain Score 09/23/2019 1851 0     Pain Loc --      Pain Edu? --      Excl. in [place]? --     Constitutional: Awake and alert.  Oriented to self.  Baseline dementia..  Head: Normocephalic. Atraumatic. Eyes: Conjunctivae clear, sclera anicteric. Pupils equal and symmetric. Nose: No masses or lesions. No congestion or rhinorrhea. Mouth/Throat: Wearing mask.  MM dry. Neck: No stridor. Trachea midline.  Cardiovascular: Normal rate. Extremities well perfused. Respiratory: Normal respiratory effort.  Lungs CTAB. Gastrointestinal: Soft. Non-distended. Non-tender.  Genitourinary: Deferred. Musculoskeletal: No lower extremity edema. No deformities. Neurologic: History of dementia. No gross focal or lateralizing neurologic deficits are appreciated.  Skin: Skin is warm, dry and intact. No rash noted. Psychiatric: Mood and affect are appropriate for situation.  EKG  N/A   Radiology  CXR  IMPRESSION:  Mild bibasilar atelectasis/airspace disease.     Procedures  Procedure(s) performed (including critical care):  .Critical Care Performed by: Lilia Pro., MD Authorized by: Lilia Pro., MD   Critical care provider statement:    Critical care time (minutes):  35   Critical care was necessary to treat or prevent imminent or life-threatening deterioration of the following  conditions:  Sepsis   Critical care was time spent personally by me on the following activities:  Discussions with consultants, evaluation of patient's response to treatment, examination of patient, ordering and performing treatments and interventions, ordering and review of laboratory studies, ordering and review of radiographic studies, pulse oximetry, re-evaluation of patient's condition, obtaining history from patient or surrogate and review of old charts     Initial Impression / Assessment and Plan / MDM / ED Course  86 y.o. male who presents to the ED for increased confusion above baseline, recently diagnosed with UTI  Ddx: UTI, infectious encephalopathy  Will plan for labs, including cultures, empiric antibiotics, fluids  Labs notable for WBC 21.3, lactic acid 2.8, creatinine 1.38.  Receiving fluids (1500 cc IVF here + 500 cc with EMS = 30 cc/kg total) and antibiotics. COVID negative. UA  overwhelmingly positive for infection. Will admit for urosepsis.   11:36 PM Lactic acid noted to rise from 2.8 prior to 3.2. Now developing a fever as well and BP 85/50 from normal previously. Patient continues to be asymptomatic, denies any complaints aside from being thirsty. Extremities well perfused. Perhaps related to his known urosepsis and mobilization of infection vs also consider allergic reaction as he has a patchy erythematous rash on his truck (though no prior hx of allergy to ceftriaxone, no wheezing, no lip/tongue/uvular swelling). Admitting team at bedside and aware. Ordered additional 500 cc fluids, acetaminophen, steroids, Benadryl, and famotidine.    _______________________________   As part of my medical decision making I have reviewed available labs, radiology tests, reviewed old records, obtained additional history from family.  Final Clinical Impression(s) / ED Diagnosis  Final diagnoses:  Urinary tract infection in elderly patient  Encephalopathy       Note:  This  document was prepared using Dragon voice recognition software and may include unintentional dictation errors.   Lilia Pro., MD 10/12/2019 [id number]    Lilia Pro., MD 2019-10-31 [phone]

## 2019-10-13 NOTE — ED Notes (Signed)
Pt shirt removed, rash on trunk, EDP and admitting at bedside

## 2019-10-13 NOTE — H&P (Addendum)
[facility] [place] NAME: Jeffery [name]    MR#:  [id number]  DATE OF BIRTH:  08-Mar-1931  DATE OF ADMISSION:  10/15/2019  PRIMARY CARE PHYSICIAN: Albina Billet, MD   REQUESTING/REFERRING PHYSICIAN: Derrell Lolling, MD CHIEF COMPLAINT:   Chief Complaint  Patient presents with  . Urinary Tract Infection    HISTORY OF PRESENT ILLNESS:  Jeffery [name]  is a 86 y.o. Caucasian male with a known history of COPD/emphysema, dyslipidemia and paroxysmal atrial fibrillation on Eliquis, who presented to the emergency room with the onset of altered mental status with worsening confusion over the last few days at the [facility] where he resides.Marland Kitchen  He was thought to have a UTI and and positive UA twice on an outpatient basis.  He was started on p.o. Bactrim.  He continued to have worsening confusion and lethargy that brought him to the ER today.  With EMS he was febrile with mild hypotension with blood pressure of 90s over 50s.  He denied any nausea or vomiting or abdominal pain.  No chest pain or dyspnea or cough.  He is overall a very poor historian due to his dementia.  I talked to his wife Ms. Jeffery [name] at [phone] as she left the hospital to obtain the rest of the history and notify her about the plan of care  When he presented to the ER temperature was 99.7 with otherwise normal vital signs.  Labs revealed Neck cytosis of 21.3 with neutrophilia, lactic acid of 2.8 and later 3.2 in CMP remarkable for a BUN of 31 and creatinine of 1.38 up from 7 and 0.73 on 01/20/2019.  UA was strongly positive UTI.  Influenza antigens are negative and COVID-19 PCR came back negative.  Two-view chest x-ray showed mild bibasal atelectasis/airspace disease.  Urine and blood cultures were sent.  The patient was given a gram of IV Rocephin and 1-1/2 L of IV normal saline bolus.  The patient had maculopapular irruption over his chest and abdomen with associated mild itching as he was  trying to pull his leads off.  He was then given wide-open IV normal saline and ordered IV Solu-Medrol, Benadryl and Pepcid.  He became hypotensive with a systolic blood pressure of 70s and therefore IV Levophed was also ordered.  He will be admitted to an ICU bed for further evaluation and management. PAST MEDICAL HISTORY:   Past Medical History:  Diagnosis Date  . Does use hearing aid   . Empyema lung ([place]) 06/03/2016   MODERATE WBC PRESENT, PREDOMINANTLY PMN, STREP INTERMEDIUS  . Hyperlipidemia   . Pneumonia Nov/Dec 2017  Paroxysmal atrial fibrillation on Eliquis  PAST SURGICAL HISTORY:   Past Surgical History:  Procedure Laterality Date  . APPENDECTOMY  1971  . COLONOSCOPY  2014  . EYE SURGERY     cataract surgery   . GREEN LIGHT LASER TURP (TRANSURETHRAL RESECTION OF PROSTATE N/A 06/20/2018   Procedure: GREEN LIGHT LASER TURP (TRANSURETHRAL RESECTION OF PROSTATE;  Surgeon: Royston Cowper, MD;  Location: [facility] ORS;  Service: Urology;  Laterality: N/A;  . HERNIA REPAIR Left 2008  . INGUINAL HERNIA REPAIR Right 12/01/2016   Right inguinal hernia repair with medium Ultra Pro mesh.  . INGUINAL HERNIA REPAIR Left 07/12/2018   Procedure: HERNIA REPAIR INGUINAL ADULT;  Surgeon: Jules Husbands, MD;  Location: [facility] ORS;  Service: General;  Laterality: Left;  . INSERTION OF MESH Right 12/01/2016   Procedure: INSERTION OF MESH;  Surgeon: Robert Bellow, MD;  Location: [facility] ORS;  Service: General;  Laterality: Right;  . TONSILLECTOMY  1944    SOCIAL HISTORY:   Social History   Tobacco Use  . Smoking status: Former Smoker    Packs/day: 1.00    Years: 10.00    Pack years: 10.00    Types: Cigarettes  . Smokeless tobacco: Never Used  . Tobacco comment: quit smoking in 1970  Substance Use Topics  . Alcohol use: Yes    Comment: occ 3/week    FAMILY HISTORY:   Family History  Problem Relation Age of Onset  . Heart attack Father   . ALS Sister   . Cancer Sister   . ALS  Sister     DRUG ALLERGIES:  No Known Allergies  REVIEW OF SYSTEMS:   ROS As per history of present illness. All pertinent systems were reviewed above. Constitutional,  HEENT, cardiovascular, respiratory, GI, GU, musculoskeletal, neuro, psychiatric, endocrine,  integumentary and hematologic systems were reviewed and are otherwise  negative/unremarkable except for positive findings mentioned above in the HPI.   MEDICATIONS AT HOME:   Prior to Admission medications   Medication Sig Start Date End Date Taking? Authorizing Provider  apixaban (ELIQUIS) 5 MG TABS tablet Take 1 tablet (5 mg total) by mouth 2 (two) times daily. 01/20/19  Yes Ojie, Jude, MD  metoprolol tartrate (LOPRESSOR) 25 MG tablet Take 1 tablet (25 mg total) by mouth 2 (two) times daily. 01/20/19  Yes Ojie, Jude, MD  rivastigmine (EXELON) 1.5 MG capsule Take 1.5 mg by mouth 2 (two) times daily. 09/16/19  Yes [provider]  sulfamethoxazole-trimethoprim (BACTRIM DS) 800-160 MG tablet Take 1 tablet by mouth every 12 (twelve) hours. 10/12/19  Yes [provider]      VITAL SIGNS:  Blood pressure 121/90, pulse 80, temperature 99.7 F (37.6 C), temperature source Oral, resp. rate 20, height 5\' 7"  (1.702 m), weight 64.8 kg, SpO2 95 %.  PHYSICAL EXAMINATION:  Physical Exam  GENERAL:  86 y.o.-year-old acutely ill lethargic and restless elderly Caucasian male patient lying in the bed.  EYES: Pupils equal, round, reactive to light and accommodation. No scleral icterus. Extraocular muscles intact.  HEENT: Head atraumatic, normocephalic. Oropharynx and nasopharynx clear.  NECK:  Supple, no jugular venous distention. No thyroid enlargement, no tenderness.  LUNGS: Normal breath sounds bilaterally, no wheezing, rales,rhonchi or crepitation. No use of accessory muscles of respiration.  CARDIOVASCULAR: Regular rate and rhythm, S1, S2 normal. No murmurs, rubs, or gallops.  ABDOMEN: Soft, nondistended, nontender. Bowel  sounds present. No organomegaly or mass.  EXTREMITIES: No pedal edema, cyanosis, or clubbing.  NEUROLOGIC: Cranial nerves II through XII are intact. Muscle strength 5/5 in all extremities. Sensation intact. Gait not checked.  PSYCHIATRIC: The patient is alert and oriented x 3.  Normal affect and good eye contact. SKIN: Scattered maculopapular eruption over his chest and abdomen extending to his upper extremities and to lesser extent over lower extremities.     LABORATORY PANEL:   CBC Recent Labs  Lab 10/08/2019 1858  WBC 21.3*  HGB 13.2  HCT 40.0  PLT 164   ------------------------------------------------------------------------------------------------------------------  Chemistries  Recent Labs  Lab 10/04/2019 1858  NA 139  K 3.4*  CL 106  CO2 23  GLUCOSE 132*  BUN 31*  CREATININE 1.38*  CALCIUM 8.7*  AST 20  ALT 15  ALKPHOS 48  BILITOT 0.9   ------------------------------------------------------------------------------------------------------------------  Cardiac Enzymes No results for input(s): TROPONINI in the last 168 hours. ------------------------------------------------------------------------------------------------------------------  RADIOLOGY:  DG Chest 2 View  Result Date: 10/09/2019 CLINICAL DATA:  Suspected sepsis EXAM: CHEST - 2 VIEW COMPARISON:  Chest radiograph dated 01/17/2019 FINDINGS: The heart size and mediastinal contours are within normal limits. There is a mild bibasilar atelectasis/airspace disease. There is no significant pleural effusion or pneumothorax. The visualized skeletal structures are unremarkable. IMPRESSION: Mild bibasilar atelectasis/airspace disease. Electronically Signed   By: Zerita Boers M.D.   On: 10/07/2019 19:35      IMPRESSION AND PLAN:   1.  Sepsis without severe sepsis or septic shock, manifested by fever and significant cytosis with elevated lactic acid level.  This is likely secondary to UTI. -The patient will be  admitted to an ICU bed.  -We will continue IV antibiotic therapy with IV Rocephin. -Continue hydration with IV normal saline. -We will follow blood and urine cultures.  2.  Anaphylaxis with anaphylactic shock likely adverse drug reaction to IV Rocephin.  I doubt that this is related to Bactrim.  His hypotension could also be a manifestation of sepsis but more than likely due to an allergic reaction. -The patient will be admitted to an ICU bed. -We will continue management of anaphylaxis with IV Solu-Medrol, IV Benadryl and Pepcid. -Given wide open normal saline bolus -We ordered IV Levophed for blood pressure support. -I notified the E- link intensivist about the patient.  2.  Paroxysmal atrial fibrillation. -We will obtain EKG. -We will continue Eliquis.  3.  History of hypertension. -We will hold off Lopressor pending improvement of blood pressure.  4.  Dementia. -We will continue Exelon.  5.  DVT prophylaxis. -Continue Eliquis.  All the records are reviewed and case discussed with ED provider. The plan of care was discussed in details with the patient (and family). I answered all questions. The patient agreed to proceed with the above mentioned plan. Further management will depend upon hospital course.   CODE STATUS: Full code.  The patient is admitted to inpatient status due to the intensity of medical problems, potential risks involved and management requirement that will necessitate more than 2 midnights.  Authorized and performed by: Eugenie Norrie, MD  Total critical care time: Approximately   60    minutes. Due to a high probability of clinically significant, life-threatening deterioration, the patient required my highest level of preparedness to intervene emergently and I personally spent this critical care time directly and personally managing the patient.  This critical care time included obtaining a history, examining the patient, pulse oximetry, ordering and review of  studies, arranging urgent treatment with development of management plan, evaluation of patient's response to treatment, frequent reassessment, and discussions with other providers. This critical care time was performed to assess and manage the high probability of imminent, life-threatening deterioration that could result in multiorgan failure.  It was exclusive of separately billable procedures and treating other patients and teaching time.  Please see MDM section and the rest of the note for further information on patient assessment and treatment.    Christel Mormon M.D on 10/17/2019 at 10:55 PM  [facility]   From 7 PM-7 AM, contact night-coverage [email or web address]  CC: Primary care physician; Albina Billet, MD   Note: This dictation was prepared with Dragon dictation along with smaller phrase technology. Any transcriptional errors that result from this process are unintentional.

## 2019-10-13 NOTE — ED Notes (Signed)
Altha Harm, niece from [place], called for update, permission obtained from pt spouse, family updated for 10 mins with results and POC

## 2019-10-13 NOTE — ED Notes (Signed)
Pt returned from [facility] att

## 2019-10-13 NOTE — ED Notes (Signed)
In and out not done pt urinated in urinal in room 2, pt soiled diaper changed

## 2019-10-14 ENCOUNTER — Inpatient Hospital Stay: Payer: Medicare Other

## 2019-10-14 DIAGNOSIS — F03918 Unspecified dementia, unspecified severity, with other behavioral disturbance: Secondary | ICD-10-CM | POA: Insufficient documentation

## 2019-10-14 DIAGNOSIS — E872 Acidosis, unspecified: Secondary | ICD-10-CM | POA: Insufficient documentation

## 2019-10-14 DIAGNOSIS — J181 Lobar pneumonia, unspecified organism: Secondary | ICD-10-CM

## 2019-10-14 DIAGNOSIS — N3000 Acute cystitis without hematuria: Secondary | ICD-10-CM

## 2019-10-14 DIAGNOSIS — R6521 Severe sepsis with septic shock: Secondary | ICD-10-CM

## 2019-10-14 DIAGNOSIS — A419 Sepsis, unspecified organism: Principal | ICD-10-CM

## 2019-10-14 DIAGNOSIS — F0391 Unspecified dementia with behavioral disturbance: Secondary | ICD-10-CM

## 2019-10-14 DIAGNOSIS — T782XXS Anaphylactic shock, unspecified, sequela: Secondary | ICD-10-CM

## 2019-10-14 DIAGNOSIS — R338 Other retention of urine: Secondary | ICD-10-CM

## 2019-10-14 LAB — CBC
HCT: 36.8 % — ABNORMAL LOW (ref 39.0–52.0)
Hemoglobin: 12.3 g/dL — ABNORMAL LOW (ref 13.0–17.0)
MCH: 30.1 pg (ref 26.0–34.0)
MCHC: 33.4 g/dL (ref 30.0–36.0)
MCV: 90.2 fL (ref 80.0–100.0)
Platelets: 173 10*3/uL (ref 150–400)
RBC: 4.08 MIL/uL — ABNORMAL LOW (ref 4.22–5.81)
RDW: 13.4 % (ref 11.5–15.5)
WBC: 26.8 10*3/uL — ABNORMAL HIGH (ref 4.0–10.5)
nRBC: 0 % (ref 0.0–0.2)

## 2019-10-14 LAB — BASIC METABOLIC PANEL
Anion gap: 5 (ref 5–15)
BUN: 27 mg/dL — ABNORMAL HIGH (ref 8–23)
CO2: 22 mmol/L (ref 22–32)
Calcium: 7.8 mg/dL — ABNORMAL LOW (ref 8.9–10.3)
Chloride: 112 mmol/L — ABNORMAL HIGH (ref 98–111)
Creatinine, Ser: 1.15 mg/dL (ref 0.61–1.24)
GFR calc Af Amer: >60 mL/min (ref >60)
GFR calc non Af Amer: 57 mL/min — ABNORMAL LOW (ref >60)
Glucose, Bld: 198 mg/dL — ABNORMAL HIGH (ref 70–99)
Potassium: 3.4 mmol/L — ABNORMAL LOW (ref 3.5–5.1)
Sodium: 139 mmol/L (ref 135–145)

## 2019-10-14 LAB — LACTIC ACID, PLASMA
Lactic Acid, Venous: 2.6 mmol/L (ref 0.5–1.9)
Lactic Acid, Venous: 2.7 mmol/L (ref 0.5–1.9)

## 2019-10-14 LAB — PROCALCITONIN: Procalcitonin: 2.77 ng/mL

## 2019-10-14 LAB — APTT: aPTT: 43 seconds — ABNORMAL HIGH (ref 24–36)

## 2019-10-14 MED ORDER — SODIUM CHLORIDE 0.9 % IV SOLN
1.0000 g | Freq: Two times a day (BID) | INTRAVENOUS | Status: DC
  Administered 2019-10-14 (×2): 1 g via INTRAVENOUS
  Filled 2019-10-14 (×2): qty 1

## 2019-10-14 MED ORDER — CALCIUM CHLORIDE 10 % IV SOLN
INTRAVENOUS | Status: AC
  Filled 2019-10-14: qty 10

## 2019-10-14 MED ORDER — QUETIAPINE FUMARATE 25 MG PO TABS: 25.0000 mg | ORAL_TABLET | Freq: Every day | ORAL | Status: DC

## 2019-10-14 MED ORDER — SODIUM CHLORIDE 0.9 % IV SOLN: 1.0000 g | INTRAVENOUS | Status: DC

## 2019-10-14 MED ORDER — DOXYCYCLINE HYCLATE 100 MG PO TABS
100.0000 mg | ORAL_TABLET | Freq: Two times a day (BID) | ORAL | Status: DC
  Administered 2019-10-14: 100 mg via ORAL
  Filled 2019-10-14: qty 1

## 2019-10-14 MED ORDER — AMIODARONE HCL IN DEXTROSE 360-4.14 MG/200ML-% IV SOLN: 60.0000 mg/h | INTRAVENOUS | Status: DC

## 2019-10-14 MED ORDER — LACTATED RINGERS IV BOLUS
1000.0000 mL | Freq: Once | INTRAVENOUS | Status: AC
  Administered 2019-10-14: 1000 mL via INTRAVENOUS

## 2019-10-14 MED ORDER — POTASSIUM CHLORIDE 10 MEQ/100ML IV SOLN
10.0000 meq | INTRAVENOUS | Status: AC
  Administered 2019-10-14 (×2): 10 meq via INTRAVENOUS
  Filled 2019-10-14 (×2): qty 100

## 2019-10-14 MED ORDER — EPINEPHRINE 1 MG/10ML IJ SOSY
PREFILLED_SYRINGE | INTRAMUSCULAR | Status: AC
  Filled 2019-10-14: qty 30

## 2019-10-14 MED ORDER — LACTATED RINGERS IV SOLN: INTRAVENOUS | Status: DC

## 2019-10-14 MED ORDER — EPINEPHRINE 1 MG/10ML IJ SOSY
PREFILLED_SYRINGE | INTRAMUSCULAR | Status: DC | PRN
  Administered 2019-10-14 (×2): 1 via INTRAVENOUS

## 2019-10-14 MED ORDER — AMIODARONE HCL IN DEXTROSE 360-4.14 MG/200ML-% IV SOLN: 30.0000 mg/h | INTRAVENOUS | Status: DC

## 2019-10-14 MED ORDER — IPRATROPIUM-ALBUTEROL 0.5-2.5 (3) MG/3ML IN SOLN
3.0000 mL | Freq: Four times a day (QID) | RESPIRATORY_TRACT | Status: DC
  Administered 2019-10-14: 13:00:00 3 mL via RESPIRATORY_TRACT
  Filled 2019-10-14: qty 3

## 2019-10-14 MED ORDER — AMIODARONE LOAD VIA INFUSION: 150.0000 mg | Freq: Once | INTRAVENOUS | Status: DC

## 2019-10-14 MED ORDER — FINASTERIDE 5 MG PO TABS
5.0000 mg | ORAL_TABLET | Freq: Every day | ORAL | Status: DC
  Administered 2019-10-14: 5 mg via ORAL
  Filled 2019-10-14 (×2): qty 1

## 2019-10-14 MED ORDER — CALCIUM GLUCONATE 10 % IV SOLN
INTRAVENOUS | Status: AC
  Filled 2019-10-14: qty 10

## 2019-10-14 MED ORDER — BUDESONIDE 0.5 MG/2ML IN SUSP
0.5000 mg | Freq: Two times a day (BID) | RESPIRATORY_TRACT | Status: DC
  Administered 2019-10-14: 0.5 mg via RESPIRATORY_TRACT
  Filled 2019-10-14 (×3): qty 2

## 2019-10-14 MED ORDER — LACTATED RINGERS IV BOLUS: 1000.0000 mL | Freq: Once | INTRAVENOUS | Status: DC

## 2019-10-14 MED ORDER — APIXABAN 5 MG PO TABS
5.0000 mg | ORAL_TABLET | Freq: Two times a day (BID) | ORAL | Status: DC
  Administered 2019-10-14: 5 mg via ORAL
  Filled 2019-10-14: qty 1

## 2019-10-15 LAB — URINE CULTURE: Culture: NO GROWTH

## 2019-10-18 LAB — CULTURE, BLOOD (ROUTINE X 2)
Culture: NO GROWTH
Culture: NO GROWTH

## 2019-10-18 NOTE — ED Notes (Signed)
Assumed recorder from kate, rn Code blue as follows: 1918- pulse check, asystole, one amp calcium and one amp bicarb given, one amp epi given 1921 one amp epi given 1922- pulse check aysystole,  1924- one amp epi,given, pt intubated with 8.0 ett by dr. Joan Mayans, 24 at lip, pulse check asystole 1926- one amp bicarb given 1927- one amp of epi given, pulse check pea 1928-one amp of calcium given 1929- one amp of epi given 1930- pulse check, pea 1932- one amp of epi given 1933- pulse check PEA 1935- one amp of epi given 1936-pulse check, pea, time of death 1936 Compression continued with exception of 5 seconds for each pulse check

## 2019-10-18 NOTE — Progress Notes (Signed)
Patient ID: Jeffery [name], male   DOB: January 18, 1931, 86 y.o.   MRN: [id number] [facility] PROGRESS NOTE  Jeffery [name] [id number] DOB: 1931-07-17 DOA: 09/22/2019 PCP: Albina Billet, MD  HPI/Subjective: Patient a little agitated but able to focus.  Patient answers a few yes or no questions.  States he does not feel good but feeling better.  Objective: Vitals:   10-16-19 1000 October 16, 2019 1130  BP: 108/76 (!) 134/91  Pulse: 95 (!) 123  Resp: 19 (!) 21  Temp:    SpO2: 98% 100%    Intake/Output Summary (Last 24 hours) at 10/16/19 1158 Last data filed at 2019/10/16 1028 Gross per 24 hour  Intake 4130.99 ml  Output 350 ml  Net 3780.99 ml   Filed Weights   10/04/2019 1854  Weight: 64.8 kg    ROS: Review of Systems  Unable to perform ROS: Acuity of condition  Respiratory: Positive for cough and shortness of breath.   Cardiovascular: Negative for chest pain.  Gastrointestinal: Negative for abdominal pain.   Exam: Physical Exam  HENT:  Nose: No mucosal edema.  Mouth/Throat: No oropharyngeal exudate or posterior oropharyngeal edema.  Eyes: Conjunctivae and lids are normal.  Cardiovascular: S1 normal and S2 normal. Exam reveals no gallop.  No murmur heard. Respiratory: No respiratory distress. He has decreased breath sounds in the right middle field, the right lower field, the left middle field and the left lower field. He has no wheezes. He has rhonchi in the right lower field and the left lower field. He has no rales.  GI: Soft. Bowel sounds are normal. There is no abdominal tenderness.  Musculoskeletal:     Right ankle: No swelling.     Left ankle: No swelling.  Lymphadenopathy:    He has no cervical adenopathy.  Neurological: He is alert.  Moves his extremities on his own  Skin: Skin is warm. Nails show no clubbing.  Maculopapular rash on the chest  Psychiatric: He is agitated.      Data Reviewed: Basic Metabolic Panel: Recent Labs  Lab 09/23/2019 1858  2019/10/16 0457  NA 139 139  K 3.4* 3.4*  CL 106 112*  CO2 23 22  GLUCOSE 132* 198*  BUN 31* 27*  CREATININE 1.38* 1.15  CALCIUM 8.7* 7.8*   Liver Function Tests: Recent Labs  Lab 09/17/2019 1858  AST 20  ALT 15  ALKPHOS 48  BILITOT 0.9  PROT 6.6  ALBUMIN 4.0   CBC: Recent Labs  Lab 10/16/2019 1858 October 16, 2019 0457  WBC 21.3* 26.8*  NEUTROABS 20.1*  --   HGB 13.2 12.3*  HCT 40.0 36.8*  MCV 90.5 90.2  PLT 164 173   BNP (last 3 results) Recent Labs    01/17/19 1744  BNP 741.0*      Recent Results (from the past 240 hour(s))  Culture, blood (Routine x 2)     Status: None (Preliminary result)   Collection Time: 10/12/2019  6:59 PM   Specimen: BLOOD  Result Value Ref Range Status   Specimen Description BLOOD LEFT ASSIST CONTROL  Final   Special Requests   Final    BOTTLES DRAWN AEROBIC AND ANAEROBIC Blood Culture results may not be optimal due to an inadequate volume of blood received in culture bottles   Culture   Final    NO GROWTH < 12 HOURS Performed at [facility] [place], [address] [place], [place] [address]    Report Status PENDING  Incomplete  Culture, blood (Routine x 2)  Status: None (Preliminary result)   Collection Time: 09/25/2019  6:59 PM   Specimen: BLOOD  Result Value Ref Range Status   Specimen Description BLOOD LEFT HAND  Final   Special Requests   Final    BOTTLES DRAWN AEROBIC AND ANAEROBIC Blood Culture results may not be optimal due to an inadequate volume of blood received in culture bottles   Culture   Final    NO GROWTH < 12 HOURS Performed at [facility], [address], [place], [place] [address]    Report Status PENDING  Incomplete  Respiratory Panel by RT PCR (Flu A&B, Covid) - Nasopharyngeal Swab     Status: None   Collection Time: 10/15/2019  9:33 PM   Specimen: Nasopharyngeal Swab  Result Value Ref Range Status   SARS Coronavirus 2 by RT PCR NEGATIVE NEGATIVE Final    Comment: (NOTE) SARS-CoV-2 target nucleic  acids are NOT DETECTED. The SARS-CoV-2 RNA is generally detectable in upper respiratoy specimens during the acute phase of infection. The lowest concentration of SARS-CoV-2 viral copies this assay can detect is 131 copies/mL. A negative result does not preclude SARS-Cov-2 infection and should not be used as the sole basis for treatment or other patient management decisions. A negative result may occur with  improper specimen collection/handling, submission of specimen other than nasopharyngeal swab, presence of viral mutation(s) within the areas targeted by this assay, and inadequate number of viral copies (<131 copies/mL). A negative result must be combined with clinical observations, patient history, and epidemiological information. The expected result is Negative. Fact Sheet for Patients:  PinkCheek.be Fact Sheet for Healthcare Providers:  GravelBags.it This test is not yet ap proved or cleared by the [place] FDA and  has been authorized for detection and/or diagnosis of SARS-CoV-2 by FDA under an Emergency Use Authorization (EUA). This EUA will remain  in effect (meaning this test can be used) for the duration of the COVID-19 declaration under Section 564(b)(1) of the Act, 21 U.S.C. section 360bbb-3(b)(1), unless the authorization is terminated or revoked sooner.    Influenza A by PCR NEGATIVE NEGATIVE Final   Influenza B by PCR NEGATIVE NEGATIVE Final    Comment: (NOTE) The Xpert Xpress SARS-CoV-2/FLU/RSV assay is intended as an aid in  the diagnosis of influenza from Nasopharyngeal swab specimens and  should not be used as a sole basis for treatment. Nasal washings and  aspirates are unacceptable for Xpert Xpress SARS-CoV-2/FLU/RSV  testing. Fact Sheet for Patients: PinkCheek.be Fact Sheet for Healthcare Providers: GravelBags.it This test is not yet  approved or cleared by the [place] FDA and  has been authorized for detection and/or diagnosis of SARS-CoV-2 by  FDA under an Emergency Use Authorization (EUA). This EUA will remain  in effect (meaning this test can be used) for the duration of the  Covid-19 declaration under Section 564(b)(1) of the Act, 21  U.S.C. section 360bbb-3(b)(1), unless the authorization is  terminated or revoked. Performed at [facility], [address] [place], [place] [address]      Studies: DG Chest 2 View  Result Date: 09/18/2019 CLINICAL DATA:  Suspected sepsis EXAM: CHEST - 2 VIEW COMPARISON:  Chest radiograph dated 01/17/2019 FINDINGS: The heart size and mediastinal contours are within normal limits. There is a mild bibasilar atelectasis/airspace disease. There is no significant pleural effusion or pneumothorax. The visualized skeletal structures are unremarkable. IMPRESSION: Mild bibasilar atelectasis/airspace disease. Electronically Signed   By: Zerita Boers M.D.   On: 10/05/2019 19:35  Scheduled Meds: . apixaban  5 mg Oral BID  . budesonide (PULMICORT) nebulizer solution  0.5 mg Nebulization BID  . diphenhydrAMINE  25 mg Intravenous Q6H  . ipratropium-albuterol  3 mL Nebulization Q6H  . methylPREDNISolone (SOLU-MEDROL) injection  60 mg Intravenous Q12H  . QUEtiapine  25 mg Oral QHS  . rivastigmine  1.5 mg Oral BID   Continuous Infusions: . sodium chloride 100 mL/hr at 10-29-19 0703  . sodium chloride    . famotidine (PEPCID) IV    . lactated ringers    . meropenem (MERREM) IV Stopped (Oct 29, 2019 1028)  . norepinephrine (LEVOPHED) Adult infusion 4 mcg/min (10-29-19 0703)    Assessment/Plan:  1. Septic shock with acute kidney injury.  Bilateral pneumonia and positive urinary infection.  Patient on Levophed to maintain blood pressure.  Patient on meropenem, Solu-Medrol, IV fluid.  Follow-up cultures.  We will add doxycycline. 2. Anaphylaxis possibly to Rocephin.  The drug  rash on the chest could be related to Bactrim.  Either way patient on steroids and Levophed at this time. 3. Lactic acidosis.  IV fluids 4. Urinary retention.  Urology called to place Foley catheter. 5. Atrial fibrillation with rapid ventricular response on metoprolol and Eliquis 6. Dementia with behavioral disturbance and agitation.  Will give Seroquel at night.  Continue rivastigmine  Code Status:     Code Status Orders  (From admission, onward)         Start     Ordered   09/17/2019 2243  Full code  Continuous     10/13/19 2254        Code Status History    Date Active Date Inactive Code Status Order ID Comments User Context   01/17/2019 2231 01/20/2019 1516 Full Code [id number]  Henreitta Leber, MD Inpatient   07/13/2018 0007 07/17/2018 1714 Full Code [id number]  Jules Husbands, MD Inpatient   Advance Care Planning Activity     Family Communication: Spoke with wife on the phone Disposition Plan: Patient currently on Levophed to maintain blood pressure likely will need at least 4 days here in the hospital in order to stabilize things prior to discharge.  Still requires stepdown status.  Consultants:  Critical care specialist  Urology  Antibiotics:  Meropenem  Doxycycline  Time spent: 28 minutes  [place]

## 2019-10-18 NOTE — Progress Notes (Signed)
CODE SEPSIS - PHARMACY COMMUNICATION  **Broad Spectrum Antibiotics should be administered within 1 hour of Sepsis diagnosis**  Time Code Sepsis Called/Page Received: 2257  Antibiotics Ordered: ceftriaxone/levaquin started in place of ceftriaxone  Time of 1st antibiotic administration: 2003  Additional action taken by pharmacy:   If necessary, Name of Provider/Nurse Contacted:     Tobie Lords ,PharmD Clinical Pharmacist  2019-10-30  12:33 AM

## 2019-10-18 NOTE — ED Notes (Signed)
RN requested Nurse Secretary to call transport to bring a new Urology cart to ED due to Cystoscopy cord missing.

## 2019-10-18 NOTE — ED Notes (Addendum)
[facility] called.  REF [id number] (Not a donor candidate.)

## 2019-10-18 NOTE — Code Documentation (Signed)
No pulses present. CPR resumed.

## 2019-10-18 NOTE — Consult Note (Signed)
Name: Jeffery [name] MRN: [id number] DOB: 09-May-1931    ADMISSION DATE:  10/13/2019 CONSULTATION DATE: 2019-10-26  REFERRING MD : Dr. Sidney Ace   CHIEF COMPLAINT: UTI   BRIEF PATIENT DESCRIPTION:  86 yo male admitted with urosepsis requiring levophed gtt   SIGNIFICANT EVENTS/STUDIES:  03/27: Pt initially admitted to the medsurg unit with a UTI per hospitalist team  03/28: While in the ER pending medsurg bed availability pt became hypotensive requiring levophed gtt and status changed to ICU PCCM team contacted to assume care   HISTORY OF PRESENT ILLNESS:  This is a 86 yo male with a PMH of Pneumonia, Hyperlipidemia, Empyema, Alzheimer's Dementia, Aortic Valve Disease, Paroxysmal Atrial Fibrillation (on Eliquis), and Heart Murmur.  He presented to [facility] ER on 03/27 via EMS from the [place] with a UTI. Per ER notes pt received 2 doses of sulfa antibiotic without improvement in symptoms. ER lab results revealed K+ 3.4, glucose 132, BUN 31, creatinine 1.38, calcium 8.7, lactic acid 2.8, wbc 21.3, PT 17.6, INR 1.5, and Influenza PCR/COVID-19 negative.  CXR concerning for mild bibasilar atelectasis/airspace disease.  UA positive for UTI.  He received ceftriaxone, however following administration upon assessment of trunk pt found to have development of scattered maculopapular rash although it is uncertain if this was present prior to abx administration.  He was initially admitted to the [facility] unit per hospitalist team, however he later developed hypotension requiring levophed gtt.  PCCM team consulted to assist with management.   PAST MEDICAL HISTORY :   has a past medical history of Does use hearing aid, Empyema lung ([place]) (06/03/2016), Hyperlipidemia, and Pneumonia (Nov/Dec 2017).  has a past surgical history that includes Tonsillectomy (1944); Appendectomy (1971); Colonoscopy (2014); Hernia repair (Left, 2008); Eye surgery; Inguinal hernia repair (Right, 12/01/2016); Insertion of mesh  (Right, 12/01/2016); Green light laser turp (transurethral resection of prostate (N/A, 06/20/2018); and Inguinal hernia repair (Left, 07/12/2018). Prior to Admission medications   Medication Sig Start Date End Date Taking? Authorizing Provider  apixaban (ELIQUIS) 5 MG TABS tablet Take 1 tablet (5 mg total) by mouth 2 (two) times daily. 01/20/19  Yes Ojie, Jude, MD  metoprolol tartrate (LOPRESSOR) 25 MG tablet Take 1 tablet (25 mg total) by mouth 2 (two) times daily. 01/20/19  Yes Ojie, Jude, MD  rivastigmine (EXELON) 1.5 MG capsule Take 1.5 mg by mouth 2 (two) times daily. 09/16/19  Yes [provider]  sulfamethoxazole-trimethoprim (BACTRIM DS) 800-160 MG tablet Take 1 tablet by mouth every 12 (twelve) hours. 10/12/19  Yes [provider]   No Known Allergies  FAMILY HISTORY:  family history includes ALS in his sister and sister; Cancer in his sister; Heart attack in his father. SOCIAL HISTORY:  reports that he has quit smoking. His smoking use included cigarettes. He has a 10.00 pack-year smoking history. He has never used smokeless tobacco. He reports current alcohol use. He reports that he does not use drugs.  REVIEW OF SYSTEMS:   Unable to assess pt confused   SUBJECTIVE:  Unable to assess pt confused   VITAL SIGNS: Temp:  [99.7 F (37.6 C)-102.4 F (39.1 C)] 100 F (37.8 C) (03/28 0038) Pulse Rate:  [68-102] 88 (03/28 0145) Resp:  [17-29] 20 (03/28 0145) BP: (68-121)/(42-90) 80/48 (03/28 0145) SpO2:  [78 %-100 %] 100 % (03/28 0145) Weight:  [64.8 kg] 64.8 kg (03/27 1854)  PHYSICAL EXAMINATION: General: elderly male resting in bed, NAD  Neuro: awake, confused, follows commands, PERRLA HEENT: supple, no JVD  Cardiovascular: sinus tach, no R/G  Lungs: clear throughout, even, non labored  Abdomen: +BS x4, obese, non tender, non distended  Musculoskeletal: normal bulk and tone, no edema Skin: scattered maculopapular rash present on trunk   Recent Labs  Lab  10/17/2019 1858  NA 139  K 3.4*  CL 106  CO2 23  BUN 31*  CREATININE 1.38*  GLUCOSE 132*   Recent Labs  Lab 10/08/2019 1858  HGB 13.2  HCT 40.0  WBC 21.3*  PLT 164   DG Chest 2 View  Result Date: 09/23/2019 CLINICAL DATA:  Suspected sepsis EXAM: CHEST - 2 VIEW COMPARISON:  Chest radiograph dated 01/17/2019 FINDINGS: The heart size and mediastinal contours are within normal limits. There is a mild bibasilar atelectasis/airspace disease. There is no significant pleural effusion or pneumothorax. The visualized skeletal structures are unremarkable. IMPRESSION: Mild bibasilar atelectasis/airspace disease. Electronically Signed   By: Zerita Boers M.D.   On: 10/13/2019 19:35    ASSESSMENT / PLAN:  Hypotension secondary to urosepsis  Acute urinary tract retention  Lactic acidosis Hypokalemia  Possible adverse reaction following administration of ceftriaxone  Type II diabetes mellitus  Hx: Alzheimer's Dementia  Continuous telemetry monitoring  Trend WBC and monitor fever curve  Trend PCT and lactic acid  Follow cultures Will start meropenum due to possible ceftriaxone allergy Aggressive fluid resuscitation and prn levophed gtt to maintain map >65 Continue NS @100  ml/hr  Urology consulted for foley catheter placement appreciate input  Prn bladder scan  Continue scheduled benadryl and solumedrol   [name], [place] Pager [phone] (please enter 7 digits) PCCM Consult Pager [phone] (please enter 7 digits)

## 2019-10-18 NOTE — ED Notes (Signed)
Breathing treatment completed   Pt complaining of needing to take a BM and doesn't understand he cant stand without help. Tech will communicate concern with RN about the possibility of a two person approach to stand to toilet   lw edt

## 2019-10-18 NOTE — Death Summary Note (Signed)
DEATH SUMMARY   Patient Details  Name: Jeffery [name] MRN: [id number] DOB: 07-May-1931  Admission/Discharge Information   Admit Date:  10-15-19  Date of Death:  10/16/19  Time of Death:  1934/10/12  Length of Stay: 1  Referring Physician: Albina Billet, MD   Reason(s) for Hospitalization  Urinary Tract Infection   Diagnoses  Preliminary cause of death: Acute respiratory failure ([place]) Secondary Diagnoses (including complications and co-morbidities):  Active Problems:   Severe sepsis with septic shock (HCC)   Anaphylactic reaction   Hypotension secondary to urosepsis    Lactic acidosis    Hypokalemia    Pulmonary edema    Possible adverse reaction following administration of ceftriaxone    Type II diabetes mellitus    Acute urinary tract retention    Alzheimer's dementia   Brief Hospital Course (including significant findings, care, treatment, and services provided and events leading to death)  Jeffery [name] is a 86 y.o. year old male who presented to [facility] ER on 2019/10/15 via EMS from the [place] with a UTI. Per ER notes pt received 2 doses of sulfa antibiotic without improvement in symptoms. ER lab results revealed K+ 3.4, glucose 132, BUN 31, creatinine 1.38, calcium 8.7, lactic acid 2.8, wbc 21.3, PT 17.6, INR 1.5, and Influenza PCR/COVID-19 negative.  CXR concerning for mild bibasilar atelectasis/airspace disease.  UA positive for UTI.  He received ceftriaxone, however following administration upon assessment of trunk pt found to have development of scattered maculopapular rash although it is uncertain if this was present prior to abx administration.  He was initially admitted to the [facility] unit per hospitalist team, however he later developed hypotension requiring levophed gtt.  Therefore, pt transferred to ICU but remained in the ER pending ICU bed availability. On 10/16/2019 pt developed acute hypoxic respiratory failure and subsequently cardiac arrested at 1918,  CPR/ACLS protocol initiated along with mechanical intubation. Pts wife contacted via telephone regarding decline in pt condition. Despite aggressive treatment unable to obtain ROSC, time of death 10-12-1934 on 10/16/19.  Pts wife and caregiver arrived at hospital and were informed pt expired, support provided   Pertinent Labs and Studies  Significant Diagnostic Studies DG Chest 2 View  Result Date: 15-Oct-2019 CLINICAL DATA:  Suspected sepsis EXAM: CHEST - 2 VIEW COMPARISON:  Chest radiograph dated 01/17/2019 FINDINGS: The heart size and mediastinal contours are within normal limits. There is a mild bibasilar atelectasis/airspace disease. There is no significant pleural effusion or pneumothorax. The visualized skeletal structures are unremarkable. IMPRESSION: Mild bibasilar atelectasis/airspace disease. Electronically Signed   By: Zerita Boers M.D.   On: 10/15/19 19:35    Microbiology Recent Results (from the past 240 hour(s))  Culture, blood (Routine x 2)     Status: None (Preliminary result)   Collection Time: October 15, 2019  6:59 PM   Specimen: BLOOD  Result Value Ref Range Status   Specimen Description BLOOD LEFT ASSIST CONTROL  Final   Special Requests   Final    BOTTLES DRAWN AEROBIC AND ANAEROBIC Blood Culture results may not be optimal due to an inadequate volume of blood received in culture bottles   Culture   Final    NO GROWTH < 12 HOURS Performed at [facility], [address] [place], [place] [address]    Report Status PENDING  Incomplete  Culture, blood (Routine x 2)     Status: None (Preliminary result)   Collection Time: 10/15/19  6:59 PM   Specimen: BLOOD  Result Value  Ref Range Status   Specimen Description BLOOD LEFT HAND  Final   Special Requests   Final    BOTTLES DRAWN AEROBIC AND ANAEROBIC Blood Culture results may not be optimal due to an inadequate volume of blood received in culture bottles   Culture   Final    NO GROWTH < 12 HOURS Performed at [facility], [address], [place], [place] [address]    Report Status PENDING  Incomplete  Respiratory Panel by RT PCR (Flu A&B, Covid) - Nasopharyngeal Swab     Status: None   Collection Time: 09/22/2019  9:33 PM   Specimen: Nasopharyngeal Swab  Result Value Ref Range Status   SARS Coronavirus 2 by RT PCR NEGATIVE NEGATIVE Final    Comment: (NOTE) SARS-CoV-2 target nucleic acids are NOT DETECTED. The SARS-CoV-2 RNA is generally detectable in upper respiratoy specimens during the acute phase of infection. The lowest concentration of SARS-CoV-2 viral copies this assay can detect is 131 copies/mL. A negative result does not preclude SARS-Cov-2 infection and should not be used as the sole basis for treatment or other patient management decisions. A negative result may occur with  improper specimen collection/handling, submission of specimen other than nasopharyngeal swab, presence of viral mutation(s) within the areas targeted by this assay, and inadequate number of viral copies (<131 copies/mL). A negative result must be combined with clinical observations, patient history, and epidemiological information. The expected result is Negative. Fact Sheet for Patients:  PinkCheek.be Fact Sheet for Healthcare Providers:  GravelBags.it This test is not yet ap proved or cleared by the [place] FDA and  has been authorized for detection and/or diagnosis of SARS-CoV-2 by FDA under an Emergency Use Authorization (EUA). This EUA will remain  in effect (meaning this test can be used) for the duration of the COVID-19 declaration under Section 564(b)(1) of the Act, 21 U.S.C. section 360bbb-3(b)(1), unless the authorization is terminated or revoked sooner.    Influenza A by PCR NEGATIVE NEGATIVE Final   Influenza B by PCR NEGATIVE NEGATIVE Final    Comment: (NOTE) The Xpert Xpress SARS-CoV-2/FLU/RSV assay is intended as an aid in   the diagnosis of influenza from Nasopharyngeal swab specimens and  should not be used as a sole basis for treatment. Nasal washings and  aspirates are unacceptable for Xpert Xpress SARS-CoV-2/FLU/RSV  testing. Fact Sheet for Patients: PinkCheek.be Fact Sheet for Healthcare Providers: GravelBags.it This test is not yet approved or cleared by the [place] FDA and  has been authorized for detection and/or diagnosis of SARS-CoV-2 by  FDA under an Emergency Use Authorization (EUA). This EUA will remain  in effect (meaning this test can be used) for the duration of the  Covid-19 declaration under Section 564(b)(1) of the Act, 21  U.S.C. section 360bbb-3(b)(1), unless the authorization is  terminated or revoked. Performed at [facility], [address] [place], [place] [address]     Lab Basic Metabolic Panel: Recent Labs  Lab 09/27/2019 1858 11-05-2019 0457  NA 139 139  K 3.4* 3.4*  CL 106 112*  CO2 23 22  GLUCOSE 132* 198*  BUN 31* 27*  CREATININE 1.38* 1.15  CALCIUM 8.7* 7.8*   Liver Function Tests: Recent Labs  Lab 10/02/2019 1858  AST 20  ALT 15  ALKPHOS 48  BILITOT 0.9  PROT 6.6  ALBUMIN 4.0   No results for input(s): LIPASE, AMYLASE in the last 168 hours. No results for input(s): AMMONIA in the last 168 hours. CBC:  Recent Labs  Lab 09/22/2019 1858 Oct 15, 2019 0457  WBC 21.3* 26.8*  NEUTROABS 20.1*  --   HGB 13.2 12.3*  HCT 40.0 36.8*  MCV 90.5 90.2  PLT 164 173   Cardiac Enzymes: No results for input(s): CKTOTAL, CKMB, CKMBINDEX, TROPONINI in the last 168 hours. Sepsis Labs: Recent Labs  Lab 09/21/2019 1858 10/16/2019 2133 10-15-2019 0457 2019/10/15 0857  PROCALCITON  --   --  2.77  --   WBC 21.3*  --  26.8*  --   LATICACIDVEN 2.8* 3.2* 2.6* 2.7*    Procedures/Operations  Mechanical Intubation   [name], [place] Pager [phone] (please enter 7  digits) PCCM Consult Pager [phone] (please enter 7 digits)

## 2019-10-18 NOTE — ED Notes (Signed)
Pt trying to stand. Pt redirected back into the bed. Pt fed breakfast tray. He ate 40% of meal and 80% of orange juice. Pt wet bed. Tech changed bedding and applied dry chuck and brief. Tech put bed alarm back in bed.   Pt calm and resting at moment.   lw edt

## 2019-10-18 NOTE — ED Notes (Signed)
Urine collected Urologist placed 16 french indwelling cath Assisted by tech lw

## 2019-10-18 NOTE — Consult Note (Signed)
   Urology Consult  Chief Complaint: N/A  Requesting provider: Dana Blakeney, NP  Reason for consultation: Foley catheter placement  History of Present Illness: Jeffery [name] is a 86 y.o. male with a history of hyperlipidemia, Alzheimer's dementia, aortic valvular disease and paroxysmal atrial fibrillation on Eliquis. He presented to the ED 3/27 from the [place] with altered mental status and worsening confusion over the last few days. She was on Septra for a UTI. Urinalysis in the ED remarkable for pyuria and microhematuria. He was initially admitted to a MedSurg unit by the hospitalist team however subsequently developed hypotension requiring Levophed and admission change to ICU where he is awaiting for bed.  A Foley catheter was placed however was subsequently removed as no urine was obtained and the catheter would not aspirate. He has mild urethral bleeding since catheter attempt.  Past urologic history remarkable for bladder outlet obstruction and prostate cancer managed by Dr. Wolff. He underwent PVP by Dr. Wolff December 2019. His note at that time indicated he would be referred for treatment of his prostate cancer although I do not see in the chart for in Care Everywhere where he has been treated. There were no family members present to obtain additional history. He voided 200 mL just prior to catheter placement and has not voided since that time.  Past Medical History:  Diagnosis Date  . Does use hearing aid   . Empyema lung (HCC) 06/03/2016   MODERATE WBC PRESENT, PREDOMINANTLY PMN, STREP INTERMEDIUS  . Hyperlipidemia   . Pneumonia Nov/Dec 2017    Past Surgical History:  Procedure Laterality Date  . APPENDECTOMY  1971  . COLONOSCOPY  2014  . EYE SURGERY     cataract surgery   . GREEN LIGHT LASER TURP (TRANSURETHRAL RESECTION OF PROSTATE N/A 06/20/2018   Procedure: GREEN LIGHT LASER TURP (TRANSURETHRAL RESECTION OF PROSTATE;  Surgeon: Wolff, Michael R, MD;   Location: [facility] ORS;  Service: Urology;  Laterality: N/A;  . HERNIA REPAIR Left 2008  . INGUINAL HERNIA REPAIR Right 12/01/2016   Right inguinal hernia repair with medium Ultra Pro mesh.  . INGUINAL HERNIA REPAIR Left 07/12/2018   Procedure: HERNIA REPAIR INGUINAL ADULT;  Surgeon: Pabon, Diego F, MD;  Location: [facility] ORS;  Service: General;  Laterality: Left;  . INSERTION OF MESH Right 12/01/2016   Procedure: INSERTION OF MESH;  Surgeon: Byrnett, Jeffrey W, MD;  Location: [facility] ORS;  Service: General;  Laterality: Right;  . TONSILLECTOMY  1944    Home Medications:  Current Meds  Medication Sig  . apixaban (ELIQUIS) 5 MG TABS tablet Take 1 tablet (5 mg total) by mouth 2 (two) times daily.  . metoprolol tartrate (LOPRESSOR) 25 MG tablet Take 1 tablet (25 mg total) by mouth 2 (two) times daily.  . rivastigmine (EXELON) 1.5 MG capsule Take 1.5 mg by mouth 2 (two) times daily.  . sulfamethoxazole-trimethoprim (BACTRIM DS) 800-160 MG tablet Take 1 tablet by mouth every 12 (twelve) hours.    Allergies:  Allergies  Allergen Reactions  . Ceftriaxone Rash    Family History  Problem Relation Age of Onset  . Heart attack Father   . ALS Sister   . Cancer Sister   . ALS Sister     Social History:  reports that he has quit smoking. His smoking use included cigarettes. He has a 10.00 pack-year smoking history. He has never used smokeless tobacco. He reports current alcohol use. He reports that he does not use drugs.    ROS: A complete review of systems was performed.  All systems are negative except for pertinent findings as noted.  Physical Exam:  Vital signs in last 24 hours: Temp:  [97.8 F (36.6 C)-102.4 F (39.1 C)] 97.8 F (36.6 C) (03/28 0737) Pulse Rate:  [68-123] 111 (03/28 1300) Resp:  [13-29] 26 (03/28 1300) BP: (68-140)/(42-100) 126/85 (03/28 1300) SpO2:  [78 %-100 %] 100 % (03/28 1300) Weight:  [64.8 kg] 64.8 kg (03/27 1854) Constitutional:  Alert, No acute distress HEENT: [place]  AT, moist mucus membranes.  Trachea midline, no masses Respiratory: Normal respiratory effort GI: Abdomen is soft, nontender, nondistended, no abdominal masses GU: Phallus uncircumcised with mild urethral bleeding noted. Testes descended bilaterally, atrophic without mass. Prostate deferred Skin: No rashes, bruises or suspicious lesions Neurologic: Grossly intact, no focal deficits, moving all 4 extremities  Laboratory Data:  Recent Labs    10/03/2019 1858 09/21/2019 0457  WBC 21.3* 26.8*  HGB 13.2 12.3*  HCT 40.0 36.8*   Recent Labs    10/12/2019 1858 09/28/2019 0457  NA 139 139  K 3.4* 3.4*  CL 106 112*  CO2 23 22  GLUCOSE 132* 198*  BUN 31* 27*  CREATININE 1.38* 1.15  CALCIUM 8.7* 7.8*   Recent Labs    10/12/2019 1858  INR 1.5*   No results for input(s): LABURIN in the last 72 hours. Results for orders placed or performed during the hospital encounter of 09/30/2019  Culture, blood (Routine x 2)     Status: None (Preliminary result)   Collection Time: 09/21/2019  6:59 PM   Specimen: BLOOD  Result Value Ref Range Status   Specimen Description BLOOD LEFT ASSIST CONTROL  Final   Special Requests   Final    BOTTLES DRAWN AEROBIC AND ANAEROBIC Blood Culture results may not be optimal due to an inadequate volume of blood received in culture bottles   Culture   Final    NO GROWTH < 12 HOURS Performed at [facility], [address] [place], [place] [address]    Report Status PENDING  Incomplete  Culture, blood (Routine x 2)     Status: None (Preliminary result)   Collection Time: 09/23/2019  6:59 PM   Specimen: BLOOD  Result Value Ref Range Status   Specimen Description BLOOD LEFT HAND  Final   Special Requests   Final    BOTTLES DRAWN AEROBIC AND ANAEROBIC Blood Culture results may not be optimal due to an inadequate volume of blood received in culture bottles   Culture   Final    NO GROWTH < 12 HOURS Performed at [facility], [address]  [place], [place] [address]    Report Status PENDING  Incomplete  Respiratory Panel by RT PCR (Flu A&B, Covid) - Nasopharyngeal Swab     Status: None   Collection Time: 10/13/2019  9:33 PM   Specimen: Nasopharyngeal Swab  Result Value Ref Range Status   SARS Coronavirus 2 by RT PCR NEGATIVE NEGATIVE Final    Comment: (NOTE) SARS-CoV-2 target nucleic acids are NOT DETECTED. The SARS-CoV-2 RNA is generally detectable in upper respiratoy specimens during the acute phase of infection. The lowest concentration of SARS-CoV-2 viral copies this assay can detect is 131 copies/mL. A negative result does not preclude SARS-Cov-2 infection and should not be used as the sole basis for treatment or other patient management decisions. A negative result may occur with  improper specimen collection/handling, submission of specimen other than nasopharyngeal swab, presence of viral mutation(s)   within the areas targeted by this assay, and inadequate number of viral copies (<131 copies/mL). A negative result must be combined with clinical observations, patient history, and epidemiological information. The expected result is Negative. Fact Sheet for Patients:  PinkCheek.be Fact Sheet for Healthcare Providers:  GravelBags.it This test is not yet ap proved or cleared by the [place] FDA and  has been authorized for detection and/or diagnosis of SARS-CoV-2 by FDA under an Emergency Use Authorization (EUA). This EUA will remain  in effect (meaning this test can be used) for the duration of the COVID-19 declaration under Section 564(b)(1) of the Act, 21 U.S.C. section 360bbb-3(b)(1), unless the authorization is terminated or revoked sooner.    Influenza A by PCR NEGATIVE NEGATIVE Final   Influenza B by PCR NEGATIVE NEGATIVE Final    Comment: (NOTE) The Xpert Xpress SARS-CoV-2/FLU/RSV assay is intended as an aid in  the diagnosis of influenza from  Nasopharyngeal swab specimens and  should not be used as a sole basis for treatment. Nasal washings and  aspirates are unacceptable for Xpert Xpress SARS-CoV-2/FLU/RSV  testing. Fact Sheet for Patients: PinkCheek.be Fact Sheet for Healthcare Providers: GravelBags.it This test is not yet approved or cleared by the [place] FDA and  has been authorized for detection and/or diagnosis of SARS-CoV-2 by  FDA under an Emergency Use Authorization (EUA). This EUA will remain  in effect (meaning this test can be used) for the duration of the  Covid-19 declaration under Section 564(b)(1) of the Act, 21  U.S.C. section 360bbb-3(b)(1), unless the authorization is  terminated or revoked. Performed at [facility], [address], [place], [place] [address]      Radiologic Imaging: DG Chest 2 View  Result Date: 09/27/2019 CLINICAL DATA:  Suspected sepsis EXAM: CHEST - 2 VIEW COMPARISON:  Chest radiograph dated 01/17/2019 FINDINGS: The heart size and mediastinal contours are within normal limits. There is a mild bibasilar atelectasis/airspace disease. There is no significant pleural effusion or pneumothorax. The visualized skeletal structures are unremarkable. IMPRESSION: Mild bibasilar atelectasis/airspace disease. Electronically Signed   By: Zerita Boers M.D.   On: 09/21/2019 19:35   Procedure:  External genitalia were prepped and draped. A 12 [place] Coloplast coud catheter was placed and resistance was met in the region of the bulbar urethra.  A 0.038 Sensor wire followed by a Glidewire were then passed per urethra and resistance was met in the same region.  A flexible cystoscope was then passed per urethra. The bulbar urethra was without evidence of stricture however there was acute upper angulation of the prostatic urethra and a mild bladder neck contracture estimated at >20 [place]. The cystoscope was advanced into the  bladder and the sensor wire was placed with cystoscope.   After cystoscope removal a 16 [place] catheter was placed without difficulty and inflated with 5 mL of sterile water. Approximately 200 mL of blood-tinged, slightly cloudy urine was obtained.   Impression/Assessment:  86 y.o. male with UTI and mild bladder neck contracture. A 16 French [place] catheter was placed over a wire.  Recommendation:  -Leave indwelling Foley x1 week -Outpatient follow-up with Dr. Yves Dill after discharge   2019/11/13, 1:09 PM  John Giovanni,  MD

## 2019-10-18 NOTE — ED Notes (Addendum)
Call for report, Jeffery [name] reports unable to take pt for another pt that will get orders for ICU (to call me back), charge, Annie Main notified

## 2019-10-18 NOTE — Code Documentation (Signed)
Pt being bagged by [employer]. CPR started at [place] by Mickel Baas EDT. Dr. Joan Mayans (ER MD) at bedside

## 2019-10-18 NOTE — ED Notes (Signed)
Pt ate about 20% of lunch tray Pt did not seem hungry and continued to denie food assist   lw edt

## 2019-10-18 NOTE — ED Notes (Signed)
Hinton Dyer, NP updated, orders recieved

## 2019-10-18 NOTE — ED Notes (Signed)
Since cath placed 625 output   lw edt

## 2019-10-18 NOTE — ED Notes (Signed)
RN Ali Lowe was providing report to [employer] at bedside with Nurse Mikel Cella. present. While providing report patient status drastically changed and patient became unresponsive but still had pulse. RN Ali Lowe began to bag patient however and ED MD [facility] was called to patient bedside, shortly after 1-2 minutes, patient lost pulse and CPR was initiated.

## 2019-10-18 NOTE — Consult Note (Signed)
Pharmacy Antibiotic Note  Jeffery [name] is a 86 y.o. male admitted on 09/20/2019 with UTI.  Pharmacy has been consulted for Meropenem dosing.  Plan: Meropenem 1g q12H   Height: 5\' 7"  (170.2 cm) Weight: 142 lb 13.7 oz (64.8 kg) IBW/kg (Calculated) : 66.1  Temp (24hrs), Avg:100.7 F (38.2 C), Min:99.7 F (37.6 C), Max:102.4 F (39.1 C)  Recent Labs  Lab 09/26/2019 1858 09/26/2019 2133 Oct 24, 2019 0457  WBC 21.3*  --  26.8*  CREATININE 1.38*  --  1.15  LATICACIDVEN 2.8* 3.2* 2.6*    Estimated Creatinine Clearance: 41.5 mL/min (by C-G formula based on SCr of 1.15 mg/dL).    Allergies  Allergen Reactions  . Ceftriaxone Rash    Antimicrobials this admission: 3/27 ceftriaxone x 1  3/28 meropenem >>   Dose adjustments this admission: None  Microbiology results: 3/28 BCx: pending 3/28 UCx: pending   Thank you for allowing pharmacy to be a part of this patient's care.  Oswald Hillock, PharmD, BCPS 10/24/2019 7:34 AM

## 2019-10-18 NOTE — ED Notes (Signed)
Foley flushed with 20 mls NS no return, Hinton Dyer, NP updated

## 2019-10-18 NOTE — Progress Notes (Signed)
Patient ID: Jeffery [name], male   DOB: 09/04/1930, 86 y.o.   MRN: [id number]  Tried to reach Dr. Voncille Lo at [phone] and no answer.  Was notified by patient advocate that the patient's wife did give permission to speak with him.  He is the patient's brother-in-law.  I also tried to call the number that the nurse left in the note of [phone] and left a message to call back.  Not sure which is the right phone number but I tried both.  Dr Loletha Grayer

## 2019-10-18 NOTE — ED Notes (Signed)
Dr Voncille Lo called requesting information however patient is not on contact list under patient demographics.   If consent is given Dr. Voncille Lo cell phone number is [phone]

## 2019-10-18 NOTE — ED Notes (Signed)
Marily Memos, NP at bedside

## 2019-10-18 NOTE — ED Notes (Signed)
RN and tech were able to do a two person stand to walk from bed to toliet and pt was about to have a BM. Pt BM was very large and loose.   Pt seems much more calm and less anxious now.  lw edt

## 2019-10-18 NOTE — ED Notes (Signed)
Pt received dinner tray and consumed with help from tech 80% of meal. Since pt had BM earlier in the afternoon he seems more calm. Pt still continues to pull at mittens to remove. He Is very aggravated by the mittens but otherwise very pleasant.  lw edt

## 2019-10-18 NOTE — ED Notes (Signed)
Nurse Secretary has paged Urologist so that he may be informed that equipment needed to complete procedure is at bedside at this time.

## 2019-10-18 NOTE — ED Notes (Signed)
Pt needs applesauce or soft food in order to take PO medicine. Otherwise pt will such on medicine then spit out. Also pockets medicine in mouth unknowingly   lw edt

## 2019-10-18 NOTE — ED Notes (Signed)
Third attempt for indewelling foley sucessful with initial 16 fr temp, then 16 coude, then 18coude as per order from Dr Sidney Ace

## 2019-10-18 NOTE — ED Provider Notes (Signed)
[facility] Department of Emergency Medicine   Code Blue CONSULT NOTE  Chief Complaint: Cardiac arrest/unresponsive   Level V Caveat: Unresponsive  History of present illness: I was contacted by the hospital for a CODE BLUE cardiac arrest and presented to the patient's bedside.   Patient has been boarding in ED awaiting an ICU bed, admitted due to urosepsis.   Noted by RN to have an acute change and become more tachycardic and labored breathing around 7 PM.   Became more cyanotic, placed on NRB and then found to be pulseless. Code started at 1913.  ICU APP made aware.   ROS: Unable to obtain, Level V caveat  Scheduled Meds: . calcium chloride      . calcium gluconate      . EPINEPHrine      . amiodarone  150 mg Intravenous Once  . apixaban  5 mg Oral BID  . budesonide (PULMICORT) nebulizer solution  0.5 mg Nebulization BID  . diphenhydrAMINE  25 mg Intravenous Q6H  . doxycycline  100 mg Oral Q12H  . finasteride  5 mg Oral Daily  . ipratropium-albuterol  3 mL Nebulization Q6H  . methylPREDNISolone (SOLU-MEDROL) injection  60 mg Intravenous Q12H  . QUEtiapine  25 mg Oral QHS  . rivastigmine  1.5 mg Oral BID   Continuous Infusions: . sodium chloride    . amiodarone     Followed by  . [START ON 10/15/2019] amiodarone    . famotidine (PEPCID) IV    . meropenem (MERREM) IV 1 g (10/16/19 1837)  . norepinephrine (LEVOPHED) Adult infusion Stopped (October 16, 2019 1835)   PRN Meds:.acetaminophen **OR** acetaminophen, EPINEPHrine, magnesium hydroxide, ondansetron **OR** ondansetron (ZOFRAN) IV, traZODone Past Medical History:  Diagnosis Date  . Does use hearing aid   . Empyema lung (Hydro) 06/03/2016   MODERATE WBC PRESENT, PREDOMINANTLY PMN, STREP INTERMEDIUS  . Hyperlipidemia   . Pneumonia Nov/Dec 2017   Past Surgical History:  Procedure Laterality Date  . APPENDECTOMY  1971  . COLONOSCOPY  2014  . EYE SURGERY     cataract surgery   . GREEN LIGHT LASER  TURP (TRANSURETHRAL RESECTION OF PROSTATE N/A 06/20/2018   Procedure: GREEN LIGHT LASER TURP (TRANSURETHRAL RESECTION OF PROSTATE;  Surgeon: Royston Cowper, MD;  Location: [facility] ORS;  Service: Urology;  Laterality: N/A;  . HERNIA REPAIR Left 2008  . INGUINAL HERNIA REPAIR Right 12/01/2016   Right inguinal hernia repair with medium Ultra Pro mesh.  . INGUINAL HERNIA REPAIR Left 07/12/2018   Procedure: HERNIA REPAIR INGUINAL ADULT;  Surgeon: Jules Husbands, MD;  Location: [facility] ORS;  Service: General;  Laterality: Left;  . INSERTION OF MESH Right 12/01/2016   Procedure: INSERTION OF MESH;  Surgeon: Robert Bellow, MD;  Location: [facility] ORS;  Service: General;  Laterality: Right;  . TONSILLECTOMY  1944   Social History   Socioeconomic History  . Marital status: Married    Spouse name: Not on file  . Number of children: Not on file  . Years of education: Not on file  . Highest education level: Not on file  Occupational History  . Not on file  Tobacco Use  . Smoking status: Former Smoker    Packs/day: 1.00    Years: 10.00    Pack years: 10.00    Types: Cigarettes  . Smokeless tobacco: Never Used  . Tobacco comment: quit smoking in 1970  Substance and Sexual Activity  . Alcohol use: Yes    Comment: occ 3/week  .  Drug use: No  . Sexual activity: Not on file  Other Topics Concern  . Not on file  Social History Narrative  . Not on file   Social Determinants of Health   Financial Resource Strain:   . Difficulty of Paying Living Expenses:   Food Insecurity:   . Worried About Charity fundraiser in the Last Year:   . Arboriculturist in the Last Year:   Transportation Needs:   . Film/video editor (Medical):   Marland Kitchen Lack of Transportation (Non-Medical):   Physical Activity:   . Days of Exercise per Week:   . Minutes of Exercise per Session:   Stress:   . Feeling of Stress :   Social Connections:   . Frequency of Communication with Friends and Family:   . Frequency of Social  Gatherings with Friends and Family:   . Attends Religious Services:   . Active Member of Clubs or Organizations:   . Attends Archivist Meetings:   Marland Kitchen Marital Status:   Intimate Partner Violence:   . Fear of Current or Ex-Partner:   . Emotionally Abused:   Marland Kitchen Physically Abused:   . Sexually Abused:    Allergies  Allergen Reactions  . Ceftriaxone Rash    Last set of Vital Signs (not current) Vitals:   10/15/2019 1926 10/15/2019 1931  BP:    Pulse: (!) 135 (!) 138  Resp: (!) 50 (!) 31  Temp:    SpO2: 98% 96%      Physical Exam  Gen: unresponsive Cardiovascular: pulseless  Resp: apneic. Breath sounds equal bilaterally with bagging  Abd: nondistended  Neuro: GCS 3, unresponsive to pain  HEENT: No blood in posterior pharynx, frothy secretions Neck: No crepitus  Musculoskeletal: No deformity  Skin: warm  Procedures (when applicable, including Critical Care time): Procedure Name: Intubation Date/Time: 10/15/19 7:44 PM Performed by: Lilia Pro., MD Pre-anesthesia Checklist: Emergency Drugs available, Suction available and Patient being monitored Oxygen Delivery Method: Ambu bag Laryngoscope Size: Glidescope and 4 Tube size: 8.0 mm Number of attempts: 1 Placement Confirmation: ETT inserted through vocal cords under direct vision,  Positive ETCO2 and Breath sounds checked- equal and bilateral Comments: Intubated first pass during CPR without difficulty. No medications required. Copious frothy secretions from ETT tube noted after successful placement. Oxygen saturation high 90s with bagging and adequate compressions.     CPR  Date/Time: October 15, 2019 7:54 PM Performed by: Lilia Pro., MD Authorized by: Lilia Pro., MD  CPR Procedure Details:    CPR/ACLS performed in the ED: Yes     Duration of CPR (minutes):  23   Outcome: Pt declared dead    CPR performed via ACLS guidelines under my direct supervision.  See RN documentation for details including  defibrillator use, medications, doses and timing.     MDM / Assessment and Plan  86 year old male admitted to the ICU, boarding in ED due to bed availability, for urosepsis.  Responded to [place] for patient who quickly developed acute clinical change, including tachycardic and respiratory distress, ultimately became cyanotic and pulseless.  Code initiated at 1913, suspect likely respiratory versus severe sepsis.  Patient received multiple rounds of epinephrine, multiple rounds of calcium and bicarb.  Patient in PEA and asystole throughout code, never had a shockable rhythm.  Intubated first pass without difficulty (see procedure note above) with oxygen saturation in the high 90s with bagging and adequate compressions.  Despite exhaustive efforts, including intubation, multiple rounds of  medications, patient never had return of spontaneous circulation. Patient had been coding > 20 minutes without any change in clinical status, further efforts felt to be futile. Bedside RNs, charge RN, and ICU APP in agreement to stop code due to futility.  Final pulse check 1936, PEA, time of death called.  ICU APP aware and at bedside, will discuss patient's care with the wife who is on her way to the hospital at this time.     Lilia Pro., MD November 11, 2019 Karl Bales

## 2019-10-18 NOTE — Progress Notes (Addendum)
Patient ID: Jeffery [name], male   DOB: [birth year], 86 y.o.   MRN: [id number]  Patient sister-in-law Jeffery [name] states she has this patient's power of attorney.  Her number is [phone]  I spoke with Dr. Voncille Lo and his wife Jeffery [name].  I gave an update.  Jeffery [name] states that she is the primary contact at this number because she has the power of attorney.  Dr Delfino Lovett Leslye Peer  Addendum:  Spoke with patient advocate Jeffery [name].  The wife was contacted and she still wants to be the primary contact.  She is unaware of them having the power of attorney.  At this point we will contact the patient's wife as the primary contact.  We do not have the power of attorney on file here at this hospital at this point.  Dr Loletha Grayer

## 2019-10-18 DEATH — deceased
